# Patient Record
Sex: Male | Born: 1975 | Race: White | Hispanic: No | Marital: Married | Smoking: Former smoker
Health system: Southern US, Community
[De-identification: ages and names within clinical notes are randomized; demographics above are authoritative.]

## PROBLEM LIST (undated history)

## (undated) DIAGNOSIS — C187 Malignant neoplasm of sigmoid colon: Secondary | ICD-10-CM

## (undated) DIAGNOSIS — A389 Scarlet fever, uncomplicated: Secondary | ICD-10-CM

## (undated) DIAGNOSIS — E119 Type 2 diabetes mellitus without complications: Secondary | ICD-10-CM

## (undated) DIAGNOSIS — Z800 Family history of malignant neoplasm of digestive organs: Secondary | ICD-10-CM

## (undated) HISTORY — DX: Malignant neoplasm of sigmoid colon: C18.7

## (undated) HISTORY — DX: Type 2 diabetes mellitus without complications: E11.9

## (undated) HISTORY — DX: Family history of malignant neoplasm of digestive organs: Z80.0

## (undated) HISTORY — PX: VASECTOMY: SHX75

## (undated) HISTORY — DX: Scarlet fever, uncomplicated: A38.9

---

## 2002-07-09 ENCOUNTER — Emergency Department (HOSPITAL_COMMUNITY): Admission: EM | Admit: 2002-07-09 | Discharge: 2002-07-09 | Payer: Self-pay | Admitting: Emergency Medicine

## 2002-07-09 ENCOUNTER — Encounter: Payer: Self-pay | Admitting: Emergency Medicine

## 2004-02-19 ENCOUNTER — Emergency Department (HOSPITAL_COMMUNITY): Admission: EM | Admit: 2004-02-19 | Discharge: 2004-02-19 | Payer: Self-pay | Admitting: Family Medicine

## 2009-06-20 ENCOUNTER — Encounter: Admission: RE | Admit: 2009-06-20 | Discharge: 2009-06-20 | Payer: Self-pay | Admitting: Family Medicine

## 2014-12-16 ENCOUNTER — Emergency Department (HOSPITAL_COMMUNITY): Payer: PRIVATE HEALTH INSURANCE

## 2014-12-16 ENCOUNTER — Emergency Department (HOSPITAL_COMMUNITY)
Admission: EM | Admit: 2014-12-16 | Discharge: 2014-12-16 | Disposition: A | Discharge status: Home / Self Care | Payer: PRIVATE HEALTH INSURANCE | Attending: Emergency Medicine | Admitting: Emergency Medicine

## 2014-12-16 ENCOUNTER — Encounter (HOSPITAL_COMMUNITY): Payer: Self-pay | Admitting: Emergency Medicine

## 2014-12-16 DIAGNOSIS — K5732 Diverticulitis of large intestine without perforation or abscess without bleeding: Secondary | ICD-10-CM | POA: Insufficient documentation

## 2014-12-16 DIAGNOSIS — R1031 Right lower quadrant pain: Secondary | ICD-10-CM

## 2014-12-16 LAB — COMPREHENSIVE METABOLIC PANEL
ALBUMIN: 3.7 g/dL (ref 3.5–5.0)
ALK PHOS: 46 U/L (ref 38–126)
ALT: 17 U/L (ref 17–63)
AST: 25 U/L (ref 15–41)
Anion gap: 9 (ref 5–15)
BUN: 9 mg/dL (ref 6–20)
CALCIUM: 9 mg/dL (ref 8.9–10.3)
CO2: 24 mmol/L (ref 22–32)
CREATININE: 0.93 mg/dL (ref 0.61–1.24)
Chloride: 102 mmol/L (ref 101–111)
GFR calc Af Amer: >60 mL/min (ref >60)
GFR calc non Af Amer: >60 mL/min (ref >60)
GLUCOSE: 136 mg/dL — AB (ref 65–99)
Potassium: 3.8 mmol/L (ref 3.5–5.1)
SODIUM: 135 mmol/L (ref 135–145)
Total Bilirubin: 1.3 mg/dL — ABNORMAL HIGH (ref 0.3–1.2)
Total Protein: 7.1 g/dL (ref 6.5–8.1)

## 2014-12-16 LAB — URINALYSIS, ROUTINE W REFLEX MICROSCOPIC
Bilirubin Urine: NEGATIVE
Glucose, UA: NEGATIVE mg/dL
Hgb urine dipstick: NEGATIVE
Ketones, ur: NEGATIVE mg/dL
Leukocytes, UA: NEGATIVE
Nitrite: NEGATIVE
Protein, ur: NEGATIVE mg/dL
Specific Gravity, Urine: 1.027 (ref 1.005–1.030)
Urobilinogen, UA: 1 mg/dL (ref 0.0–1.0)
pH: 6 (ref 5.0–8.0)

## 2014-12-16 LAB — CBC WITH DIFFERENTIAL/PLATELET
Basophils Absolute: 0 10*3/uL (ref 0.0–0.1)
Basophils Relative: 0 %
Eosinophils Absolute: 0 10*3/uL (ref 0.0–0.7)
Eosinophils Relative: 0 %
HCT: 44.2 % (ref 39.0–52.0)
Hemoglobin: 15.5 g/dL (ref 13.0–17.0)
Lymphocytes Relative: 6 %
Lymphs Abs: 0.7 10*3/uL (ref 0.7–4.0)
MCH: 30.8 pg (ref 26.0–34.0)
MCHC: 35.1 g/dL (ref 30.0–36.0)
MCV: 87.9 fL (ref 78.0–100.0)
Monocytes Absolute: 0.8 10*3/uL (ref 0.1–1.0)
Monocytes Relative: 7 %
Neutro Abs: 9.6 10*3/uL — ABNORMAL HIGH (ref 1.7–7.7)
Neutrophils Relative %: 87 %
Platelets: 215 10*3/uL (ref 150–400)
RBC: 5.03 MIL/uL (ref 4.22–5.81)
RDW: 12.7 % (ref 11.5–15.5)
WBC: 11.1 10*3/uL — ABNORMAL HIGH (ref 4.0–10.5)

## 2014-12-16 LAB — LIPASE, BLOOD: Lipase: 37 U/L (ref 22–51)

## 2014-12-16 MED ORDER — ONDANSETRON HCL 4 MG PO TABS: 4.0000 mg | ORAL_TABLET | Freq: Four times a day (QID) | ORAL | Status: DC

## 2014-12-16 MED ORDER — ONDANSETRON HCL 4 MG/2ML IJ SOLN
4.0000 mg | Freq: Once | INTRAMUSCULAR | Status: AC
  Administered 2014-12-16: 4 mg via INTRAVENOUS
  Filled 2014-12-16: qty 2

## 2014-12-16 MED ORDER — METRONIDAZOLE 500 MG PO TABS
500.0000 mg | ORAL_TABLET | Freq: Once | ORAL | Status: AC
  Administered 2014-12-16: 500 mg via ORAL
  Filled 2014-12-16: qty 1

## 2014-12-16 MED ORDER — IOHEXOL 300 MG/ML  SOLN
100.0000 mL | Freq: Once | INTRAMUSCULAR | Status: AC | PRN
  Administered 2014-12-16: 100 mL via INTRAVENOUS

## 2014-12-16 MED ORDER — CIPROFLOXACIN HCL 500 MG PO TABS: 500.0000 mg | ORAL_TABLET | Freq: Two times a day (BID) | ORAL | Status: DC

## 2014-12-16 MED ORDER — METRONIDAZOLE 500 MG PO TABS: 500.0000 mg | ORAL_TABLET | Freq: Two times a day (BID) | ORAL | Status: DC

## 2014-12-16 MED ORDER — CIPROFLOXACIN HCL 500 MG PO TABS
500.0000 mg | ORAL_TABLET | Freq: Once | ORAL | Status: AC
  Administered 2014-12-16: 500 mg via ORAL
  Filled 2014-12-16: qty 1

## 2014-12-16 NOTE — Discharge Instructions (Signed)
Diverticulitis Diverticulitis is when small pockets that have formed in your colon (large intestine) become infected or swollen. HOME CARE  Follow your doctor's instructions.  Follow a special diet if told by your doctor.  When you feel better, your doctor may tell you to change your diet. You may be told to eat a lot of fiber. Fruits and vegetables are good sources of fiber. Fiber makes it easier to poop (have bowel movements).  Take supplements or probiotics as told by your doctor.  Only take medicines as told by your doctor.  Keep all follow-up visits with your doctor. GET HELP IF:  Your pain does not get better.  You have a hard time eating food.  You are not pooping like normal. GET HELP RIGHT AWAY IF:  Your pain gets worse.  Your problems do not get better.  Your problems suddenly get worse.  You have a fever.  You keep throwing up (vomiting).  You have bloody or black, tarry poop (stool). MAKE SURE YOU:   Understand these instructions.  Will watch your condition.  Will get help right away if you are not doing well or get worse. Document Released: 09/03/2007 Document Revised: 03/22/2013 Document Reviewed: 02/09/2013 [facility] Patient Information 2015 [place], [place]. This information is not intended to replace advice given to you by your health care provider. Make sure you discuss any questions you have with your health care provider.  Please retest information, follow-up immediately if new or worsening signs or symptoms present. Please follow-up with [facility] in 3 days for reevaluation.

## 2014-12-16 NOTE — ED Notes (Signed)
CT notified patient finished with contrast 

## 2014-12-16 NOTE — ED Notes (Signed)
Pt c/o abdominal pain and nausea onset Tuesday.

## 2014-12-16 NOTE — ED Provider Notes (Signed)
CSN: [id number]     Arrival date & time 12/16/14  1035 History   First MD Initiated Contact with Patient 12/16/14 1102     Chief Complaint  Patient presents with  . Abdominal Pain    HPI   39 year old male with no significant past medical history presents today with right lower quadrant abdominal pain. Patient reports symptoms started on Tuesday ( 5 days ago), with right lower quadrant "pressure". He reports the symptoms began approximately 30 minutes after eating food, or severe and lasted several hours. He reports symptoms subside and is asymptomatic with the exception of certain body movements and palpation of the right lower quadrant. Patient reports he also has significant pain with bowel movements, and urination. Patient states that every time he eats symptoms present. He notes that yesterday he spiked a fever in the low 100s, and has been using Motrin since then. Patient denies any headache, chest pain, upper abdominal pain, vomiting, changes in the color clarity or characteristics of his urine. Patient denies diarrhea or abnormal bowel movements. Patient reports he's never had any symptoms similar to this, no history of abdominal surgeries. Patient takes no daily medications.  History reviewed. No pertinent past medical history. History reviewed. No pertinent past surgical history. No family history on file. Social History  Substance Use Topics  . Smoking status: Never Smoker   . Smokeless tobacco: None  . Alcohol Use: No    Review of Systems  All other systems reviewed and are negative.   Allergies  Review of patient's allergies indicates no known allergies.  Home Medications   Prior to Admission medications   Medication Sig Start Date End Date Taking? Authorizing Wiley Flicker  ibuprofen (ADVIL,MOTRIN) 100 MG chewable tablet Chew 200 mg by mouth every 8 (eight) hours as needed for mild pain.   Yes Historical Jacklyn Branan, MD  ciprofloxacin (CIPRO) 500 MG tablet Take 1 tablet (500 mg  total) by mouth every 12 (twelve) hours. 12/16/14   Okey Regal, PA-C  metroNIDAZOLE (FLAGYL) 500 MG tablet Take 1 tablet (500 mg total) by mouth 2 (two) times daily. 12/16/14   Dellis Filbert Hedges, PA-C  ondansetron (ZOFRAN) 4 MG tablet Take 1 tablet (4 mg total) by mouth every 6 (six) hours. 12/16/14   Jeffrey Hedges, PA-C   BP 109/78 mmHg  Pulse 80  Temp(Src) 99.5 F (37.5 C) (Oral)  Resp 18  Ht 5\' 11"  (1.803 m)  Wt 228 lb (103.42 kg)  BMI 31.81 kg/m2  SpO2 100%   Physical Exam  Constitutional: He is oriented to person, place, and time. He appears well-developed and well-nourished.  HENT:  Head: Normocephalic and atraumatic.  Eyes: Conjunctivae are normal. Pupils are equal, round, and reactive to light. Right eye exhibits no discharge. Left eye exhibits no discharge. No scleral icterus.  Neck: Normal range of motion. No JVD present. No tracheal deviation present.  Cardiovascular: Regular rhythm, normal heart sounds and intact distal pulses.  Exam reveals no gallop and no friction rub.   No murmur heard. Pulmonary/Chest: Effort normal and breath sounds normal. No stridor. No respiratory distress. He has no wheezes. He has no rales. He exhibits no tenderness.  Abdominal: Soft. Bowel sounds are normal. He exhibits no distension and no mass. There is no hepatosplenomegaly, splenomegaly or hepatomegaly. There is tenderness in the right lower quadrant. There is tenderness at McBurney's point. There is no rigidity, no rebound, no guarding, no CVA tenderness and negative Murphy's sign. No hernia.  Neurological: He is alert and oriented to  person, place, and time. Coordination normal.  Skin: Skin is warm and dry.  Psychiatric: He has a normal mood and affect. His behavior is normal. Judgment and thought content normal.  Nursing note and vitals reviewed.   ED Course  Procedures (including critical care time) Labs Review Labs Reviewed  COMPREHENSIVE METABOLIC PANEL - Abnormal; Notable for the  following:    Glucose, Bld 136 (*)    Total Bilirubin 1.3 (*)    All other components within normal limits  CBC WITH DIFFERENTIAL/PLATELET - Abnormal; Notable for the following:    WBC 11.1 (*)    Neutro Abs 9.6 (*)    All other components within normal limits  LIPASE, BLOOD  URINALYSIS, ROUTINE W REFLEX MICROSCOPIC (NOT AT [facility])    Imaging Review Ct Abdomen Pelvis W Contrast  12/16/2014   CLINICAL DATA:  Right lower quadrant pain starting Tuesday, fever, nausea  EXAM: CT ABDOMEN AND PELVIS WITH CONTRAST  TECHNIQUE: Multidetector CT imaging of the abdomen and pelvis was performed using the standard protocol following bolus administration of intravenous contrast.  CONTRAST:  171mL OMNIPAQUE IOHEXOL 300 MG/ML  SOLN  COMPARISON:  None.  FINDINGS: Lung bases are unremarkable. Sagittal images of the spine shows degenerative changes thoracolumbar spine.  Liver, spleen, pancreas and adrenal glands are unremarkable. No aortic aneurysm. No calcified gallstones are noted within gallbladder.  Kidneys are symmetrical in size and enhancement. No hydronephrosis or hydroureter. There is a cyst in midpole of the right kidney measures 8 mm. Punctate nonobstructive calculus midpole of the left kidney measures 2.5 mm. Normal appendix noted in axial image 44. No pericecal inflammation. No small bowel obstruction. No ascites or free air. No adenopathy.  In axial image 80 there is focal thickening of colonic wall in mid sigmoid colon just above the urinary bladder. Significant stranding of surrounding fat. Findings highly suspicious for focal colitis or diverticulitis. Neoplastic process cannot be entirely excluded. Follow-up colonoscopy after appropriate treatment is recommended. A lymph node in adjacent mesentery right pelvis measures 1.2 cm. This may be reactive in nature. Second lymph node in right mesentery measures 9 mm.  Delayed renal images shows bilateral renal symmetrical excretion. Bilateral visualized proximal  ureter is unremarkable. The urinary bladder is unremarkable. Prostate gland and seminal vesicles are unremarkable.  IMPRESSION: 1. There is focal inflammatory process in mid sigmoid colon just above the urinary bladder. There is focal thickening of colonic wall and stranding of surrounding fat. Borderline enlarged adjacent mesenteric lymph node. Small amount of adjacent pericolonic fluid. Findings are highly suspicious for focal colitis or diverticulitis. Neoplastic process cannot be entirely excluded. Follow-up colonoscopy after appropriate treatment is recommended. 2. Normal appendix.  No pericecal inflammation. 3. Unremarkable urinary bladder.  Prostate gland is unremarkable. 4. There is left nonobstructive nephrolithiasis. No hydronephrosis or hydroureter.   Electronically Signed   By: Lahoma Crocker M.D.   On: 12/16/2014 14:00   I have personally reviewed and evaluated these images and lab results as part of my medical decision-making.   EKG Interpretation None      MDM   Final diagnoses:  RLQ abdominal pain  Diverticulitis of large intestine without perforation or abscess without bleeding    Labs: CBC, CMP, urinalysis- no significant findings  Imaging: CT abdomen and pelvis for results shown above- most likely represents diverticulitis without perforation or abscess  Consults:  Therapeutics: Zofran, metronidazole, Cipro  Discharge Meds: Zofran, metronidazole, Cipro  Assessment/Plan: Patient's presentation most likely represents diverticulitis without perforation or abscess. Patient will  be discharged home with oral medications, he tolerated by mouth antibiotics here in the ED. He is nontoxic, afebrile at present, vital signs are reassuring. Patient is instructed to take full course of medication, monitor for new worsening signs or symptoms, return immediately if any present. Is encouraged contact [facility] Monday morning to schedule follow-up evaluation and 3-5 days.  Patient and his wife both verbalized their understanding and agreement for today's plan and had no further questions or concerns at the time of discharge.         Okey Regal, PA-C 12/16/14 [place], MD 12/16/14 [phone]

## 2015-07-05 ENCOUNTER — Encounter (HOSPITAL_COMMUNITY): Payer: Self-pay | Admitting: Emergency Medicine

## 2015-07-05 ENCOUNTER — Emergency Department (HOSPITAL_COMMUNITY)
Admission: EM | Admit: 2015-07-05 | Discharge: 2015-07-05 | Disposition: A | Discharge status: Home / Self Care | Payer: PRIVATE HEALTH INSURANCE | Attending: Emergency Medicine | Admitting: Emergency Medicine

## 2015-07-05 ENCOUNTER — Emergency Department (HOSPITAL_COMMUNITY): Payer: PRIVATE HEALTH INSURANCE

## 2015-07-05 DIAGNOSIS — R109 Unspecified abdominal pain: Secondary | ICD-10-CM | POA: Diagnosis present

## 2015-07-05 DIAGNOSIS — M545 Low back pain: Secondary | ICD-10-CM | POA: Diagnosis not present

## 2015-07-05 DIAGNOSIS — K5732 Diverticulitis of large intestine without perforation or abscess without bleeding: Secondary | ICD-10-CM

## 2015-07-05 DIAGNOSIS — Z792 Long term (current) use of antibiotics: Secondary | ICD-10-CM | POA: Insufficient documentation

## 2015-07-05 LAB — COMPREHENSIVE METABOLIC PANEL
ALK PHOS: 42 U/L (ref 38–126)
ALT: 16 U/L — ABNORMAL LOW (ref 17–63)
ANION GAP: 10 (ref 5–15)
AST: 18 U/L (ref 15–41)
Albumin: 3.4 g/dL — ABNORMAL LOW (ref 3.5–5.0)
BUN: 8 mg/dL (ref 6–20)
CALCIUM: 9.3 mg/dL (ref 8.9–10.3)
CHLORIDE: 100 mmol/L — AB (ref 101–111)
CO2: 28 mmol/L (ref 22–32)
Creatinine, Ser: 0.92 mg/dL (ref 0.61–1.24)
GFR calc non Af Amer: >60 mL/min (ref >60)
Glucose, Bld: 172 mg/dL — ABNORMAL HIGH (ref 65–99)
POTASSIUM: 3.9 mmol/L (ref 3.5–5.1)
SODIUM: 138 mmol/L (ref 135–145)
Total Bilirubin: 0.9 mg/dL (ref 0.3–1.2)
Total Protein: 7.2 g/dL (ref 6.5–8.1)

## 2015-07-05 LAB — CBC
HCT: 40.4 % (ref 39.0–52.0)
HEMOGLOBIN: 13.5 g/dL (ref 13.0–17.0)
MCH: 28.3 pg (ref 26.0–34.0)
MCHC: 33.4 g/dL (ref 30.0–36.0)
MCV: 84.7 fL (ref 78.0–100.0)
Platelets: 297 10*3/uL (ref 150–400)
RBC: 4.77 MIL/uL (ref 4.22–5.81)
RDW: 12.6 % (ref 11.5–15.5)
WBC: 11 10*3/uL — ABNORMAL HIGH (ref 4.0–10.5)

## 2015-07-05 LAB — URINALYSIS, ROUTINE W REFLEX MICROSCOPIC
Bilirubin Urine: NEGATIVE
Glucose, UA: NEGATIVE mg/dL
HGB URINE DIPSTICK: NEGATIVE
Ketones, ur: NEGATIVE mg/dL
Leukocytes, UA: NEGATIVE
NITRITE: NEGATIVE
Protein, ur: NEGATIVE mg/dL
SPECIFIC GRAVITY, URINE: 1.005 (ref 1.005–1.030)
pH: 6.5 (ref 5.0–8.0)

## 2015-07-05 LAB — LIPASE, BLOOD: LIPASE: 26 U/L (ref 11–51)

## 2015-07-05 MED ORDER — IOPAMIDOL (ISOVUE-300) INJECTION 61%
100.0000 mL | Freq: Once | INTRAVENOUS | Status: AC | PRN
  Administered 2015-07-05: 100 mL via INTRAVENOUS

## 2015-07-05 MED ORDER — SODIUM CHLORIDE 0.9 % IV BOLUS (SEPSIS)
1000.0000 mL | Freq: Once | INTRAVENOUS | Status: AC
  Administered 2015-07-05: 1000 mL via INTRAVENOUS

## 2015-07-05 MED ORDER — CIPROFLOXACIN HCL 500 MG PO TABS: 500.0000 mg | ORAL_TABLET | Freq: Two times a day (BID) | ORAL | Status: DC

## 2015-07-05 MED ORDER — ACETAMINOPHEN 325 MG PO TABS
650.0000 mg | ORAL_TABLET | Freq: Once | ORAL | Status: AC
  Administered 2015-07-05: 650 mg via ORAL
  Filled 2015-07-05: qty 2

## 2015-07-05 MED ORDER — METRONIDAZOLE 500 MG PO TABS
500.0000 mg | ORAL_TABLET | Freq: Once | ORAL | Status: AC
  Administered 2015-07-05: 500 mg via ORAL
  Filled 2015-07-05: qty 1

## 2015-07-05 MED ORDER — CIPROFLOXACIN HCL 500 MG PO TABS
500.0000 mg | ORAL_TABLET | Freq: Once | ORAL | Status: AC
  Administered 2015-07-05: 500 mg via ORAL
  Filled 2015-07-05: qty 1

## 2015-07-05 MED ORDER — METRONIDAZOLE 500 MG PO TABS: 500.0000 mg | ORAL_TABLET | Freq: Two times a day (BID) | ORAL | Status: DC

## 2015-07-05 NOTE — ED Notes (Signed)
Provider at bedside updating the patient

## 2015-07-05 NOTE — ED Notes (Signed)
Pt reports mid lower back pain x 3 days followed by RLQ abd pain. Pt alert x4. NAD at this time.

## 2015-07-05 NOTE — ED Provider Notes (Signed)
CSN: [id number]     Arrival date & time 07/05/15  1507 History   First MD Initiated Contact with Patient 07/05/15 1954     Chief Complaint  Patient presents with  . Back Pain  . Abdominal Pain     (Consider location/radiation/quality/duration/timing/severity/associated sxs/prior Treatment) HPI  40 year old male presents with abdominal pain. Patient states he started having low back pain across his entire low back 4 days ago. This morning when he woke up he also had concomitant abdominal pain. Today he has been having some nausea but no vomiting. Had a low-grade temperature of 99.7. When he went to urgent care he said the pain was worst in his right lower abdomen. Has had diarrhea on and off for about one week. No urinary symptoms. Currently pain is 5/10. Took a leave earlier and seemed to help.   History reviewed. No pertinent past medical history. History reviewed. No pertinent past surgical history. No family history on file. Social History  Substance Use Topics  . Smoking status: Never Smoker   . Smokeless tobacco: None  . Alcohol Use: No    Review of Systems  Constitutional: Negative for fever.  Gastrointestinal: Positive for nausea, abdominal pain and diarrhea. Negative for vomiting.  Musculoskeletal: Positive for back pain.  All other systems reviewed and are negative.     Allergies  Review of patient's allergies indicates no known allergies.  Home Medications   Prior to Admission medications   Medication Sig Start Date End Date Taking? Authorizing Provider  ciprofloxacin (CIPRO) 500 MG tablet Take 1 tablet (500 mg total) by mouth every 12 (twelve) hours. 12/16/14   Okey Regal, PA-C  ibuprofen (ADVIL,MOTRIN) 100 MG chewable tablet Chew 200 mg by mouth every 8 (eight) hours as needed for mild pain.    Historical Provider, MD  metroNIDAZOLE (FLAGYL) 500 MG tablet Take 1 tablet (500 mg total) by mouth 2 (two) times daily. 12/16/14   Dellis Filbert Hedges, PA-C  ondansetron  (ZOFRAN) 4 MG tablet Take 1 tablet (4 mg total) by mouth every 6 (six) hours. 12/16/14   Jeffrey Hedges, PA-C   BP 124/89 mmHg  Pulse 86  Temp(Src) 98.5 F (36.9 C) (Oral)  Resp 16  Ht 5\' 11"  (1.803 m)  Wt 251 lb (113.853 kg)  BMI 35.02 kg/m2  SpO2 100% Physical Exam  Constitutional: He is oriented to person, place, and time. He appears well-developed and well-nourished.  HENT:  Head: Normocephalic and atraumatic.  Right Ear: External ear normal.  Left Ear: External ear normal.  Nose: Nose normal.  Eyes: Right eye exhibits no discharge. Left eye exhibits no discharge.  Neck: Neck supple.  Cardiovascular: Normal rate, regular rhythm, normal heart sounds and intact distal pulses.   Pulmonary/Chest: Effort normal and breath sounds normal.  Abdominal: Soft. There is tenderness in the right upper quadrant and right lower quadrant.  Musculoskeletal: He exhibits no edema.  No lower back tenderness  Neurological: He is alert and oriented to person, place, and time.  Skin: Skin is warm and dry.  Nursing note and vitals reviewed.   ED Course  Procedures (including critical care time) Labs Review Labs Reviewed  COMPREHENSIVE METABOLIC PANEL - Abnormal; Notable for the following:    Chloride 100 (*)    Glucose, Bld 172 (*)    Albumin 3.4 (*)    ALT 16 (*)    All other components within normal limits  CBC - Abnormal; Notable for the following:    WBC 11.0 (*)  All other components within normal limits  LIPASE, BLOOD  URINALYSIS, ROUTINE W REFLEX MICROSCOPIC (NOT AT [facility])    Imaging Review Ct Abdomen Pelvis W Contrast  07/05/2015  CLINICAL DATA:  Right-sided abdominal pain, onset this morning. EXAM: CT ABDOMEN AND PELVIS WITH CONTRAST TECHNIQUE: Multidetector CT imaging of the abdomen and pelvis was performed using the standard protocol following bolus administration of intravenous contrast. CONTRAST:  164mL ISOVUE-300 IOPAMIDOL (ISOVUE-300) INJECTION 61% COMPARISON:  None.  FINDINGS: Lower chest:  No significant abnormality Hepatobiliary: There are normal appearances of the liver, gallbladder and bile ducts. Pancreas: Normal Spleen: Normal Adrenals/Urinary Tract: The adrenals are normal. There is an unchanged 9 mm cyst at the lateral aspect of the right renal midpole. There are punctate calculi of both renal collecting systems, measuring about 2 mm. Ureters and urinary bladder are unremarkable. Stomach/Bowel: Stomach and small bowel are normal. Appendix is normal. There is marked mural thickening of a 9 cm segment of distal sigmoid colon with adjacent mass-like stranding in the sigmoid mesentery and with numerous prominent local nodes. This is the same location which was abnormal on the 12/16/2014 examination. Today, the mural thickening is much more prominent and irregular, and the adjacent soft tissue abnormality in the mesentery is more mass-like. A few prominent nodes are also present in the para-aortic retroperitoneum and in the porta hepatis. There is no bowel obstruction. There is no free air. There is no ascites. Vascular/Lymphatic: The abdominal aorta is normal in caliber. There is no atherosclerotic calcification. There is no adenopathy in the abdomen or pelvis. Reproductive: Unremarkable Other: Musculoskeletal: No significant skeletal lesions IMPRESSION: 1. Markedly abnormal 9 cm segment of distal sigmoid colon, with adjacent soft tissue mass or cicatrization in the mesentery. Local nodes are enlarged, and there are prominent nodes in the paraaortic and porta hepatis regions. Although this might represent complicated sigmoid diverticulitis, it is quite suspicious for neoplasm. Carcinoid or lymphoma should be considered in addition to adenocarcinoma. 2. No obstruction, free air, or ascites.  No drainable abscess. 3. No hepatic or lung base metastases. 4. These results were called by telephone at the time of interpretation on 07/05/2015 at 9:46 pm to Dr. Sherwood Gambler , who  verbally acknowledged these results. Electronically Signed   By: Andreas Newport M.D.   On: 07/05/2015 21:57   I have personally reviewed and evaluated these images and lab results as part of my medical decision-making.   EKG Interpretation None      MDM   Final diagnoses:  Sigmoid diverticulitis    CT shows recurrent sigmoid diverticulitis. Patient is quite well-appearing with normal vital signs. Moderate pain but he requests nothing stronger than Tylenol at this time. His pain seems to be well controlled. Radiology is concerned about a possible mass causing this. He had similar diverticulitis about one year ago. I discussed with GI on call, Dr. Carlean Purl, who will follow the patient up as an outpatient. Discussed the importance of follow-up and colonoscopy with patient and will give oral antibiotics. He requests no narcotics and wants to be treated with Tylenol and ibuprofen. Discussed strict return precautions. Discussed concern of possible mass with patient.    Sherwood Gambler, MD 07/06/15 [phone]

## 2015-07-05 NOTE — ED Notes (Signed)
Patient able to ambulate independently  

## 2015-07-05 NOTE — Discharge Instructions (Signed)
It is very important to follow up with the GI specialist (Dr. Carlean Purl). Return to the ER if your symptoms worsen or do not improve

## 2015-07-06 ENCOUNTER — Telehealth: Payer: Self-pay | Admitting: Internal Medicine

## 2015-07-06 NOTE — Telephone Encounter (Signed)
Patient called and left a voicemail confirming appt for Monday

## 2015-07-06 NOTE — Telephone Encounter (Signed)
I placed him on the schedule for Monday 07/09/15 9:00 with Dr. Fuller Plan I left a detailed message about the appt.  I asked that he call to confirm or call me to reschedule.

## 2015-07-06 NOTE — Telephone Encounter (Signed)
Mass on CT needs appt w/ [place] - sigmoid mass

## 2015-07-09 ENCOUNTER — Encounter: Payer: Self-pay | Admitting: Gastroenterology

## 2015-07-09 ENCOUNTER — Ambulatory Visit (INDEPENDENT_AMBULATORY_CARE_PROVIDER_SITE_OTHER): Payer: PRIVATE HEALTH INSURANCE | Admitting: Gastroenterology

## 2015-07-09 VITALS — BP 118/80 | HR 76 | Ht 70.5 in | Wt 251.0 lb

## 2015-07-09 DIAGNOSIS — K5732 Diverticulitis of large intestine without perforation or abscess without bleeding: Secondary | ICD-10-CM

## 2015-07-09 DIAGNOSIS — R933 Abnormal findings on diagnostic imaging of other parts of digestive tract: Secondary | ICD-10-CM

## 2015-07-09 MED ORDER — NA SULFATE-K SULFATE-MG SULF 17.5-3.13-1.6 GM/177ML PO SOLN: 1.0000 | Freq: Once | ORAL | Status: DC

## 2015-07-09 MED ORDER — CIPROFLOXACIN HCL 500 MG PO TABS: 500.0000 mg | ORAL_TABLET | Freq: Two times a day (BID) | ORAL | Status: DC

## 2015-07-09 MED ORDER — METRONIDAZOLE 500 MG PO TABS: 500.0000 mg | ORAL_TABLET | Freq: Two times a day (BID) | ORAL | Status: DC

## 2015-07-09 NOTE — Patient Instructions (Signed)
We have sent the following medications to your pharmacy for you to pick up at your convenience:Cipro and Flagyl.  You have been scheduled for a colonoscopy. Please follow written instructions given to you at your visit today.  Please pick up your prep supplies at the pharmacy within the next 1-3 days. If you use inhalers (even only as needed), please bring them with you on the day of your procedure. Your physician has requested that you go to www.startemmi.com and enter the access code given to you at your visit today. This web site gives a general overview about your procedure. However, you should still follow specific instructions given to you by our office regarding your preparation for the procedure.  We have referred you to [facility] to see Dr. Darron Doom on 07/11/15 at 1:15pm. Please bring your insurance cards, photo ID and medication list. If you need to reschedule or cancel please contact there office at [phone].  Diverticulitis Diverticulitis is inflammation or infection of small pouches in your colon that form when you have a condition called diverticulosis. The pouches in your colon are called diverticula. Your colon, or large intestine, is where water is absorbed and stool is formed. Complications of diverticulitis can include:  Bleeding.  Severe infection.  Severe pain.  Perforation of your colon.  Obstruction of your colon. CAUSES  Diverticulitis is caused by bacteria. Diverticulitis happens when stool becomes trapped in diverticula. This allows bacteria to grow in the diverticula, which can lead to inflammation and infection. RISK FACTORS People with diverticulosis are at risk for diverticulitis. Eating a diet that does not include enough fiber from fruits and vegetables may make diverticulitis more likely to develop. SYMPTOMS  Symptoms of diverticulitis may include:  Abdominal pain and tenderness. The pain is normally located on the left side of the  abdomen, but may occur in other areas.  Fever and chills.  Bloating.  Cramping.  Nausea.  Vomiting.  Constipation.  Diarrhea.  Blood in your stool. DIAGNOSIS  Your health care provider will ask you about your medical history and do a physical exam. You may need to have tests done because many medical conditions can cause the same symptoms as diverticulitis. Tests may include:  Blood tests.  Urine tests.  Imaging tests of the abdomen, including X-rays and CT scans. When your condition is under control, your health care provider may recommend that you have a colonoscopy. A colonoscopy can show how severe your diverticula are and whether something else is causing your symptoms. TREATMENT  Most cases of diverticulitis are mild and can be treated at home. Treatment may include:  Taking over-the-counter pain medicines.  Following a clear liquid diet.  Taking antibiotic medicines by mouth for 7-10 days. More severe cases may be treated at a hospital. Treatment may include:  Not eating or drinking.  Taking prescription pain medicine.  Receiving antibiotic medicines through an IV tube.  Receiving fluids and nutrition through an IV tube.  Surgery. HOME CARE INSTRUCTIONS   Follow your health care provider's instructions carefully.  Follow a full liquid diet or other diet as directed by your health care provider. After your symptoms improve, your health care provider may tell you to change your diet. He or she may recommend you eat a high-fiber diet. Fruits and vegetables are good sources of fiber. Fiber makes it easier to pass stool.  Take fiber supplements or probiotics as directed by your health care provider.  Only take medicines as directed by your health  care provider.  Keep all your follow-up appointments. SEEK MEDICAL CARE IF:   Your pain does not improve.  You have a hard time eating food.  Your bowel movements do not return to normal. SEEK IMMEDIATE  MEDICAL CARE IF:   Your pain becomes worse.  Your symptoms do not get better.  Your symptoms suddenly get worse.  You have a fever.  You have repeated vomiting.  You have bloody or black, tarry stools. MAKE SURE YOU:   Understand these instructions.  Will watch your condition.  Will get help right away if you are not doing well or get worse.   This information is not intended to replace advice given to you by your health care provider. Make sure you discuss any questions you have with your health care provider.   Document Released: 12/25/2004 Document Revised: 03/22/2013 Document Reviewed: 02/09/2013 Elsevier Interactive Patient Education 2016 Elsevier Inc.   High-Fiber Diet Fiber, also called dietary fiber, is a type of carbohydrate found in fruits, vegetables, whole grains, and beans. A high-fiber diet can have many health benefits. Your health care provider may recommend a high-fiber diet to help:  Prevent constipation. Fiber can make your bowel movements more regular.  Lower your cholesterol.  Relieve hemorrhoids, uncomplicated diverticulosis, or irritable bowel syndrome.  Prevent overeating as part of a weight-loss plan.  Prevent heart disease, type 2 diabetes, and certain cancers. WHAT IS MY PLAN? The recommended daily intake of fiber includes:  38 grams for men under age 85.  51 grams for men over age 51.  64 grams for women under age 19.  69 grams for women over age 13. You can get the recommended daily intake of dietary fiber by eating a variety of fruits, vegetables, grains, and beans. Your health care provider may also recommend a fiber supplement if it is not possible to get enough fiber through your diet. WHAT DO I NEED TO KNOW ABOUT A HIGH-FIBER DIET?  Fiber supplements have not been widely studied for their effectiveness, so it is better to get fiber through food sources.  Always check the fiber content on thenutrition facts label of any  prepackaged food. Look for foods that contain at least 5 grams of fiber per serving.  Ask your dietitian if you have questions about specific foods that are related to your condition, especially if those foods are not listed in the following section.  Increase your daily fiber consumption gradually. Increasing your intake of dietary fiber too quickly may cause bloating, cramping, or gas.  Drink plenty of water. Water helps you to digest fiber. WHAT FOODS CAN I EAT? Grains Whole-grain breads. Multigrain cereal. Oats and oatmeal. Brown rice. Barley. Bulgur wheat. [place]. Bran muffins. Popcorn. Rye wafer crackers. Vegetables Sweet potatoes. Spinach. Kale. Artichokes. Cabbage. Broccoli. Green peas. Carrots. Squash. Fruits Berries. Pears. Apples. Oranges. Avocados. Prunes and raisins. Dried figs. Meats and Other Protein Sources Navy, kidney, pinto, and soy beans. Split peas. Lentils. Nuts and seeds. Dairy Fiber-fortified yogurt. Beverages Fiber-fortified soy milk. Fiber-fortified orange juice. Other Fiber bars. The items listed above may not be a complete list of recommended foods or beverages. Contact your dietitian for more options. WHAT FOODS ARE NOT RECOMMENDED? Grains White bread. Pasta made with refined flour. White rice. Vegetables Fried potatoes. Canned vegetables. Well-cooked vegetables.  Fruits Fruit juice. Cooked, strained fruit. Meats and Other Protein Sources Fatty cuts of meat. Fried Sales executive or fried fish. Dairy Milk. Yogurt. Cream cheese. Sour cream. Beverages Soft drinks. Other Cakes and pastries.  Butter and oils. The items listed above may not be a complete list of foods and beverages to avoid. Contact your dietitian for more information. WHAT ARE SOME TIPS FOR INCLUDING HIGH-FIBER FOODS IN MY DIET?  Eat a wide variety of high-fiber foods.  Make sure that half of all grains consumed each day are whole grains.  Replace breads and cereals made from refined flour  or white flour with whole-grain breads and cereals.  Replace white rice with brown rice, bulgur wheat, or millet.  Start the day with a breakfast that is high in fiber, such as a cereal that contains at least 5 grams of fiber per serving.  Use beans in place of meat in soups, salads, or pasta.  Eat high-fiber snacks, such as berries, raw vegetables, nuts, or popcorn.   This information is not intended to replace advice given to you by your health care provider. Make sure you discuss any questions you have with your health care provider.   Document Released: 03/17/2005 Document Revised: 04/07/2014 Document Reviewed: 08/30/2013 Elsevier Interactive Patient Education 2016 Elsevier Inc.  Normal BMI (Body Mass Index- based on height and weight) is between 19 and 25. Your BMI today is Body mass index is 35.49 kg/(m^2). Marland Kitchen Please consider follow up  regarding your BMI with your Primary Care Provider.    Thank you for choosing me and [facility].  Pricilla Riffle. Dagoberto Ligas., MD., Marval Regal

## 2015-07-09 NOTE — Progress Notes (Signed)
    History of Present Illness: This is a 40 year old male referred by Sherwood Gambler, MD for the evaluation of an abnormal CT of the colon and RLQ pain. He is accompanied by his wife. He was diagnosed with sigmoid diverticulitis in 11/2014 in the ED and was treated with a course of Cipro and Flagyl and his symptoms resolved. Last week he developed low back pain and right lower quadrant pain and was seen in the [facility] ED for evaluation. CT scan as below. He was started on Cipro and Flagyl and his symptoms have substantially improved. His back pain has resolved. Residual right lower quadrant pain persists. He notes over the past several weeks his stools have been slightly looser has no other GI complaints. Denies weight loss, constipation, change in stool caliber, melena, hematochezia, nausea, vomiting, dysphagia, reflux symptoms, chest pain.   Abd/pelvic CT IMPRESSION 07/05/15: 1. Markedly abnormal 9 cm segment of distal sigmoid colon, with adjacent soft tissue mass or cicatrization in the mesentery. Local nodes are enlarged, and there are prominent nodes in the paraaortic and porta hepatis regions. Although this might represent complicated sigmoid diverticulitis, it is quite suspicious for neoplasm. Carcinoid or lymphoma should be considered in addition to adenocarcinoma. 2. No obstruction, free air, or ascites. No drainable abscess. 3. No hepatic or lung base metastases. 4. These results were called by telephone at the time of interpretation on 07/05/2015 at 9:46 pm to Dr. Sherwood Gambler , who verbally acknowledged these results.  Review of Systems: Pertinent positive and negative review of systems were noted in the above HPI section. All other review of systems were otherwise negative.  Current Medications, Allergies, Past Medical History, Past Surgical History, Family History and Social History were reviewed in [employer] record.  Physical Exam: General: Well developed, well  nourished, no acute distress Head: Normocephalic and atraumatic Eyes:  sclerae anicteric, EOMI Ears: Normal auditory acuity Mouth: No deformity or lesions Neck: Supple, no masses or thyromegaly Lungs: Clear throughout to auscultation Heart: Regular rate and rhythm; no murmurs, rubs or bruits Abdomen: Soft, non tender and non distended. No masses, hepatosplenomegaly or hernias noted. Normal Bowel sounds Rectal: deferred to colonoscopy  Musculoskeletal: Symmetrical with no gross deformities  Skin: No lesions on visible extremities Pulses:  Normal pulses noted Extremities: No clubbing, cyanosis, edema or deformities noted Neurological: Alert oriented x 4, grossly nonfocal Cervical Nodes:  No significant cervical adenopathy Inguinal Nodes: No significant inguinal adenopathy Psychological:  Alert and cooperative. Normal mood and affect  Assessment and Recommendations:  1. Recurrent sigmoid colon diverticulitis with an abnormal CT scan of the colon. CT findings likely represent diverticulitis however a mass lesion cannot be fully excluded. Plan for a 2 week course of Cipro and Flagyl. Long-term high fiber diet with adequate daily water intake. Schedule colonoscopy in about one month for further evaluation. The risks (including bleeding, perforation, infection, missed lesions, medication reactions and possible hospitalization or surgery if complications occur), benefits, and alternatives to colonoscopy with possible biopsy and possible polypectomy were discussed with the patient and they consent to proceed. Possibility of surgery was discussed with the patient if he has persistent problems with relapsing diverticulitis or has complicated diverticulitis.  cc: Sherwood Gambler, MD

## 2015-07-10 ENCOUNTER — Encounter: Payer: PRIVATE HEALTH INSURANCE | Admitting: Gastroenterology

## 2015-08-07 ENCOUNTER — Encounter: Payer: Self-pay | Admitting: Gastroenterology

## 2015-08-07 ENCOUNTER — Telehealth: Payer: Self-pay

## 2015-08-07 ENCOUNTER — Ambulatory Visit (AMBULATORY_SURGERY_CENTER): Payer: PRIVATE HEALTH INSURANCE | Admitting: Gastroenterology

## 2015-08-07 VITALS — BP 130/87 | HR 61 | Temp 97.5°F | Resp 22 | Ht 70.0 in | Wt 251.0 lb

## 2015-08-07 DIAGNOSIS — R933 Abnormal findings on diagnostic imaging of other parts of digestive tract: Secondary | ICD-10-CM | POA: Diagnosis not present

## 2015-08-07 DIAGNOSIS — K635 Polyp of colon: Secondary | ICD-10-CM | POA: Diagnosis not present

## 2015-08-07 DIAGNOSIS — D125 Benign neoplasm of sigmoid colon: Secondary | ICD-10-CM

## 2015-08-07 DIAGNOSIS — C187 Malignant neoplasm of sigmoid colon: Secondary | ICD-10-CM | POA: Insufficient documentation

## 2015-08-07 DIAGNOSIS — D120 Benign neoplasm of cecum: Secondary | ICD-10-CM

## 2015-08-07 HISTORY — DX: Malignant neoplasm of sigmoid colon: C18.7

## 2015-08-07 LAB — GLUCOSE, CAPILLARY: GLUCOSE-CAPILLARY: 123 mg/dL — AB (ref 65–99)

## 2015-08-07 MED ORDER — SODIUM CHLORIDE 0.9 % IV SOLN: 500.0000 mL | INTRAVENOUS | Status: DC

## 2015-08-07 NOTE — Progress Notes (Signed)
Called to room to assist during endoscopic procedure.  Patient ID and intended procedure confirmed with present staff. Received instructions for my participation in the procedure from the performing physician.  

## 2015-08-07 NOTE — Progress Notes (Signed)
To pacu  Pt alert and awake. Report to RN

## 2015-08-07 NOTE — Patient Instructions (Signed)
YOU HAD AN ENDOSCOPIC PROCEDURE TODAY AT [facility]:   Refer to the procedure report that was given to you for any specific questions about what was found during the examination.  If the procedure report does not answer your questions, please call your gastroenterologist to clarify.  If you requested that your care partner not be given the details of your procedure findings, then the procedure report has been included in a sealed envelope for you to review at your convenience later.  YOU SHOULD EXPECT: Some feelings of bloating in the abdomen. Passage of more gas than usual.  Walking can help get rid of the air that was put into your GI tract during the procedure and reduce the bloating. If you had a lower endoscopy (such as a colonoscopy or flexible sigmoidoscopy) you may notice spotting of blood in your stool or on the toilet paper. If you underwent a bowel prep for your procedure, you may not have a normal bowel movement for a few days.  Please Note:  You might notice some irritation and congestion in your nose or some drainage.  This is from the oxygen used during your procedure.  There is no need for concern and it should clear up in a day or so.  SYMPTOMS TO REPORT IMMEDIATELY:   Following lower endoscopy (colonoscopy or flexible sigmoidoscopy):  Excessive amounts of blood in the stool  Significant tenderness or worsening of abdominal pains  Swelling of the abdomen that is new, acute  Fever of 100F or higher   For urgent or emergent issues, a gastroenterologist can be reached at any hour by calling [phone].   DIET: Your first meal following the procedure should be a small meal and then it is ok to progress to your normal diet. Heavy or fried foods are harder to digest and may make you feel nauseous or bloated.  Likewise, meals heavy in dairy and vegetables can increase bloating.  Drink plenty of fluids but you should avoid alcoholic beverages for 24  hours.  ACTIVITY:  You should plan to take it easy for the rest of today and you should NOT DRIVE or use heavy machinery until tomorrow (because of the sedation medicines used during the test).    FOLLOW UP: Our staff will call the number listed on your records the next business day following your procedure to check on you and address any questions or concerns that you may have regarding the information given to you following your procedure. If we do not reach you, we will leave a message.  However, if you are feeling well and you are not experiencing any problems, there is no need to return our call.  We will assume that you have returned to your regular daily activities without incident.  If any biopsies were taken you will be contacted by phone or by letter within the next 1-3 weeks.  Please call us at [phone] if you have not heard about the biopsies in 3 weeks.    SIGNATURES/CONFIDENTIALITY: You and/or your care partner have signed paperwork which will be entered into your electronic medical record.  These signatures attest to the fact that that the information above on your After Visit Summary has been reviewed and is understood.  Full responsibility of the confidentiality of this discharge information lies with you and/or your care-partner.  No NSAIDS for two weeks per Dr. Fuller Plan. They include aspirin, ibuprofen and aleve.    The office will refer you to a  surgeon regarding your findings.  Do not hesitate to call us if you need [place].

## 2015-08-07 NOTE — Op Note (Signed)
[facility] Patient Name: Marc King Procedure Date: 08/07/2015 11:06 AM MRN: [id number] Endoscopist: Ladene Artist , MD Age: 40 Date of Birth: 1975-05-28 Gender: Male Procedure:                Colonoscopy Indications:              Evaluation on imaging study of clinically                            significant abnormality Medicines:                Monitored Anesthesia Care Procedure:                Pre-Anesthesia Assessment:                           - Prior to the procedure, a History and Physical                            was performed, and patient medications and                            allergies were reviewed. The patient's tolerance of                            previous anesthesia was also reviewed. The risks                            and benefits of the procedure and the sedation                            options and risks were discussed with the patient.                            All questions were answered, and informed consent                            was obtained. Prior Anticoagulants: The patient has                            taken no previous anticoagulant or antiplatelet                            agents. ASA Grade Assessment: II - A patient with                            mild systemic disease. After reviewing the risks                            and benefits, the patient was deemed in                            satisfactory condition to undergo the procedure.  After obtaining informed consent, the colonoscope                            was passed under direct vision. Throughout the                            procedure, the patient's blood pressure, pulse, and                            oxygen saturations were monitored continuously. The                            Model PCF-H190L [id number]) scope was introduced                            through the anus and advanced to the the cecum,   identified by appendiceal orifice and ileocecal                            valve. The colonoscopy was performed without                            difficulty. The patient tolerated the procedure                            well. The quality of the bowel preparation was                            good. The ileocecal valve, appendiceal orifice, and                            rectum were photographed. Scope In: 11:11:55 AM Scope Out: 11:38:15 AM Scope Withdrawal Time: 0 hours 21 minutes 56 seconds  Total Procedure Duration: 0 hours 26 minutes 20 seconds  Findings:                 A fungating partially obstructing mass was found in                            the sigmoid colon. The mass was circumferential.                            The mass measured six cm in length from 21-27 cm                            from the anal verge. Oozing was present. Biopsies                            were taken with a cold forceps for histology. Two                            areas were tattooed with an injection of 3 mL of  Spot (carbon black) 1 distal to the mass and 1                            proximal to the mass.                           A 8 mm polyp was found in the cecum. The polyp was                            sessile. The polyp was removed with a hot snare.                            Resection and retrieval were complete.                           The exam was otherwise normal throughout the                            examined colon.                           The retroflexed view of the distal rectum and anal                            verge was normal and showed no anal or rectal                            abnormalities. Complications:            No immediate complications. Estimated Blood Loss:     Estimated blood loss: none. 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 Biopsied. Tattooed.                            - One 8 mm polyp in the cecum, removed with a hot                            snare. Resected and retrieved. Recommendation:           - Patient has a contact number available for                            emergencies. The signs and symptoms of potential                            delayed complications were discussed with the                            patient. Return to normal activities tomorrow.  Written discharge instructions were provided to the                            patient.                           - Resume previous diet.                           - Continue present medications.                           - Avoid ASA/NSAIDs for 2 weeks                           - Await pathology results.                           - Repeat colonoscopy in 1 year for surveillance.                           - Refer to a surgeon at the next available                            appointment. Ladene Artist, MD 08/07/2015 11:55:06 AM This report has been signed electronically.

## 2015-08-07 NOTE — Telephone Encounter (Signed)
Patient notified of surgical referral to [facility] Dr. Jonita Albee on 08/21/15 9:10 arrival.  He verbalized understanding of appt dates, times, and locations.  He will call back for any additional questions or concerns.

## 2015-08-08 ENCOUNTER — Telehealth: Payer: Self-pay | Admitting: Gastroenterology

## 2015-08-08 ENCOUNTER — Telehealth: Payer: Self-pay | Admitting: *Deleted

## 2015-08-08 NOTE — Telephone Encounter (Signed)
Message left

## 2015-08-08 NOTE — Telephone Encounter (Signed)
Patient had a colonoscopy yesterday with polypectomy from cecum and colon mass in the sigmoid colon that was friable.  Patient reports 3 episodes today of blood in the stool.  He is not passing blood independent of stool.  Discussed with Dr. Fuller Plan.  Patient advised to rest, avoid all NSAIDS/ASA.  He is advised that a small amount of blood is not surprising, but if he has blood independent of stool then he needs to call back.

## 2015-08-08 NOTE — Telephone Encounter (Signed)
Pt returning sheri's call. °

## 2015-08-08 NOTE — Telephone Encounter (Signed)
Left message for patient to call back  

## 2015-08-21 ENCOUNTER — Other Ambulatory Visit: Payer: Self-pay | Admitting: General Surgery

## 2015-08-21 DIAGNOSIS — C187 Malignant neoplasm of sigmoid colon: Secondary | ICD-10-CM

## 2015-08-21 NOTE — H&P (Signed)
History of Present Illness Marc Ruff MD; [id number] 10:00 AM) The patient is a 40 year old male who presents with colorectal cancer. 40 year old male who presents to the office for evaluation of a newly diagnosed sigmoid colon cancer. He has been having some right lower quadrant pain over the past few months and a CT scan of the abdomen and pelvis was performed. This showed an inflamed portion of sigmoid colon concerning for diverticulitis versus neoplasm. He was placed on antibiotics and received a colonoscopy approximately 4 weeks later. Colonoscopy revealed a sigmoid colon mass and a cecal polyp. Cecal polyp was noted to be benign. Sigmoid mass was noted to be adenocarcinoma. The patient still has some crampy abdominal pain that is relieved with bowel movements. He has occasional blood in his stools but most of this was after his colonoscopy. He denies any weight loss. He has no past medical history or past surgical history. His only family history of colon cancer is in a great-grandmother.   Other Problems [name], [name]; 08/21/2015 9:27 AM) Back Pain  Past Surgical History [name], [name]; 08/21/2015 9:27 AM) Vasectomy  Diagnostic Studies History [name], [place]; 08/21/2015 9:27 AM) Colonoscopy within last year  Allergies [name], [name]; 08/21/2015 9:28 AM) No Known Drug Allergies 08/21/2015  Medication History [name], [name]; 08/21/2015 9:28 AM) No Current Medications Medications Reconciled  Social History [name], [name]; 08/21/2015 9:27 AM) Alcohol use Remotely quit alcohol use. Caffeine use Carbonated beverages, Coffee. Illicit drug use Remotely quit drug use. Tobacco use Former smoker.  Family History [name], [place]; 08/21/2015 9:27 AM) Cancer Mother. Diabetes Mellitus Mother, Sister. Hypertension Mother, Sister. Melanoma Father.     Review of Systems [name] [name]; 08/21/2015 9:27 AM) General Present- Fatigue. Not Present-  Appetite Loss, Chills, Fever, Night Sweats, Weight Gain and Weight Loss. Skin Not Present- Change in Wart/Mole, Dryness, Hives, Jaundice, New Lesions, Non-Healing Wounds, Rash and Ulcer. HEENT Not Present- Earache, Hearing Loss, Hoarseness, Nose Bleed, Oral Ulcers, Ringing in the Ears, Seasonal Allergies, Sinus Pain, Sore Throat, Visual Disturbances, Wears glasses/contact lenses and Yellow Eyes. Breast Not Present- Breast Mass, Breast Pain, Nipple Discharge and Skin Changes. Cardiovascular Not Present- Chest Pain, Difficulty Breathing Lying Down, Leg Cramps, Palpitations, Rapid Heart Rate, Shortness of Breath and Swelling of Extremities. Gastrointestinal Present- Abdominal Pain, Bloating, Bloody Stool, Change in Bowel Habits, Gets full quickly at meals and Nausea. Not Present- Chronic diarrhea, Constipation, Difficulty Swallowing, Excessive gas, Hemorrhoids, Indigestion, Rectal Pain and Vomiting. Male Genitourinary Not Present- Blood in Urine, Change in Urinary Stream, Frequency, Impotence, Nocturia, Painful Urination, Urgency and Urine Leakage. Musculoskeletal Present- Joint Pain. Not Present- Back Pain, Joint Stiffness, Muscle Pain, Muscle Weakness and Swelling of Extremities. Neurological Not Present- Decreased Memory, Fainting, Headaches, Numbness, Seizures, Tingling, Tremor, Trouble walking and Weakness. Psychiatric Not Present- Anxiety, Bipolar, Change in Sleep Pattern, Depression, Fearful and Frequent crying. Endocrine Present- New Diabetes. Not Present- Cold Intolerance, Excessive Hunger, Hair Changes, Heat Intolerance and Hot flashes. Hematology Not Present- Easy Bruising, Excessive bleeding, Gland problems, HIV and Persistent Infections.  Vitals [name] [name]; 08/21/2015 9:28 AM) 08/21/2015 9:28 AM Weight: 239 lb Height: 71in Body Surface Area: 2.27 m Body Mass Index: 33.33 kg/m  Temp.: 97.23F  Pulse: 80 (Regular)  BP: 130/76 (Sitting, Left Arm,  Standard)      Physical Exam Marc Ruff MD; [id number] 10:00 AM)  General Mental Status-Alert. General Appearance-Not in acute distress. Build & Nutrition-Well nourished. Posture-Normal posture. Gait-Normal.  Head and Neck Head-normocephalic, atraumatic with  no lesions or palpable masses. Trachea-midline.  Chest and Lung Exam Chest and lung exam reveals -on auscultation, normal breath sounds, no adventitious sounds and normal vocal resonance.  Cardiovascular Cardiovascular examination reveals -normal heart sounds, regular rate and rhythm with no murmurs.  Abdomen Inspection Inspection of the abdomen reveals - No Hernias. Palpation/Percussion Palpation and Percussion of the abdomen reveal - Soft, Non Tender, No Rigidity (guarding), No hepatosplenomegaly and No Palpable abdominal masses.  Neurologic Neurologic evaluation reveals -alert and oriented x 3 with no impairment of recent or remote memory, normal attention span and ability to concentrate, normal sensation and normal coordination.  Musculoskeletal Normal Exam - Bilateral-Upper Extremity Strength Normal and Lower Extremity Strength Normal.    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 CT scan concerning for neoplasm versus diverticulitis. Colonoscopy performed showed a sigmoid colon mass and a cecal polyp which was resected. Biopsy shows sigmoid adenocarcinoma. Patient has no signs of metastatic disease on his CT abdomen. We will get a CT scan of his chest. We will also get a CEA level with his preoperative labs to complete his work up. On CT scan the tumor does appear to be close to the right ureter with some inflammation around the area. I think that it will dissect off of this area easily but I have recommended placing a right ureteral stent  preoperatively to avoid any unknown injury. The surgery and anatomy were described to the patient as well as the risks of surgery and the possible complications. These include: Bleeding, deep abdominal infections and possible wound complications such as hernia and infection, damage to adjacent structures, leak of surgical connections, which can lead to other surgeries and possibly an ostomy, possible need for other procedures, such as abscess drains in radiology, possible prolonged hospital stay, possible diarrhea from removal of part of the colon, possible constipation from narcotics, possible bowel, bladder or sexual dysfunction if having rectal surgery, prolonged fatigue/weakness or appetite loss, possible early recurrence of of disease, possible complications of their medical problems such as heart disease or arrhythmias or lung problems, death (less than 1%). I believe the patient understands and wishes to proceed with the surgery.

## 2015-08-28 ENCOUNTER — Telehealth: Payer: Self-pay | Admitting: General Surgery

## 2015-08-28 ENCOUNTER — Ambulatory Visit
Admission: RE | Admit: 2015-08-28 | Discharge: 2015-08-28 | Disposition: A | Discharge status: Home / Self Care | Payer: PRIVATE HEALTH INSURANCE | Source: Ambulatory Visit | Attending: General Surgery | Admitting: General Surgery

## 2015-08-28 DIAGNOSIS — C187 Malignant neoplasm of sigmoid colon: Secondary | ICD-10-CM

## 2015-08-28 MED ORDER — IOPAMIDOL (ISOVUE-300) INJECTION 61%
75.0000 mL | Freq: Once | INTRAVENOUS | Status: AC | PRN
  Administered 2015-08-28: 75 mL via INTRAVENOUS

## 2015-08-28 NOTE — Telephone Encounter (Signed)
CT findings concerning for possible metastatic disease.His was discussed with the patient.  Will discuss further with oncology tomorrow.

## 2015-08-29 ENCOUNTER — Encounter: Payer: Self-pay | Admitting: *Deleted

## 2015-08-29 ENCOUNTER — Telehealth: Payer: Self-pay | Admitting: *Deleted

## 2015-08-29 DIAGNOSIS — C187 Malignant neoplasm of sigmoid colon: Secondary | ICD-10-CM

## 2015-08-29 NOTE — Telephone Encounter (Signed)
Oncology Nurse Navigator Documentation  Oncology Nurse Navigator Flowsheets 08/29/2015  Navigator Location [facility]  Navigator Encounter Type Introductory phone call  Abnormal Finding Date 07/05/2015  Confirmed Diagnosis Date 08/07/2015  Barriers/Navigation Needs Coordination of Care  Interventions Coordination of Care-order placed for PET scan per Dr. Benay Spice after GI Conference discussion today  Coordination of Care Radiology  Time Spent with Patient 15  Spoke with patient and provided new patient appointment for 08/31/15 at 0815/0830 in GI [place] with Dr. Benay Spice. Informed of location of [place], valet service, and registration process. Reminded to bring insurance cards and a current medication list, including supplements. Patient verbalizes understanding.Notified him that we will be ordering a PET scan for him for the near future to complete his staging workup. Notified managed care to start prior authorization on PET and notified HIM to add appointment to [facility]

## 2015-08-31 ENCOUNTER — Encounter: Payer: Self-pay | Admitting: Physical Therapy

## 2015-08-31 ENCOUNTER — Telehealth: Payer: Self-pay | Admitting: Oncology

## 2015-08-31 ENCOUNTER — Ambulatory Visit: Payer: PRIVATE HEALTH INSURANCE | Attending: Oncology | Admitting: Physical Therapy

## 2015-08-31 ENCOUNTER — Encounter: Payer: Self-pay | Admitting: Oncology

## 2015-08-31 ENCOUNTER — Other Ambulatory Visit: Payer: Self-pay | Admitting: General Surgery

## 2015-08-31 ENCOUNTER — Ambulatory Visit: Payer: PRIVATE HEALTH INSURANCE | Admitting: Nutrition

## 2015-08-31 ENCOUNTER — Ambulatory Visit (HOSPITAL_BASED_OUTPATIENT_CLINIC_OR_DEPARTMENT_OTHER): Payer: PRIVATE HEALTH INSURANCE | Admitting: Oncology

## 2015-08-31 ENCOUNTER — Encounter: Payer: Self-pay | Admitting: *Deleted

## 2015-08-31 ENCOUNTER — Ambulatory Visit (HOSPITAL_BASED_OUTPATIENT_CLINIC_OR_DEPARTMENT_OTHER): Payer: PRIVATE HEALTH INSURANCE

## 2015-08-31 VITALS — BP 119/82 | HR 78 | Temp 98.4°F | Resp 18 | Ht 70.0 in | Wt 238.8 lb

## 2015-08-31 DIAGNOSIS — M62838 Other muscle spasm: Secondary | ICD-10-CM | POA: Diagnosis present

## 2015-08-31 DIAGNOSIS — G893 Neoplasm related pain (acute) (chronic): Secondary | ICD-10-CM

## 2015-08-31 DIAGNOSIS — C187 Malignant neoplasm of sigmoid colon: Secondary | ICD-10-CM

## 2015-08-31 NOTE — Patient Instructions (Signed)
  Care Plan Summary- 08/31/2015 Name:  Marc King     DOB: 06/08/1975 Your Medical Team:  Medical Oncologist:  Dr. Ma Rings Radiation Oncologist:   Surgeon:   Dr. Leighton Ruff Type of Cancer: Colon Cancer-adenocarcinoma  Stage/Grade: Undetermined *Exact staging of your cancer is based on size of the tumor, depth of invasion, involvement of lymph nodes or not, and whether or not the cancer has spread beyond the primary site.   Recommendations: Based on information available as of today's consult. Recommendations may change depending on the results of further tests or exams. 1) Surgery to remove primary tumor, then adjuvant chemotherapy based on stage 2) PET scan and labs to complete staging 3) Genetics counselor referral in July or August 4) Sent tumor tissue for molecular testing  ______________________________________________________________________________ Next Steps: 1) Labs today and PET scan on 09/05/15 at 0700 at [facility] 2) Follow up with [place] regarding surgery date 3) See Dr. Benay Spice approximately 10 days after surgery for planning  Questions? Merceda Elks, RN, BSN at [phone]. [name] is your Oncology Nurse Navigator and is available to assist you while you're receiving your medical care at [facility].

## 2015-08-31 NOTE — Therapy (Signed)
[facility] [address] [place], [place], [address] Phone: [phone]   Fax:  [phone]  Physical Therapy Evaluation  Patient Details  Name: Marc King MRN: [id number] Date of Birth: 1975-07-07 Referring Provider: Dr. Benay Spice  Encounter Date: 08/31/2015      PT End of Session - 08/31/15 1015    Visit Number 1   PT Start Time 1000   PT Stop Time 1015   PT Time Calculation (min) 15 min   Activity Tolerance Patient tolerated treatment well   Behavior During Therapy [facility] for tasks assessed/performed      Past Medical History  Diagnosis Date  . Scarlet fever   . Cancer of sigmoid colon ([place]) 08/07/2015    History reviewed. No pertinent past surgical history.  There were no vitals filed for this visit.       Subjective Assessment - 08/31/15 1015    Subjective Patient is attending GI clinic with his wife due to having sigmoid colon cancer.    Patient is accompained by: Family member  wife   Patient Stated Goals education    Currently in Pain? Yes   Pain Score 5    Pain Location Abdomen   Pain Orientation Mid;Lower   Pain Descriptors / Indicators Cramping   Pain Type Acute pain   Pain Onset 1 to 4 weeks ago   Pain Frequency Constant   Aggravating Factors  long drives   Effect of Pain on Daily Activities motrin and tylenol   Multiple Pain Sites No            [facility] PT Assessment - 08/31/15 0001    Assessment   Medical Diagnosis colon cancer   Referring Provider Dr. Benay Spice   Onset Date/Surgical Date 08/02/08   Prior Therapy None   Precautions   Precautions Other (comment)   Precaution Comments cancer precautions   Restrictions   Weight Bearing Restrictions No   Balance Screen   Has the patient fallen in the past 6 months No   Has the patient had a decrease in activity level because of a fear of falling?  No   Is the patient reluctant to leave their home because of a fear of falling?   No   Home Environment   Living Environment Private residence   Available Help at Discharge Family   Prior Function   Level of Independence Independent   Vocation Full time employment   Vocation Requirements sitting, driving, walking   Leisure fishing   Cognition   Overall Cognitive Status Within Functional Limits for tasks assessed   Observation/Other Assessments   Focus on Therapeutic Outcomes (FOTO)  therapist discretion is limited by 15% due to increased pain with sitting for long drives and pain is constant nowinstead of intemittent   ROM / Strength   AROM / PROM / Strength AROM;Strength   AROM   Overall AROM Comments lumbar ROM is full   Strength   Overall Strength Comments LE strength is 5/5   Ambulation/Gait   Ambulation/Gait No   Balance   Balance Assessed --  Single leg stance without difficulty                           PT Education - 08/31/15 1022    Education provided Yes   Education Details toileting technique, tips to conserve energy, scar massage, walking program   Person(s) Educated Patient;Spouse   Methods Explanation;Demonstration;Handout   Comprehension  Returned demonstration;Verbalized understanding             PT Long Term Goals - 08/31/15 1028    PT LONG TERM GOAL #1   Title independent with walking program and how it helps with recovery   Time 1   Period Days   Status Achieved   PT LONG TERM GOAL #2   Title understand tips to conserve energy so he can rest when needed during recovery   Time 1   Period Days   Status Achieved   PT LONG TERM GOAL #3   Title understand scar massage starting 6 weeks after surgery to keep it pliable   Time 1   Period Days   Status Achieved   PT LONG TERM GOAL #4   Title understand toileting techniques to make bowel movement easier and not have to strain   Time 1   Period Days   Status Achieved               Plan - 08/31/15 1022    Clinical Impression Statement Patient is a 40  year old male with diagnosis of colon cancer 08/07/2015.  Patient is attending GI clinc.  Patient will be having surgery in 1 week followed by chemotherapy.  Patient  reports abdominal pain at level 5/10 when he drives for long periods but a constant pain at level 2/10.  Patient is an active individual  who fishes. Patient has full limbar ROM and bilateral lower extremity strength is 5/5.  Patient was educated on walking program to help with endurance and recovery, toileting technique, tips to conserve energy and scar massage. Patient benefited from physical therapy to  be educated on care during treatment.    Rehab Potential Excellent   Clinical Impairments Affecting Rehab Potential None   PT Frequency 1x / week   PT Duration --  1 session in GI clinic   PT Treatment/Interventions Patient/family education   PT Next Visit Plan Discharge to HEP   PT Home Exercise Plan current HEP   Recommended Other Services None   Consulted and Agree with Plan of Care Patient;Family member/caregiver   Family Member Consulted wife      Patient will benefit from skilled therapeutic intervention in order to improve the following deficits and impairments:  Other (comment) (Decreased knowledge about recovery from sigmoid cancer treatment)  Visit Diagnosis: Other muscle spasm - Plan: PT plan of care cert/re-cert     Problem List Patient Active Problem List   Diagnosis Date Noted  . Cancer of sigmoid colon [facility]) 08/07/2015    Earlie Counts, PT 08/31/2015 10:35 AM   [facility] [address] [place], [place], [address] Phone: [phone]   Fax:  [phone]  Name: Marc King MRN: [id number] Date of Birth: 1975/07/20

## 2015-08-31 NOTE — Telephone Encounter (Signed)
per po fto sch pt appt-gave pt copy of avs-sent back to lab °

## 2015-08-31 NOTE — Telephone Encounter (Signed)
per pof to sch pt appt-gave pt copy of avs °

## 2015-08-31 NOTE — Progress Notes (Signed)
Marc King   Referring MD: Leighton Ruff, Md [id number] [place], [place] [address]   Marc King 40 y.o.  20-Jan-1976    Reason for Referral: Colon cancer   HPI: Marc King developed right lower quadrant pain in September 2016. He was seen in the emergency room. There was an associated fever. A CT revealed a normal appendix. No adenopathy. Focal thickening was noted in the wall of the sigmoid colon above the bladder. The findings were suspicious for focal colitis or diverticulitis. Lymph nodes were noted in the adjacent mesentery. He was diagnosed with diverticulitis and completed a course of antibiotics. He reports the pain resolved.  He developed recurrent pain last month and was again seen in the emergency room. A CT of the abdomen and pelvis 07/05/2015 revealed thickening of the distal sigmoid colon with masslike stranding in the sigmoid mesentery with prominent local lymph nodes. A few prominent nodes were also seen at the periaortic and porta hepatis areas. The liver appeared normal.  He was referred to Dr. Lorenza Chick comes taken to a colonoscopy on 08/07/2015. A fungating mass was found in the sigmoid colon from 20 04/01/2025 centimeters from the anal verge. Biopsies were taken. Area was tattooed. An 8 mm polyp was found in the cecum. The pathology revealed a hyperplastic polyp at the cecum. The sigmoid colon biopsy was positive for adenocarcinoma.  Marc King was referred to Dr. Marcello Moores. A CT of the chest 08/28/2015 revealed small, not enlarged, lymph nodes in the mediastinum and hilar regions. Multiple bilateral pulmonary nodules including an 8 mm nodule in the left infrahilar region, a 6 mm left lower lobe nodule, and an 11 mm irregular nodule in the right lung. Borderline lymphadenopathy at the hepatoduodenal ligament and portacaval areas.  He reports having a low-grade fever this week. He continues to have right abdominal pain.  Past  Medical History  Diagnosis Date  . Scarlet fever   . Cancer of sigmoid colon ([place]) 08/07/2015    .  Diabetes-currently not taking medication  Past surgical history: Marland Kitchen Vasectomy  Medications: Reviewed  Allergies: No Known Allergies  Family history: His father had melanoma. His mother has "leukemia" and has not required treatment. He has 2 sisters. His maternal great-grandmother had colon cancer.  Social History:   He lives with his wife in [place]. He works in Press [employer]. He quit smoking cigarettes and drinking alcohol 10 years ago. No transfusion history. No risk factor for HIV or hepatitis.     ROS:   Positives include: 16 pound weight loss, rectal bleeding prior to the colonoscopy, low-grade fever  A complete ROS was otherwise negative.  Physical Exam:  Blood pressure 119/82, pulse 78, temperature 98.4 F (36.9 C), temperature source Oral, resp. rate 18, height 5\' 10"  (1.778 m), weight 238 lb 12.8 oz (108.319 kg), SpO2 100 %.  HEENT: Oropharynx without distal mass, neck without mass Lungs: Clear bilaterally Cardiac: Regular rate and rhythm Abdomen: No hepatosplenomegaly, no mass, nontender, no apparent ascites GU: Testes without mass, uncircumcised male  Vascular: No leg edema Lymph nodes: No cervical, supra-clavicular, axillary, or inguinal nodes Neurologic: Alert and oriented, the motor exam appears intact in the upper and lower extremities Skin: No rash Musculoskeletal: No spine tenderness   LAB:  CBC  Lab Results  Component Value Date   WBC 11.0* 07/05/2015   HGB 13.5 07/05/2015   HCT 40.4 07/05/2015   MCV 84.7 07/05/2015   PLT 297 07/05/2015  NEUTROABS 9.6* 12/16/2014     CMP      Component Value Date/Time   NA 138 07/05/2015 1559   K 3.9 07/05/2015 1559   CL 100* 07/05/2015 1559   CO2 28 07/05/2015 1559   GLUCOSE 172* 07/05/2015 1559   BUN 8 07/05/2015 1559   CREATININE 0.92 07/05/2015 1559   CALCIUM 9.3 07/05/2015 1559   PROT 7.2  07/05/2015 1559   ALBUMIN 3.4* 07/05/2015 1559   AST 18 07/05/2015 1559   ALT 16* 07/05/2015 1559   ALKPHOS 42 07/05/2015 1559   BILITOT 0.9 07/05/2015 1559   GFRNONAA >60 07/05/2015 1559   GFRAA >60 07/05/2015 1559     Imaging:  CT chest 08/28/2015 and CT abdomen/pelvis 07/05/2015-images reviewed with Marc King and his wife   Assessment/Plan:   1. Adenocarcinoma the sigmoid colon, status post a colonoscopic biopsy 08/07/2015  CT abdomen/pelvis 07/05/2015 with masslike thickening of the sigmoid colon, adjacent lymphadenopathy, and prominent para-aortic and porta hepatis nodes  CT chest 08/28/2015 with bilateral pulmonary nodules suspicious for metastases  2.    Right lower abdominal pain secondary to #1   Disposition:   Marc King has been diagnosed with colon cancer. His case was presented at the GI tumor conference on 08/29/2015. Dr. Marcello Moores is concerned of the proximity of the tumor to the ureter. She plans to proceed with primary resection.  The lung nodules and abdominal/retroperitoneal lymph nodes may represent metastatic disease. Marc King understands surgery will not be curative if he is confirmed to have metastatic disease. He is scheduled for a staging PET scan on 09/04/2015.  We checked a CEA today. We will follow-up on the PET scan next week. I will see him after surgery to recommend systemic treatment options.  He understands his family members are at increased risk of developing colon cancer and should receive appropriate screening. The primary tumor will be screened for hereditary non-polyposis colon cancer syndrome.  Approximately 50 minutes were spent with the patient today. The majority of the time was used for counseling and coordination of care.  Betsy Coder, MD  08/31/2015, 5:22 PM

## 2015-08-31 NOTE — Patient Instructions (Signed)
Tips for Energy Conservation for Activities of Daily Living . Plan ahead to avoid rushing. . Sit down to bathe and dry off. Wear a terry robe instead of drying off. . Use a shower/bath organizer to decrease leaning and reaching. . Use extension handles on sponges and brushes. Susa Simmonds grab rails in the bathroom or use an elevated toilet seat. Hoyle Barr out clothes and toiletries before dressing. . Minimize leaning over to put on clothes and shoes. Bring your foot to your knee to apply socks and shoes. . Wear comfortable shoes and low-heeled, slip on shoes. Wear button front shirts rather than pullovers. Housekeeping . Schedule household tasks throughout the week. . Do housework sitting down when possible. . Delegate heavy housework, shopping, laundry and child care when possible. . Drag or slide objects rather than lifting. . Sit when ironing and take rest periods. . Stop working before becoming overly tired. Shopping . Organize list by aisle. . Use a grocery cart for support. Marland Kitchen Shop at less busy times. . Ask for help with getting to the car. Meal Preparation . Use convenience and easy-to-prepare foods. . Use small appliances that take less effort to use. Marland Kitchen Prepare meals sitting down. . Soak dishes instead of scrubbing and let dishes air dry. . Prepare double portions and freeze half. Child Care . Plan activities that can be done sitting down, such as drawing pictures, playing games, reading, and computer games. . Encourage children to climb up onto your lap or into the highchair instead of being lifted. . Make a game of the household chores so that children will want to help. . Delegate child care when possible.  Earlie Counts, PT, [facility]; [place], [place], [place] [address] Toileting Techniques for Bowel Movements (Defecation) Using your belly (abdomen) and pelvic floor muscles to have a bowel movement is usually instinctive.   Sometimes people can have problems with these muscles and have to relearn proper defecation (emptying) techniques.  If you have weakness in your muscles, organs that are falling out, decreased sensation in your pelvis, or ignore your urge to go, you may find yourself straining to have a bowel movement.  You are straining if you are: . holding your breath or taking in a huge gulp of air and holding it  . keeping your lips and jaw tensed and closed tightly . turning red in the face because of excessive pushing or forcing . developing or worsening your  hemorrhoids . getting faint while pushing . not emptying completely and have to defecate many times a day  If you are straining, you are actually making it harder for yourself to have a bowel movement.  Many people find they are pulling up with the pelvic floor muscles and closing off instead of opening the anus. Due to lack pelvic floor relaxation and coordination the abdominal muscles, one has to work harder to push the feces out.  Many people have never been taught how to defecate efficiently and effectively.  Notice what happens to your body when you are having a bowel movement.  While you are sitting on the toilet pay attention to the following areas: . Jaw and mouth position . Angle of your hips   . Whether your feet touch the ground or not . Arm placement  . Spine position . Waist . Belly tension . Anus (opening of the anal canal)  An Evacuation/Defecation Plan   Here are the 4 basic points:  1. Lean forward enough for your elbows to rest on your knees 2. Support your feet on the floor or use a low stool if your feet don't touch the floor  3. Push out your belly as if you have swallowed a beach ball-you should feel a widening of your waist 4. Open and relax your pelvic floor muscles, rather than tightening around the anus      The following conditions my require modifications to your toileting posture:  . If you have had surgery  in the past that limits your back, hip, pelvic, knee or ankle flexibility . Constipation   Your healthcare practitioner may make the following additional suggestions and adjustments:  1) Sit on the toilet  a) Make sure your feet are supported. b) Notice your hip angle and spine position-most people find it effective to lean forward or raise their knees, which can help the muscles around the anus to relax  c) When you lean forward, place your forearms on your thighs for support  2) Relax suggestions a) Breath deeply in through your nose and out slowly through your mouth as if you are smelling the flowers and blowing out the candles. b) To become aware of how to relax your muscles, contracting and releasing muscles can be helpful.  Pull your pelvic floor muscles in tightly by using the image of holding back gas, or closing around the anus (visualize making a circle smaller) and lifting the anus up and in.  Then release the muscles and your anus should drop down and feel open. Repeat 5 times ending with the feeling of relaxation. c) Keep your pelvic floor muscles relaxed; let your belly bulge out. d) The digestive tract starts at the mouth and ends at the anal opening, so be sure to relax both ends of the tube.  Place your tongue on the roof of your mouth with your teeth separated.  This helps relax your mouth and will help to relax the anus at the same time.  3) Empty (defecation) a) Keep your pelvic floor and sphincter relaxed, then bulge your anal muscles.  Make the anal opening wide.  b) Stick your belly out as if you have swallowed a beach ball. c) Make your belly wall hard using your belly muscles while continuing to breathe. Doing this makes it easier to open your anus. d) Breath out and give a grunt (or try using other sounds such as ahhhh, shhhhh, ohhhh or grrrrrrr).  4) Finish a) As you finish your bowel movement, pull the pelvic floor muscles up and in.  This will leave your anus in  the proper place rather than remaining pushed out and down. If you leave your anus pushed out and down, it will start to feel as though that is normal and give you incorrect signals about needing to have a bowel movement.    Earlie Counts, PT [facility] [address] [place], [place] [address]  Ways to get started on an exercise program 1.  Start for 10 minutes per day with a walking program. 2. Work towards 30 minutes of exercise per day 3. When you do an aerobic exercise program start on a low level 4. Water aerobics is a good place due to decreased strain on your joints 5. Begin your exercise program gradually and progress slowly over time 6. When exercising use correct form. a. Keep neutral spine b. Engage abdominals c. Keep chest up  d. Chin down e. Do not lock  your knees  Earlie Counts, PT [facility], [place], [place] [address] [phone]  Scar Massage  Scar massage is done to improve the mobility of scar, decrease scar tissue from building up, reduce adhesions, and prevent Keloids from forming. Start scar massage after scabs have fallen off by themselves and no open areas. The first few weeks after surgery, it is normal for a scar to appear pink or red and slightly raised. Scars can itch or have areas of numbness. Some scars may be sensitive.   Direct Scar massage: after scar is healed, no opening, no scab 1.  Place pads of two fingers together directly on the scar starting at one end of the scar. Move the fingers up and down across the scar holding 5 seconds one direction.  Then go opposite direction hold 5 seconds.  2. Move over to the next section of the scar and repeat.  Work your way along the entire length of the scar.   3. Next make diagonal movements along the scar holding 5 seconds at one direction. 4. Next movement is side to side. 5. Do not rub fingers over the scar.  Instead keep  firm pressure and move scar over the tissue it is on top   Scar Lift and Roll 12 weeks after surgery. 1. Pinch a small amount of the scar between your first two fingers and thumb.  2. Roll the scar between your fingers for 5 to 15 seconds. 3. Move along the scar and repeat until you have massaged the entire length of scar.   Stop the massage and call your doctor if you notice: 1. Increased redness 2. Bleeding from scar 3. Seepage coming from the scar 4. Scar is warmer and has increased pain

## 2015-08-31 NOTE — Progress Notes (Signed)
Patient was seen in [facility].  40 year old male diagnosed with colon cancer with plans for surgery and possibly chemotherapy.  Past medical history includes scarlet fever.  Medications were reviewed.  Labs include glucose 123, and albumin 3.4.  Height: 5 feet 10 inches. Weight: 238.8 pounds. Usual body weight 250 pounds. BMI: 34.26.  Patient reports he has eliminated some high fiber foods because he thought he had diverticulitis. Patient is interested in increasing healthy foods in current diet. Patient does not currently have nutrition impact symptoms.  Nutrition diagnosis: Food and nutrition related knowledge deficit related to new diagnosis of colon cancer as evidenced by no prior need for nutrition related information.  Intervention:  Patient was educated on healthy plant-based diet.   Educated patient on eliminating processed meats and reducing red meats. Encouraged increased vegetables, whole fruits and whole gr. Educated patient on the importance of high protein foods before and after surgery. Provided a fact sheet. Questions were answered.  Teach back method used.  Contact information was given.  Monitoring, evaluation, goals: Patient will tolerate adequate calories and protein to promote healing after surgery.  Patient will contact me if he develops questions or concerns.  No follow-up has been scheduled.  **Disclaimer: This note was dictated with voice recognition software. Similar sounding words can inadvertently be transcribed and this note may contain transcription errors which may not have been corrected upon publication of note.**

## 2015-08-31 NOTE — Progress Notes (Signed)
Oncology Nurse Navigator Documentation  Oncology Nurse Navigator Flowsheets 08/31/2015  Navigator Location [facility]-Med Onc  Navigator Encounter Type Initial MedOnc  Abnormal Finding Date -  Confirmed Diagnosis Date -  Patient Visit Type MedOnc;Initial---GI MDC  Treatment Phase Pre-Tx/Tx Discussion  Barriers/Navigation Needs Education  Education Actor Options;Newly Diagnosed Cancer Education;Preparing for Upcoming Surgery/ Treatment  Interventions Education Method  Coordination of Care -  Education Method Verbal;Written;Teach-back  Support Groups/Services GI Support Group;American [employer] for [place]  Acuity Level 2  Time Spent with Patient 10  Met with patient and wife, April during new patient visit. Explained the role of the GI Nurse Navigator and provided New Patient Packet with information on: 1. Colon cancer-info and verbal review of CEA test, PET scan and PAC 2. Support groups 3. Advanced Directives 4. Fall Safety Plan Answered questions, reviewed current treatment plan using TEACH back and provided emotional support. Provided copy of current treatment plan. Seen in clinic today by CSW, dietician and physical therapy. Patient has good understanding of his situation and plan of care. He plans to call [facility] today to inquire about surgery date and inform of PET scan on 6/7. Dr. Benay Spice spoke with Dr. Marcello Moores about placement of [facility] at time of surgery as indicated per PET result. Escorted patient to scheduler and to lab after appointment. He will be called on 6/8 with his PET and CEA results. Understands need for genetics counselor visit and this will be scheduled after surgery for July or August. He is aware that his children are at higher risk for colon cancer and need 1st colonoscopy at age 79.  Merceda Elks, RN, BSN GI Oncology [place]

## 2015-08-31 NOTE — Progress Notes (Signed)
[facility] Psychosocial Distress Screening Clinical Social Work  Clinical Social Work met with pt and his wife at [facility] to introduce self, review Support Programs, role of CSW and review the distress screening protocol.  The patient scored a 1 on the Psychosocial Distress Thermometer which indicates no distress. Pt expressed some anxiety surrounding his diagnosis and waiting for a plan. He reports his pain was addressed by the medical team today. Pt reports he has great support from family, his church and friends. He reports to cope well through being outside, fishing and working in his yard. He expressed some concern about finances related to his treatment and CSW reviewed resources to assist here at [facility]. Pt provided with hand outs and Support Program/Team information. Pt and wife encouraged to reach out as needs arise.   ONCBCN DISTRESS SCREENING 08/31/2015  Screening Type Initial Screening  Distress experienced in past week (1-10) 1  Information Concerns Type Lack of info about treatment  Physical Problem type Pain  Physician notified of physical symptoms Yes  Referral to clinical social work Yes  Referral to support programs Yes    Clinical Social Worker follow up needed: No.  If yes, follow up plan:  Loren Racer, [place]  [facility] Phone: [phone] Fax: [phone]

## 2015-09-01 LAB — CEA: CEA1: 6.6 ng/mL — AB (ref 0.0–4.7)

## 2015-09-03 ENCOUNTER — Telehealth: Payer: Self-pay

## 2015-09-03 NOTE — Telephone Encounter (Signed)
Pt and wife are asking for some direction about his surgery. He has not heard from the surgeon and it has been 2 weeks tomorrow. She is requesting to talk with Merceda Elks. S/w [name] and she will call pt back.

## 2015-09-03 NOTE — Telephone Encounter (Signed)
Called [facility] and left VM for surgery scheduler that patient requesting status report on his surgery scheduling. Also sent message to Dr. Manon Hilding nurse to make her aware of patient's concern. Spoke with his wife and made her aware of my two communication requests to [facility]. Requested they give them a day to get back to me or to them.

## 2015-09-04 ENCOUNTER — Other Ambulatory Visit: Payer: Self-pay | Admitting: Urology

## 2015-09-04 NOTE — Patient Instructions (Signed)
KHINGSTON OETKEN  09/04/2015   Your procedure is scheduled on: 09/13/2015    Report to [facility] Main  Entrance take [place]  elevators to 3rd floor to  [place] at    0730 AM.  Call this number if you have problems the morning of surgery [phone]   Remember: ONLY 1 PERSON MAY GO WITH YOU TO SHORT STAY TO GET  READY MORNING OF [place].  Do not eat food or drink liquids :After Midnight.  Follow bowel Prep Instructions per MD    Take these medicines the morning of surgery with A SIP OF WATER: none                                 You may not have any metal on your body including hair pins and              piercings  Do not wear jewelry, , lotions, powders or perfumes, deodorant                          Men may shave face and neck.   Do not bring valuables to the hospital. [place].  Contacts, dentures or bridgework may not be worn into surgery.  Leave suitcase in the car. After surgery it may be brought to your room.         Special Instructions: coughing and deep breathing exercises, leg exercises               Please read over the following fact sheets you were given: _____________________________________________________________________             [facility] - Preparing for Surgery Before surgery, you can play an important role.  Because skin is not sterile, your skin needs to be as free of germs as possible.  You can reduce the number of germs on your skin by washing with CHG (chlorahexidine gluconate) soap before surgery.  CHG is an antiseptic cleaner which kills germs and bonds with the skin to continue killing germs even after washing. Please DO NOT use if you have an allergy to CHG or antibacterial soaps.  If your skin becomes reddened/irritated stop using the CHG and inform your nurse when you arrive at Short Stay. Do not shave (including legs and underarms) for at least 48 hours prior  to the first CHG shower.  You may shave your face/neck. Please follow these instructions carefully:  1.  Shower with CHG Soap the night before surgery and the  morning of Surgery.  2.  If you choose to wash your hair, wash your hair first as usual with your  normal  shampoo.  3.  After you shampoo, rinse your hair and body thoroughly to remove the  shampoo.                           4.  Use CHG as you would any other liquid soap.  You can apply chg directly  to the skin and wash                       Gently with a  scrungie or clean washcloth.  5.  Apply the CHG Soap to your body ONLY FROM THE NECK DOWN.   Do not use on face/ open                           Wound or open sores. Avoid contact with eyes, ears mouth and genitals (private parts).                       Wash face,  Genitals (private parts) with your normal soap.             6.  Wash thoroughly, paying special attention to the area where your surgery  will be performed.  7.  Thoroughly rinse your body with warm water from the neck down.  8.  DO NOT shower/wash with your normal soap after using and rinsing off  the CHG Soap.                9.  Pat yourself dry with a clean towel.            10.  Wear clean pajamas.            11.  Place clean sheets on your bed the night of your first shower and do not  sleep with pets. Day of Surgery : Do not apply any lotions/deodorants the morning of surgery.  Please wear clean clothes to the hospital/surgery center.  FAILURE TO FOLLOW THESE INSTRUCTIONS MAY RESULT IN THE CANCELLATION OF YOUR SURGERY PATIENT SIGNATURE_________________________________  NURSE SIGNATURE__________________________________  ________________________________________________________________________    CLEAR LIQUID DIET   Foods Allowed                                                                     Foods Excluded  Coffee and tea, regular and decaf                             liquids that you cannot  Plain Jell-O  in any flavor                                             see through such as: Fruit ices (not with fruit pulp)                                     milk, soups, orange juice  Iced Popsicles                                    All solid food Carbonated beverages, regular and diet                                    Cranberry, grape and apple juices Sports drinks like Gatorade Lightly seasoned clear broth or consume(fat free) Sugar, honey syrup  Sample Menu Breakfast  Lunch                                     Supper Cranberry juice                    Beef broth                            Chicken broth Jell-O                                     Grape juice                           Apple juice Coffee or tea                        Jell-O                                      Popsicle                                                Coffee or tea                        Coffee or tea  _____________________________________________________________________   WHAT IS A BLOOD TRANSFUSION? Blood Transfusion Information  A transfusion is the replacement of blood or some of its parts. Blood is made up of multiple cells which provide different functions.  Red blood cells carry oxygen and are used for blood loss replacement.  White blood cells fight against infection.  Platelets control bleeding.  Plasma helps clot blood.  Other blood products are available for specialized needs, such as hemophilia or other clotting disorders. BEFORE THE TRANSFUSION  Who gives blood for transfusions?   Healthy volunteers who are fully evaluated to make sure their blood is safe. This is blood bank blood. Transfusion therapy is the safest it has ever been in the practice of medicine. Before blood is taken from a donor, a complete history is taken to make sure that person has no history of diseases nor engages in risky social behavior (examples are intravenous drug use or sexual activity  with multiple partners). The donor's travel history is screened to minimize risk of transmitting infections, such as malaria. The donated blood is tested for signs of infectious diseases, such as HIV and hepatitis. The blood is then tested to be sure it is compatible with you in order to minimize the chance of a transfusion reaction. If you or a relative donates blood, this is often done in anticipation of surgery and is not appropriate for emergency situations. It takes many days to process the donated blood. RISKS AND COMPLICATIONS Although transfusion therapy is very safe and saves many lives, the main dangers of transfusion include:   Getting an infectious disease.  Developing a transfusion reaction. This is an allergic reaction to something in the blood you were given. Every precaution is taken to prevent this. The decision to have  a blood transfusion has been considered carefully by your caregiver before blood is given. Blood is not given unless the benefits outweigh the risks. AFTER THE TRANSFUSION  Right after receiving a blood transfusion, you will usually feel much better and more energetic. This is especially true if your red blood cells have gotten low (anemic). The transfusion raises the level of the red blood cells which carry oxygen, and this usually causes an energy increase.  The nurse administering the transfusion will monitor you carefully for complications. HOME CARE INSTRUCTIONS  No special instructions are needed after a transfusion. You may find your energy is better. Speak with your caregiver about any limitations on activity for underlying diseases you may have. SEEK MEDICAL CARE IF:   Your condition is not improving after your transfusion.  You develop redness or irritation at the intravenous (IV) site. SEEK IMMEDIATE MEDICAL CARE IF:  Any of the following symptoms occur over the next 12 hours:  Shaking chills.  You have a temperature by mouth above 102 F (38.9  C), not controlled by medicine.  Chest, back, or muscle pain.  People around you feel you are not acting correctly or are confused.  Shortness of breath or difficulty breathing.  Dizziness and fainting.  You get a rash or develop hives.  You have a decrease in urine output.  Your urine turns a dark color or changes to pink, red, or brown. Any of the following symptoms occur over the next 10 days:  You have a temperature by mouth above 102 F (38.9 C), not controlled by medicine.  Shortness of breath.  Weakness after normal activity.  The white part of the eye turns yellow (jaundice).  You have a decrease in the amount of urine or are urinating less often.  Your urine turns a dark color or changes to pink, red, or brown. Document Released: 03/14/2000 Document Revised: 06/09/2011 Document Reviewed: 11/01/2007 [facility] Patient Information 2014 [place], [place].  _______________________________________________________________________

## 2015-09-05 ENCOUNTER — Ambulatory Visit (HOSPITAL_COMMUNITY)
Admission: RE | Admit: 2015-09-05 | Discharge: 2015-09-05 | Disposition: A | Discharge status: Home / Self Care | Payer: PRIVATE HEALTH INSURANCE | Source: Ambulatory Visit | Attending: Oncology | Admitting: Oncology

## 2015-09-05 ENCOUNTER — Telehealth: Payer: Self-pay | Admitting: *Deleted

## 2015-09-05 DIAGNOSIS — R590 Localized enlarged lymph nodes: Secondary | ICD-10-CM | POA: Insufficient documentation

## 2015-09-05 DIAGNOSIS — C187 Malignant neoplasm of sigmoid colon: Secondary | ICD-10-CM | POA: Insufficient documentation

## 2015-09-05 DIAGNOSIS — R918 Other nonspecific abnormal finding of lung field: Secondary | ICD-10-CM | POA: Insufficient documentation

## 2015-09-05 DIAGNOSIS — N200 Calculus of kidney: Secondary | ICD-10-CM | POA: Diagnosis not present

## 2015-09-05 LAB — GLUCOSE, CAPILLARY: Glucose-Capillary: 130 mg/dL — ABNORMAL HIGH (ref 65–99)

## 2015-09-05 MED ORDER — FLUDEOXYGLUCOSE F - 18 (FDG) INJECTION
11.6700 | Freq: Once | INTRAVENOUS | Status: AC | PRN
  Administered 2015-09-05: 11.67 via INTRAVENOUS

## 2015-09-05 NOTE — Telephone Encounter (Signed)
Oncology Nurse Navigator Documentation  Oncology Nurse Navigator Flowsheets 09/05/2015  Navigator Location [facility]-Med Onc  Navigator Encounter Type Telephone  Telephone Diagnostic Results  Abnormal Finding Date -  Confirmed Diagnosis Date -  Patient Visit Type -  Treatment Phase -  Barriers/Navigation Needs -  Education -Notified of CEA result and PET result--cancer in colon; small nonspecific nodules in lungs were not hypermetabolic (could be scarring or from past smoking) and some enlarged lymph nodes possibly in back of abdominal cavity.  Interventions -  Coordination of Care -  Education Method -  Support Groups/Services -  Acuity -  Time Spent with Patient 15

## 2015-09-06 ENCOUNTER — Encounter (HOSPITAL_COMMUNITY)
Admission: RE | Admit: 2015-09-06 | Discharge: 2015-09-06 | Disposition: A | Discharge status: Home / Self Care | Payer: PRIVATE HEALTH INSURANCE | Source: Ambulatory Visit | Attending: General Surgery | Admitting: General Surgery

## 2015-09-06 ENCOUNTER — Encounter (HOSPITAL_COMMUNITY): Payer: Self-pay

## 2015-09-06 DIAGNOSIS — C187 Malignant neoplasm of sigmoid colon: Secondary | ICD-10-CM | POA: Diagnosis not present

## 2015-09-06 LAB — BASIC METABOLIC PANEL
Anion gap: 7 (ref 5–15)
BUN: 10 mg/dL (ref 6–20)
CALCIUM: 8.9 mg/dL (ref 8.9–10.3)
CO2: 23 mmol/L (ref 22–32)
CREATININE: 0.73 mg/dL (ref 0.61–1.24)
Chloride: 106 mmol/L (ref 101–111)
GFR calc Af Amer: >60 mL/min (ref >60)
GFR calc non Af Amer: >60 mL/min (ref >60)
GLUCOSE: 148 mg/dL — AB (ref 65–99)
Potassium: 4.1 mmol/L (ref 3.5–5.1)
Sodium: 136 mmol/L (ref 135–145)

## 2015-09-06 LAB — CBC
HCT: 38 % — ABNORMAL LOW (ref 39.0–52.0)
Hemoglobin: 12.9 g/dL — ABNORMAL LOW (ref 13.0–17.0)
MCH: 27.7 pg (ref 26.0–34.0)
MCHC: 33.9 g/dL (ref 30.0–36.0)
MCV: 81.5 fL (ref 78.0–100.0)
PLATELETS: 365 10*3/uL (ref 150–400)
RBC: 4.66 MIL/uL (ref 4.22–5.81)
RDW: 12.7 % (ref 11.5–15.5)
WBC: 7.2 10*3/uL (ref 4.0–10.5)

## 2015-09-06 LAB — ABO/RH: ABO/RH(D): A POS

## 2015-09-06 NOTE — Progress Notes (Signed)
08/28/15- CT chest- EPIC

## 2015-09-06 NOTE — Progress Notes (Signed)
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  Father has been on Coumadin in past.  Patient states he is unsure if Dr Leighton Ruff is aware of this.,  I informed patient I would notify Dr Leighton Ruff prior to surgery.

## 2015-09-07 LAB — HEMOGLOBIN A1C
HEMOGLOBIN A1C: 6.6 % — AB (ref 4.8–5.6)
Mean Plasma Glucose: 143 mg/dL

## 2015-09-13 ENCOUNTER — Encounter (HOSPITAL_COMMUNITY)
Admission: RE | Disposition: A | Discharge status: Home / Self Care | Payer: Self-pay | Source: Ambulatory Visit | Attending: General Surgery

## 2015-09-13 ENCOUNTER — Inpatient Hospital Stay (HOSPITAL_COMMUNITY): Payer: PRIVATE HEALTH INSURANCE | Admitting: Anesthesiology

## 2015-09-13 ENCOUNTER — Inpatient Hospital Stay (HOSPITAL_COMMUNITY)
Admission: RE | Admit: 2015-09-13 | Discharge: 2015-09-17 | DRG: 331 | Disposition: A | Discharge status: Home / Self Care | Payer: PRIVATE HEALTH INSURANCE | Source: Ambulatory Visit | Attending: General Surgery | Admitting: General Surgery

## 2015-09-13 ENCOUNTER — Inpatient Hospital Stay (HOSPITAL_COMMUNITY): Payer: PRIVATE HEALTH INSURANCE

## 2015-09-13 ENCOUNTER — Encounter (HOSPITAL_COMMUNITY): Payer: Self-pay | Admitting: *Deleted

## 2015-09-13 DIAGNOSIS — Z800 Family history of malignant neoplasm of digestive organs: Secondary | ICD-10-CM

## 2015-09-13 DIAGNOSIS — C189 Malignant neoplasm of colon, unspecified: Secondary | ICD-10-CM | POA: Diagnosis present

## 2015-09-13 DIAGNOSIS — C187 Malignant neoplasm of sigmoid colon: Secondary | ICD-10-CM | POA: Diagnosis present

## 2015-09-13 DIAGNOSIS — Z87891 Personal history of nicotine dependence: Secondary | ICD-10-CM | POA: Diagnosis not present

## 2015-09-13 DIAGNOSIS — R1031 Right lower quadrant pain: Secondary | ICD-10-CM | POA: Diagnosis present

## 2015-09-13 HISTORY — PX: LAPAROSCOPIC PARTIAL COLECTOMY: SHX5907

## 2015-09-13 HISTORY — PX: CYSTOSCOPY WITH STENT PLACEMENT: SHX5790

## 2015-09-13 HISTORY — PX: PROCTOSCOPY: SHX2266

## 2015-09-13 LAB — TYPE AND SCREEN
ABO/RH(D): A POS
Antibody Screen: NEGATIVE

## 2015-09-13 SURGERY — LAPAROSCOPIC PARTIAL COLECTOMY
Anesthesia: General | Site: Abdomen

## 2015-09-13 MED ORDER — LACTATED RINGERS IV SOLN
INTRAVENOUS | Status: DC
  Administered 2015-09-13: 1000 mL via INTRAVENOUS

## 2015-09-13 MED ORDER — ONDANSETRON HCL 4 MG/2ML IJ SOLN
INTRAMUSCULAR | Status: DC | PRN
  Administered 2015-09-13: 4 mg via INTRAVENOUS

## 2015-09-13 MED ORDER — ONDANSETRON HCL 4 MG/2ML IJ SOLN: 4.0000 mg | Freq: Four times a day (QID) | INTRAMUSCULAR | Status: DC | PRN

## 2015-09-13 MED ORDER — HEPARIN SODIUM (PORCINE) 5000 UNIT/ML IJ SOLN
5000.0000 [IU] | Freq: Three times a day (TID) | INTRAMUSCULAR | Status: DC
  Administered 2015-09-13: 5000 [IU] via SUBCUTANEOUS
  Filled 2015-09-13 (×6): qty 1

## 2015-09-13 MED ORDER — ACETAMINOPHEN 500 MG PO TABS
1000.0000 mg | ORAL_TABLET | Freq: Four times a day (QID) | ORAL | Status: AC
  Administered 2015-09-13 – 2015-09-14 (×4): 1000 mg via ORAL
  Filled 2015-09-13 (×2): qty 2

## 2015-09-13 MED ORDER — BUPIVACAINE-EPINEPHRINE 0.25% -1:200000 IJ SOLN
INTRAMUSCULAR | Status: AC
  Filled 2015-09-13: qty 1

## 2015-09-13 MED ORDER — SUGAMMADEX SODIUM 200 MG/2ML IV SOLN
INTRAVENOUS | Status: DC | PRN
  Administered 2015-09-13: 200 mg via INTRAVENOUS

## 2015-09-13 MED ORDER — ROCURONIUM BROMIDE 100 MG/10ML IV SOLN
INTRAVENOUS | Status: AC
  Filled 2015-09-13: qty 1

## 2015-09-13 MED ORDER — CEFOTETAN DISODIUM-DEXTROSE 2-2.08 GM-% IV SOLR
INTRAVENOUS | Status: AC
  Filled 2015-09-13: qty 50

## 2015-09-13 MED ORDER — ROCURONIUM BROMIDE 100 MG/10ML IV SOLN
INTRAVENOUS | Status: DC | PRN
  Administered 2015-09-13: 50 mg via INTRAVENOUS
  Administered 2015-09-13 (×4): 10 mg via INTRAVENOUS

## 2015-09-13 MED ORDER — DEXAMETHASONE SODIUM PHOSPHATE 10 MG/ML IJ SOLN
INTRAMUSCULAR | Status: AC
  Filled 2015-09-13: qty 1

## 2015-09-13 MED ORDER — METHYLENE BLUE 0.5 % INJ SOLN
INTRAVENOUS | Status: AC
  Filled 2015-09-13: qty 10

## 2015-09-13 MED ORDER — HYDROMORPHONE HCL 2 MG/ML IJ SOLN
INTRAMUSCULAR | Status: AC
  Filled 2015-09-13: qty 1

## 2015-09-13 MED ORDER — LABETALOL HCL 5 MG/ML IV SOLN
INTRAVENOUS | Status: AC
  Filled 2015-09-13: qty 4

## 2015-09-13 MED ORDER — BUPIVACAINE LIPOSOME 1.3 % IJ SUSP
20.0000 mL | Freq: Once | INTRAMUSCULAR | Status: AC
  Administered 2015-09-13: 20 mL
  Filled 2015-09-13: qty 20

## 2015-09-13 MED ORDER — BUPIVACAINE-EPINEPHRINE 0.25% -1:200000 IJ SOLN
INTRAMUSCULAR | Status: DC | PRN
  Administered 2015-09-13: 50 mL

## 2015-09-13 MED ORDER — HYDROMORPHONE HCL 1 MG/ML IJ SOLN
INTRAMUSCULAR | Status: DC | PRN
  Administered 2015-09-13: 1 mg via INTRAVENOUS

## 2015-09-13 MED ORDER — HYDRALAZINE HCL 20 MG/ML IJ SOLN
20.0000 mg | INTRAMUSCULAR | Status: DC | PRN
  Administered 2015-09-13: 20 mg via INTRAVENOUS

## 2015-09-13 MED ORDER — STERILE WATER FOR IRRIGATION IR SOLN
Status: DC | PRN
  Administered 2015-09-13: 1500 mL

## 2015-09-13 MED ORDER — METOPROLOL TARTRATE 5 MG/5ML IV SOLN: 5.0000 mg | Freq: Four times a day (QID) | INTRAVENOUS | Status: DC | PRN

## 2015-09-13 MED ORDER — LACTATED RINGERS IR SOLN
Status: DC | PRN
  Administered 2015-09-13: 1000 mL

## 2015-09-13 MED ORDER — ALVIMOPAN 12 MG PO CAPS
12.0000 mg | ORAL_CAPSULE | Freq: Once | ORAL | Status: AC
  Administered 2015-09-13: 12 mg via ORAL
  Filled 2015-09-13: qty 1

## 2015-09-13 MED ORDER — DIPHENHYDRAMINE HCL 25 MG PO CAPS
25.0000 mg | ORAL_CAPSULE | Freq: Four times a day (QID) | ORAL | Status: DC | PRN
Start: 2015-09-13 — End: 2015-09-17

## 2015-09-13 MED ORDER — MORPHINE SULFATE (PF) 2 MG/ML IV SOLN
2.0000 mg | INTRAVENOUS | Status: DC | PRN
  Administered 2015-09-13 – 2015-09-14 (×8): 4 mg via INTRAVENOUS
  Administered 2015-09-14: 2 mg via INTRAVENOUS
  Administered 2015-09-15: 4 mg via INTRAVENOUS
  Administered 2015-09-15 – 2015-09-17 (×10): 2 mg via INTRAVENOUS
  Filled 2015-09-13: qty 1
  Filled 2015-09-13 (×2): qty 2
  Filled 2015-09-13 (×2): qty 1
  Filled 2015-09-13: qty 2
  Filled 2015-09-13: qty 1
  Filled 2015-09-13: qty 2
  Filled 2015-09-13 (×4): qty 1
  Filled 2015-09-13 (×5): qty 2
  Filled 2015-09-13: qty 1
  Filled 2015-09-13: qty 2
  Filled 2015-09-13: qty 1

## 2015-09-13 MED ORDER — ALVIMOPAN 12 MG PO CAPS
12.0000 mg | ORAL_CAPSULE | Freq: Two times a day (BID) | ORAL | Status: DC
  Administered 2015-09-14 – 2015-09-15 (×4): 12 mg via ORAL
  Filled 2015-09-13 (×7): qty 1

## 2015-09-13 MED ORDER — MIDAZOLAM HCL 5 MG/5ML IJ SOLN
INTRAMUSCULAR | Status: DC | PRN
  Administered 2015-09-13: 2 mg via INTRAVENOUS

## 2015-09-13 MED ORDER — ENOXAPARIN SODIUM 40 MG/0.4ML ~~LOC~~ SOLN
40.0000 mg | SUBCUTANEOUS | Status: DC
  Administered 2015-09-14 – 2015-09-17 (×4): 40 mg via SUBCUTANEOUS
  Filled 2015-09-13 (×5): qty 0.4

## 2015-09-13 MED ORDER — ONDANSETRON HCL 4 MG PO TABS: 4.0000 mg | ORAL_TABLET | Freq: Four times a day (QID) | ORAL | Status: DC | PRN

## 2015-09-13 MED ORDER — HEPARIN SODIUM (PORCINE) 5000 UNIT/ML IJ SOLN
INTRAMUSCULAR | Status: AC
Start: 2015-09-13 — End: 2015-09-13
  Filled 2015-09-13: qty 1

## 2015-09-13 MED ORDER — STERILE WATER FOR IRRIGATION IR SOLN
Status: DC | PRN
  Administered 2015-09-13: 13:00:00 via INTRAVESICAL

## 2015-09-13 MED ORDER — FENTANYL CITRATE (PF) 250 MCG/5ML IJ SOLN
INTRAMUSCULAR | Status: AC
  Filled 2015-09-13: qty 5

## 2015-09-13 MED ORDER — PROPOFOL 10 MG/ML IV BOLUS
INTRAVENOUS | Status: AC
  Filled 2015-09-13: qty 20

## 2015-09-13 MED ORDER — SODIUM CHLORIDE 0.9 % IR SOLN
Status: DC | PRN
  Administered 2015-09-13: 3000 mL

## 2015-09-13 MED ORDER — FENTANYL CITRATE (PF) 100 MCG/2ML IJ SOLN
INTRAMUSCULAR | Status: DC | PRN
  Administered 2015-09-13 (×3): 50 ug via INTRAVENOUS
  Administered 2015-09-13: 100 ug via INTRAVENOUS
  Administered 2015-09-13: 50 ug via INTRAVENOUS
  Administered 2015-09-13: 100 ug via INTRAVENOUS
  Administered 2015-09-13 (×2): 50 ug via INTRAVENOUS

## 2015-09-13 MED ORDER — KCL IN DEXTROSE-NACL 20-5-0.45 MEQ/L-%-% IV SOLN
INTRAVENOUS | Status: DC
  Administered 2015-09-13 – 2015-09-17 (×5): via INTRAVENOUS
  Filled 2015-09-13 (×5): qty 1000

## 2015-09-13 MED ORDER — DEXAMETHASONE SODIUM PHOSPHATE 10 MG/ML IJ SOLN
INTRAMUSCULAR | Status: DC | PRN
  Administered 2015-09-13: 5 mg via INTRAVENOUS

## 2015-09-13 MED ORDER — LIDOCAINE HCL (CARDIAC) 20 MG/ML IV SOLN
INTRAVENOUS | Status: DC | PRN
  Administered 2015-09-13: 60 mg via INTRAVENOUS

## 2015-09-13 MED ORDER — SUCCINYLCHOLINE CHLORIDE 20 MG/ML IJ SOLN
INTRAMUSCULAR | Status: DC | PRN
  Administered 2015-09-13: 160 mg via INTRAVENOUS

## 2015-09-13 MED ORDER — MIDAZOLAM HCL 2 MG/2ML IJ SOLN
INTRAMUSCULAR | Status: AC
  Filled 2015-09-13: qty 2

## 2015-09-13 MED ORDER — LACTATED RINGERS IV SOLN: INTRAVENOUS | Status: DC

## 2015-09-13 MED ORDER — HYDROMORPHONE HCL 1 MG/ML IJ SOLN
INTRAMUSCULAR | Status: AC
  Filled 2015-09-13: qty 1

## 2015-09-13 MED ORDER — CETYLPYRIDINIUM CHLORIDE 0.05 % MT LIQD
7.0000 mL | Freq: Two times a day (BID) | OROMUCOSAL | Status: DC
  Administered 2015-09-13 – 2015-09-15 (×4): 7 mL via OROMUCOSAL

## 2015-09-13 MED ORDER — LACTATED RINGERS IV SOLN
INTRAVENOUS | Status: DC | PRN
  Administered 2015-09-13 (×2): via INTRAVENOUS

## 2015-09-13 MED ORDER — HYDROMORPHONE HCL 1 MG/ML IJ SOLN
0.2500 mg | INTRAMUSCULAR | Status: DC | PRN
  Administered 2015-09-13 (×2): 0.5 mg via INTRAVENOUS

## 2015-09-13 MED ORDER — DEXTROSE 5 % IV SOLN
2.0000 g | INTRAVENOUS | Status: AC
  Administered 2015-09-13: 2 g via INTRAVENOUS

## 2015-09-13 MED ORDER — DEXTROSE 5 % IV SOLN
2.0000 g | Freq: Two times a day (BID) | INTRAVENOUS | Status: AC
  Administered 2015-09-13: 2 g via INTRAVENOUS
  Filled 2015-09-13: qty 2

## 2015-09-13 MED ORDER — PROPOFOL 10 MG/ML IV BOLUS
INTRAVENOUS | Status: DC | PRN
  Administered 2015-09-13: 200 mg via INTRAVENOUS

## 2015-09-13 MED ORDER — DIPHENHYDRAMINE HCL 50 MG/ML IJ SOLN: 25.0000 mg | Freq: Four times a day (QID) | INTRAMUSCULAR | Status: DC | PRN

## 2015-09-13 MED ORDER — LABETALOL HCL 5 MG/ML IV SOLN
INTRAVENOUS | Status: DC | PRN
  Administered 2015-09-13 (×3): 5 mg via INTRAVENOUS

## 2015-09-13 MED ORDER — ONDANSETRON HCL 4 MG/2ML IJ SOLN
INTRAMUSCULAR | Status: AC
  Filled 2015-09-13: qty 2

## 2015-09-13 MED ORDER — SUGAMMADEX SODIUM 200 MG/2ML IV SOLN
INTRAVENOUS | Status: AC
  Filled 2015-09-13: qty 2

## 2015-09-13 MED ORDER — LIDOCAINE HCL (CARDIAC) 20 MG/ML IV SOLN
INTRAVENOUS | Status: AC
  Filled 2015-09-13: qty 5

## 2015-09-13 MED ORDER — HYDRALAZINE HCL 20 MG/ML IJ SOLN
INTRAMUSCULAR | Status: AC
  Filled 2015-09-13: qty 1

## 2015-09-13 MED ORDER — 0.9 % SODIUM CHLORIDE (POUR BTL) OPTIME
TOPICAL | Status: DC | PRN
  Administered 2015-09-13: 2000 mL

## 2015-09-13 SURGICAL SUPPLY — 96 items
ADAPTER GOLDBERG URETERAL (ADAPTER) ×5 IMPLANT
APPLIER CLIP 5 13 M/L LIGAMAX5 (MISCELLANEOUS)
BAG URO CATCHER STRL LF (MISCELLANEOUS) ×5 IMPLANT
BASKET ZERO TIP NITINOL 2.4FR (BASKET) IMPLANT
BLADE EXTENDED COATED 6.5IN (ELECTRODE) IMPLANT
CABLE HIGH FREQUENCY MONO STRZ (ELECTRODE) ×5 IMPLANT
CATH INTERMIT  6FR 70CM (CATHETERS) ×10 IMPLANT
CELLS DAT CNTRL 66122 CELL SVR (MISCELLANEOUS) IMPLANT
CHLORAPREP W/TINT 26ML (MISCELLANEOUS) ×5 IMPLANT
CLIP APPLIE 5 13 M/L LIGAMAX5 (MISCELLANEOUS) IMPLANT
CLIP TI LARGE 6 (CLIP) ×5 IMPLANT
CLOTH BEACON ORANGE TIMEOUT ST (SAFETY) IMPLANT
COUNTER NEEDLE 20 DBL MAG RED (NEEDLE) ×5 IMPLANT
COVER MAYO STAND STRL (DRAPES) ×15 IMPLANT
COVER SURGICAL LIGHT HANDLE (MISCELLANEOUS) IMPLANT
DECANTER SPIKE VIAL GLASS SM (MISCELLANEOUS) ×5 IMPLANT
DEVICE TROCAR PUNCTURE CLOSURE (ENDOMECHANICALS) ×5 IMPLANT
DRAIN CHANNEL 19F RND (DRAIN) IMPLANT
DRAPE LAPAROSCOPIC ABDOMINAL (DRAPES) ×5 IMPLANT
DRAPE SURG IRRIG POUCH 19X23 (DRAPES) ×5 IMPLANT
DRSG OPSITE POSTOP 4X10 (GAUZE/BANDAGES/DRESSINGS) IMPLANT
DRSG OPSITE POSTOP 4X6 (GAUZE/BANDAGES/DRESSINGS) IMPLANT
DRSG OPSITE POSTOP 4X8 (GAUZE/BANDAGES/DRESSINGS) ×5 IMPLANT
ELECT PENCIL ROCKER SW 15FT (MISCELLANEOUS) ×10 IMPLANT
ELECT REM PT RETURN 15FT ADLT (MISCELLANEOUS) ×5 IMPLANT
ENDOLOOP SUT PDS II  0 18 (SUTURE)
ENDOLOOP SUT PDS II 0 18 (SUTURE) IMPLANT
EVACUATOR SILICONE 100CC (DRAIN) IMPLANT
GAUZE SPONGE 4X4 12PLY STRL (GAUZE/BANDAGES/DRESSINGS) IMPLANT
GLOVE BIO SURGEON STRL SZ 6.5 (GLOVE) ×8 IMPLANT
GLOVE BIO SURGEONS STRL SZ 6.5 (GLOVE) ×2
GLOVE BIOGEL M STRL SZ7.5 (GLOVE) ×5 IMPLANT
GLOVE BIOGEL PI IND STRL 7.0 (GLOVE) ×6 IMPLANT
GLOVE BIOGEL PI INDICATOR 7.0 (GLOVE) ×4
GOWN STRL REUS W/TWL 2XL LVL3 (GOWN DISPOSABLE) ×10 IMPLANT
GOWN STRL REUS W/TWL LRG LVL3 (GOWN DISPOSABLE) ×5 IMPLANT
GOWN STRL REUS W/TWL XL LVL3 (GOWN DISPOSABLE) ×45 IMPLANT
GUIDEWIRE ANG ZIPWIRE 038X150 (WIRE) ×5 IMPLANT
GUIDEWIRE STR DUAL SENSOR (WIRE) ×5 IMPLANT
HANDLE SUCTION POOLE (INSTRUMENTS) IMPLANT
L-HOOK LAP DISP 36CM (ELECTROSURGICAL) ×5
LEGGING LITHOTOMY PAIR STRL (DRAPES) ×5 IMPLANT
LHOOK LAP DISP 36CM (ELECTROSURGICAL) ×3 IMPLANT
LIGASURE IMPACT 36 18CM CVD LR (INSTRUMENTS) IMPLANT
LIQUID BAND (GAUZE/BANDAGES/DRESSINGS) ×5 IMPLANT
LUBRICANT JELLY K Y 4OZ (MISCELLANEOUS) ×5 IMPLANT
MANIFOLD NEPTUNE II (INSTRUMENTS) ×10 IMPLANT
PACK COLON (CUSTOM PROCEDURE TRAY) ×5 IMPLANT
PACK CYSTO (CUSTOM PROCEDURE TRAY) ×5 IMPLANT
PAD POSITIONING PINK XL (MISCELLANEOUS) ×5 IMPLANT
PORT LAP GEL ALEXIS MED 5-9CM (MISCELLANEOUS) ×5 IMPLANT
POSITIONER SURGICAL ARM (MISCELLANEOUS) ×10 IMPLANT
RTRCTR WOUND ALEXIS 18CM MED (MISCELLANEOUS)
SCISSORS LAP 5X35 DISP (ENDOMECHANICALS) ×5 IMPLANT
SEALER TISSUE G2 STRG ARTC 35C (ENDOMECHANICALS) ×5 IMPLANT
SET IRRIG TUBING LAPAROSCOPIC (IRRIGATION / IRRIGATOR) ×5 IMPLANT
SET IRRIG Y TYPE TUR BLADDER L (SET/KITS/TRAYS/PACK) ×5 IMPLANT
SLEEVE XCEL OPT CAN 5 100 (ENDOMECHANICALS) ×10 IMPLANT
SPONGE LAP 18X18 X RAY DECT (DISPOSABLE) ×5 IMPLANT
STAPLER CUT CVD 40MM BLUE (STAPLE) ×5 IMPLANT
STAPLER CUT RELOAD GREEN (STAPLE) ×5 IMPLANT
STAPLER VISISTAT 35W (STAPLE) IMPLANT
SUCTION POOLE HANDLE (INSTRUMENTS)
SUT ETHILON 2 0 PS N (SUTURE) IMPLANT
SUT NOVA 1 T20/GS 25DT (SUTURE) ×10 IMPLANT
SUT NOVA NAB DX-16 0-1 5-0 T12 (SUTURE) IMPLANT
SUT PDS AB 1 CTX 36 (SUTURE) IMPLANT
SUT PDS AB 1 TP1 96 (SUTURE) IMPLANT
SUT PROLENE 2 0 KS (SUTURE) ×5 IMPLANT
SUT SILK 2 0 (SUTURE) ×2
SUT SILK 2 0 SH CR/8 (SUTURE) ×5 IMPLANT
SUT SILK 2-0 18XBRD TIE 12 (SUTURE) ×3 IMPLANT
SUT SILK 3 0 (SUTURE) ×2
SUT SILK 3 0 SH CR/8 (SUTURE) ×5 IMPLANT
SUT SILK 3-0 18XBRD TIE 12 (SUTURE) ×3 IMPLANT
SUT VIC AB 2-0 SH 18 (SUTURE) ×10 IMPLANT
SUT VIC AB 4-0 PS2 27 (SUTURE) ×10 IMPLANT
SUT VICRYL 0 UR6 27IN ABS (SUTURE) ×10 IMPLANT
SUT VICRYL 2 0 18  UND BR (SUTURE)
SUT VICRYL 2 0 18 UND BR (SUTURE) IMPLANT
SYR BULB IRRIGATION 50ML (SYRINGE) ×5 IMPLANT
SYR CONTROL 10ML LL (SYRINGE) ×5 IMPLANT
SYS LAPSCP GELPORT 120MM (MISCELLANEOUS)
SYSTEM LAPSCP GELPORT 120MM (MISCELLANEOUS) IMPLANT
TAPE CLOTH 4X10 WHT NS (GAUZE/BANDAGES/DRESSINGS) ×5 IMPLANT
TOWEL OR 17X26 10 PK STRL BLUE (TOWEL DISPOSABLE) IMPLANT
TOWEL OR NON WOVEN STRL DISP B (DISPOSABLE) ×5 IMPLANT
TRAY FOLEY W/METER SILVER 14FR (SET/KITS/TRAYS/PACK) IMPLANT
TRAY FOLEY W/METER SILVER 16FR (SET/KITS/TRAYS/PACK) ×5 IMPLANT
TROCAR BLADELESS OPT 5 100 (ENDOMECHANICALS) ×5 IMPLANT
TROCAR XCEL 12X100 BLDLESS (ENDOMECHANICALS) ×5 IMPLANT
TROCAR XCEL BLUNT TIP 100MML (ENDOMECHANICALS) IMPLANT
TROCAR XCEL NON-BLD 11X100MML (ENDOMECHANICALS) IMPLANT
TUBING CONNECTING 10 (TUBING) ×4 IMPLANT
TUBING CONNECTING 10' (TUBING) ×1
TUBING INSUF HEATED (TUBING) ×5 IMPLANT

## 2015-09-13 NOTE — Anesthesia Procedure Notes (Signed)
Procedure Name: Intubation Date/Time: 09/13/2015 9:35 AM Performed by: Gaston Islam EVETTE Pre-anesthesia Checklist: Patient identified, Emergency Drugs available, Suction available and Patient being monitored Patient Re-evaluated:Patient Re-evaluated prior to inductionOxygen Delivery Method: Circle system utilized Preoxygenation: Pre-oxygenation with 100% oxygen Intubation Type: IV induction Ventilation: Mask ventilation without difficulty Laryngoscope Size: Miller and 3 Grade View: Grade I Tube type: Oral Tube size: 7.5 mm Number of attempts: 1 Airway Equipment and Method: Patient positioned with wedge pillow Placement Confirmation: ETT inserted through vocal cords under direct vision,  breath sounds checked- equal and bilateral,  positive ETCO2 and CO2 detector Secured at: 23 cm Tube secured with: Tape Dental Injury: Teeth and Oropharynx as per pre-operative assessment

## 2015-09-13 NOTE — Interval H&P Note (Signed)
History and Physical Interval Note:  09/13/2015 8:15 AM  Marc King  has presented today for surgery, with the diagnosis of colon cancer  The various methods of treatment have been discussed with the patient and family. After consideration of risks, benefits and other options for treatment, the patient has consented to  Procedure(s): LAPAROSCOPIC PARTIAL COLECTOMY (N/A) CYSTOSCOPY WITH RIGHT STENT PLACEMENT (Right) as a surgical intervention .  The patient's history has been reviewed, patient examined, no change in status, stable for surgery.  I have reviewed the patient's chart and labs.  Questions were answered to the patient's satisfaction.     Rosario Adie, MD  [facility]

## 2015-09-13 NOTE — Consult Note (Signed)
Consult: Colon cancer-specifically for ureteral stenting  Requested by: Dr. Marcello Moores  History of Present Illness: patient is a 40 year old white male with a large sigmoid mass with surrounding mesenteric stranding right greater than left. Ureteral stenting was requested. Patient denies prior cystoscopy and ureteral stenting. Prior UA was clear. Normal kidney function. It was no hydronephrosis on the CT scan and/or PET scans. I reviewed the images.   Past Medical History  Diagnosis Date  . Scarlet fever   . 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 Vasectomy      Home Medications:  Prescriptions prior to admission  Medication Sig Dispense Refill Last Dose  . acetaminophen (TYLENOL) 500 MG tablet Take 1,000 mg by mouth every 6 (six) hours as needed (For pain.).    Past Week at Unknown time  . ibuprofen (ADVIL,MOTRIN) 200 MG tablet Take 200-400 mg by mouth every 6 (six) hours as needed (For pain.).   Past Week at Unknown time   Allergies: No Known Allergies  Family History  Problem Relation Age of Onset  . Melanoma Father   . Leukemia Mother   . Diabetes Mother   . Colitis Sister   . Irritable bowel syndrome Sister   . Celiac disease Sister   . Clotting disorder Father   . Heart failure Maternal Grandmother     during surgery  . Anuerysm Paternal Grandmother     brain  . Anuerysm Maternal Grandfather     stomach   Social History:  reports that he quit smoking about 11 years ago. His smoking use included Cigarettes. He has never used smokeless tobacco. He reports that he does not drink alcohol or use illicit drugs.  ROS: A complete review of systems was performed.  All systems are negative except for pertinent findings as noted. ROS   Physical Exam:  Vital signs in last 24 hours: Temp:  [97.9 F (36.6 C)] 97.9 F (36.6 C) (06/15 0730) Pulse Rate:  [71] 71 (06/15 0730) Resp:  [18] 18 (06/15 0730) BP: (133)/(96) 133/96  mmHg (06/15 0730) SpO2:  [100 %] 100 % (06/15 0730) Weight:  [107.502 kg (237 lb)] 107.502 kg (237 lb) (06/15 0741) General:  Alert and oriented, No acute distress HEENT: Normocephalic, atraumatic Cardiovascular: Regular rate and rhythm Lungs: Regular rate and effort Neurologic: Grossly intact  Laboratory Data:  No results found for this or any previous visit (from the past 24 hour(s)). No results found for this or any previous visit (from the past 240 hour(s)). Creatinine:  Recent Labs  09/06/15 0940  CREATININE 0.73   I reviewed the chart, labs, CT imaging. I discussed the patient with Dr. Marcello Moores.  Impression/Assessment/plan: I discussed with the patient the nature, potential benefits, risks and alternatives to cystoscopy with retrograde pyelograms and bilateral ureteral stent placement as well as foley catheter placement, including side effects of the proposed treatment, the likelihood of the patient achieving the goals of the procedure, and any potential problems that might occur during the procedure or recuperation. All questions answered. Patient elects to proceed.     Nekisha Mcdiarmid 09/13/2015, 9:33 AM

## 2015-09-13 NOTE — Transfer of Care (Signed)
Immediate Anesthesia Transfer of Care Note  Patient: Marc King  Procedure(s) Performed: Procedure(s): LAPAROSCOPIC SIGMOIDECTOMY (N/A) CYSTOSCOPY WITH BILATERAL STENT PLACEMENT (Bilateral) PROCTOSCOPY  Patient Location: PACU  Anesthesia Type:General  Level of Consciousness: responds to stimulation  Airway & Oxygen Therapy: Patient Spontanous Breathing  Post-op Assessment: Report given to RN  Post vital signs: stable  Last Vitals:  Filed Vitals:   09/13/15 0730  BP: 133/96  Pulse: 71  Temp: 36.6 C  Resp: 18    Last Pain: There were no vitals filed for this visit.    Patients Stated Pain Goal: 5 ([id number])  Complications: No apparent anesthesia complications

## 2015-09-13 NOTE — Op Note (Signed)
Preoperative diagnosis: Colon cancer Postoperative diagnosis: Colon cancer  Procedure: Cystoscopy with bilateral retrograde pyelogram and bilateral ureteral stent placement  Surgeon: Junious Silk  Anesthesia: Gen.  Indication for procedure: 40 year old with large sigmoid mass and straining of the retroperitoneum and mesocolon Right greater than left. Discussed with patient bilateral ureteral stent placement and he elected to proceed. Discussed with Dr. Marcello Moores.   Findings: On exam under anesthesia the penis and foreskin were normal without mass or lesion. The testicles were descended bilaterally and palpably normal. On digital rectal exam the prostate was smooth and benign without hard area or nodule.  Cystoscopy revealed a normal urethra, prostate urethra and an unremarkable bladder. The trigone and ureteral orifices were in the normal orthotopic position with clear efflux.  Right retrograde pyelogram-this outlined a single ureter single collecting system unit without filling defect, stricture or dilation.  Left retrograde pyelogram-this outlined a single ureter single collecting system unit without filling defect, stricture or dilation.  Description of procedure: After consent was obtained patient brought to the operating room. After adequate anesthesia he is placed in lithotomy position. An exam under anesthesia was performed. He was prepped and draped in the usual sterile fashion. The cystoscope was passed per urethra and the bladder inspected. The 6 [place] open-ended catheter required guidance with the wire to access the distal ureter. The wire was removed and retrograde injection of contrast was performed. The wire was then advanced to the renal pelvis and the 6 [place] open-ended catheter advanced over the wire and positioned in the proximal ureter. The wire was removed. The scope was broken and removed maintaining the 6 [place] open-ended catheter in place.  The cystoscope was repassed adjacent  to the catheter and the left ureter was accessed and retrograde injection of contrast was performed in a similar fashion. The wire was advanced and the 6 [place] open-ended catheter positioned in the left proximal ureter. The scope was broken and removed leaving the stents in place.   A 16 French Foley catheter was then advanced the balloon inflated and left gravity drainage. A Goldberg adapter was used to drain the stents and the Foley and the one bag. The right stent was labeled with one piece of tape and the left stent labeled with 2 pieces of tape. Final imaging was obtained to ensure both stents were in the proximal ureter. A fourth piece of tape was used to secure both stents to the Foley catheter.   Complications: None Blood loss: 0 Specimens: None  Drains: Bilateral externalized 6 [place] open-ended catheter/stent; 16 French Foley catheter  Disposition: Patient turned over to the care of Dr. Marcello Moores for her procedure.

## 2015-09-13 NOTE — Anesthesia Preprocedure Evaluation (Signed)
Anesthesia Evaluation  Patient identified by MRN, date of birth, ID band Patient awake    Reviewed: Allergy & Precautions, H&P , NPO status , Patient's Chart, lab work & pertinent test results  Airway Mallampati: II  TM Distance: >3 FB Neck ROM: full    Dental no notable dental hx. (+) Dental Advisory Given, Teeth Intact   Pulmonary neg pulmonary ROS, former smoker,    Pulmonary exam normal breath sounds clear to auscultation       Cardiovascular Exercise Tolerance: Good negative cardio ROS Normal cardiovascular exam Rhythm:regular Rate:Normal     Neuro/Psych negative neurological ROS  negative psych ROS   GI/Hepatic negative GI ROS, Neg liver ROS,   Endo/Other  negative endocrine ROS  Renal/GU negative Renal ROS  negative genitourinary   Musculoskeletal   Abdominal   Peds  Hematology negative hematology ROS (+)   Anesthesia Other Findings   Reproductive/Obstetrics negative OB ROS                             Anesthesia Physical Anesthesia Plan  ASA: II  Anesthesia Plan: General   Post-op Pain Management:    Induction: Intravenous  Airway Management Planned: Oral ETT  Additional Equipment:   Intra-op Plan:   Post-operative Plan: Extubation in OR  Informed Consent: I have reviewed the patients History and Physical, chart, labs and discussed the procedure including the risks, benefits and alternatives for the proposed anesthesia with the patient or authorized representative who has indicated his/her understanding and acceptance.   Dental Advisory Given  Plan Discussed with: CRNA  Anesthesia Plan Comments:         Anesthesia Quick Evaluation

## 2015-09-13 NOTE — Anesthesia Postprocedure Evaluation (Signed)
Anesthesia Post Note  Patient: COLEBY BORGHESE  Procedure(s) Performed: Procedure(s) (LRB): LAPAROSCOPIC SIGMOIDECTOMY (N/A) CYSTOSCOPY WITH BILATERAL STENT PLACEMENT (Bilateral) PROCTOSCOPY  Patient location during evaluation: PACU Anesthesia Type: General Level of consciousness: awake and alert Pain management: pain level controlled Vital Signs Assessment: post-procedure vital signs reviewed and stable Respiratory status: spontaneous breathing, nonlabored ventilation, respiratory function stable and patient connected to nasal cannula oxygen Cardiovascular status: blood pressure returned to baseline and stable Postop Assessment: no signs of nausea or vomiting Anesthetic complications: no    Last Vitals:  Filed Vitals:   09/13/15 1415 09/13/15 1441  BP: 157/95 152/84  Pulse: 89 90  Temp: 36.9 C 36.9 C  Resp: 24 20    Last Pain:  Filed Vitals:   09/13/15 1441  PainSc: Asleep                 Eunice Winecoff L

## 2015-09-13 NOTE — H&P (View-Only) (Signed)
History of Present Illness Marc King; [id number] 10:00 AM) The patient is a 40 year old male who presents with colorectal cancer. 40 year old male who presents to the office for evaluation of a newly diagnosed sigmoid colon cancer. He has been having some right lower quadrant pain over the past few months and a CT scan of the abdomen and pelvis was performed. This showed an inflamed portion of sigmoid colon concerning for diverticulitis versus neoplasm. He was placed on antibiotics and received a colonoscopy approximately 4 weeks later. Colonoscopy revealed a sigmoid colon mass and a cecal polyp. Cecal polyp was noted to be benign. Sigmoid mass was noted to be adenocarcinoma. The patient still has some crampy abdominal pain that is relieved with bowel movements. He has occasional blood in his stools but most of this was after his colonoscopy. He denies any weight loss. He has no past medical history or past surgical history. His only family history of colon cancer is in a great-grandmother.   Other Problems [name], [name]; 08/21/2015 9:27 AM) Back Pain  Past Surgical History [name], [name]; 08/21/2015 9:27 AM) Vasectomy  Diagnostic Studies History [name], [place]; 08/21/2015 9:27 AM) Colonoscopy within last year  Allergies [name], [name]; 08/21/2015 9:28 AM) No Known Drug Allergies 08/21/2015  Medication History [name], [name]; 08/21/2015 9:28 AM) No Current Medications Medications Reconciled  Social History [name], [name]; 08/21/2015 9:27 AM) Alcohol use Remotely quit alcohol use. Caffeine use Carbonated beverages, Coffee. Illicit drug use Remotely quit drug use. Tobacco use Former smoker.  Family History [name], [place]; 08/21/2015 9:27 AM) Cancer Mother. Diabetes Mellitus Mother, Sister. Hypertension Mother, Sister. Melanoma Father.     Review of Systems [name] [name]; 08/21/2015 9:27 AM) General Present- Fatigue. Not Present-  Appetite Loss, Chills, Fever, Night Sweats, Weight Gain and Weight Loss. Skin Not Present- Change in Wart/Mole, Dryness, Hives, Jaundice, New Lesions, Non-Healing Wounds, Rash and Ulcer. HEENT Not Present- Earache, Hearing Loss, Hoarseness, Nose Bleed, Oral Ulcers, Ringing in the Ears, Seasonal Allergies, Sinus Pain, Sore Throat, Visual Disturbances, Wears glasses/contact lenses and Yellow Eyes. Breast Not Present- Breast Mass, Breast Pain, Nipple Discharge and Skin Changes. Cardiovascular Not Present- Chest Pain, Difficulty Breathing Lying Down, Leg Cramps, Palpitations, Rapid Heart Rate, Shortness of Breath and Swelling of Extremities. Gastrointestinal Present- Abdominal Pain, Bloating, Bloody Stool, Change in Bowel Habits, Gets full quickly at meals and Nausea. Not Present- Chronic diarrhea, Constipation, Difficulty Swallowing, Excessive gas, Hemorrhoids, Indigestion, Rectal Pain and Vomiting. Male Genitourinary Not Present- Blood in Urine, Change in Urinary Stream, Frequency, Impotence, Nocturia, Painful Urination, Urgency and Urine Leakage. Musculoskeletal Present- Joint Pain. Not Present- Back Pain, Joint Stiffness, Muscle Pain, Muscle Weakness and Swelling of Extremities. Neurological Not Present- Decreased Memory, Fainting, Headaches, Numbness, Seizures, Tingling, Tremor, Trouble walking and Weakness. Psychiatric Not Present- Anxiety, Bipolar, Change in Sleep Pattern, Depression, Fearful and Frequent crying. Endocrine Present- New Diabetes. Not Present- Cold Intolerance, Excessive Hunger, Hair Changes, Heat Intolerance and Hot flashes. Hematology Not Present- Easy Bruising, Excessive bleeding, Gland problems, HIV and Persistent Infections.  Vitals [name] [name]; 08/21/2015 9:28 AM) 08/21/2015 9:28 AM Weight: 239 lb Height: 71in Body Surface Area: 2.27 m Body Mass Index: 33.33 kg/m  Temp.: 97.11F  Pulse: 80 (Regular)  BP: 130/76 (Sitting, Left Arm,  Standard)      Physical Exam Marc King; [id number] 10:00 AM)  General Mental Status-Alert. General Appearance-Not in acute distress. Build & Nutrition-Well nourished. Posture-Normal posture. Gait-Normal.  Head and Neck Head-normocephalic, atraumatic with  no lesions or palpable masses. Trachea-midline.  Chest and Lung Exam Chest and lung exam reveals -on auscultation, normal breath sounds, no adventitious sounds and normal vocal resonance.  Cardiovascular Cardiovascular examination reveals -normal heart sounds, regular rate and rhythm with no murmurs.  Abdomen Inspection Inspection of the abdomen reveals - No Hernias. Palpation/Percussion Palpation and Percussion of the abdomen reveal - Soft, Non Tender, No Rigidity (guarding), No hepatosplenomegaly and No Palpable abdominal masses.  Neurologic Neurologic evaluation reveals -alert and oriented x 3 with no impairment of recent or remote memory, normal attention span and ability to concentrate, normal sensation and normal coordination.  Musculoskeletal Normal Exam - Bilateral-Upper Extremity Strength Normal and Lower Extremity Strength Normal.    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 CT scan concerning for neoplasm versus diverticulitis. Colonoscopy performed showed a sigmoid colon mass and a cecal polyp which was resected. Biopsy shows sigmoid adenocarcinoma. Patient has no signs of metastatic disease on his CT abdomen. We will get a CT scan of his chest. We will also get a CEA level with his preoperative labs to complete his work up. On CT scan the tumor does appear to be close to the right ureter with some inflammation around the area. I think that it will dissect off of this area easily but I have recommended placing a right ureteral stent  preoperatively to avoid any unknown injury. The surgery and anatomy were described to the patient as well as the risks of surgery and the possible complications. These include: Bleeding, deep abdominal infections and possible wound complications such as hernia and infection, damage to adjacent structures, leak of surgical connections, which can lead to other surgeries and possibly an ostomy, possible need for other procedures, such as abscess drains in radiology, possible prolonged hospital stay, possible diarrhea from removal of part of the colon, possible constipation from narcotics, possible bowel, bladder or sexual dysfunction if having rectal surgery, prolonged fatigue/weakness or appetite loss, possible early recurrence of of disease, possible complications of their medical problems such as heart disease or arrhythmias or lung problems, death (less than 1%). I believe the patient understands and wishes to proceed with the surgery.

## 2015-09-13 NOTE — Op Note (Signed)
09/13/2015  1:10 PM  PATIENT:  Marc King  40 y.o. male  Patient Care Team: Hayden Rasmussen, MD as PCP - General (Family Medicine) Tania Ade, RN as Registered Nurse  PRE-OPERATIVE DIAGNOSIS:  colon cancer  POST-OPERATIVE DIAGNOSIS:  colon cancer  PROCEDURE:  LAPAROSCOPIC SIGMOIDECTOMY CYSTOSCOPY WITH BILATERAL STENT PLACEMENT PROCTOSCOPY  Surgeon(s): Leighton Ruff, MD Festus Aloe, MD Greer Pickerel, MD  ASSISTANT: Dr Redmond Pulling   ANESTHESIA:   local and general  EBL:  Total I/O In: 2000 [I.V.:2000] Out: 450 [Urine:250; Blood:200]  Delay start of Pharmacological VTE agent (>24hrs) due to surgical blood loss or risk of bleeding:  no  DRAINS: none   SPECIMEN:  Source of Specimen:  Sigmoid colon  DISPOSITION OF SPECIMEN:  PATHOLOGY  COUNTS:  YES  PLAN OF CARE: Admit to inpatient   PATIENT DISPOSITION:  PACU - hemodynamically stable.  INDICATION:    40 year old male who presents to the office with a sigmoid colon mass. Biopsy showed adenocarcinoma.  I recommended segmental resection:  The anatomy & physiology of the digestive tract was discussed.  The pathophysiology was discussed.  Natural history risks without surgery was discussed.   I worked to give an overview of the disease and the frequent need to have multispecialty involvement.  I feel the risks of no intervention will lead to serious problems that outweigh the operative risks; therefore, I recommended a partial colectomy to remove the pathology.  Laparoscopic & open techniques were discussed.   Risks such as bleeding, infection, abscess, leak, reoperation, possible ostomy, hernia, heart attack, death, and other risks were discussed.  I noted a good likelihood this will help address the problem.   Goals of post-operative recovery were discussed as well.    The patient expressed understanding & wished to proceed with surgery.  OR FINDINGS:   Patient had large sigmoid colon tumor adherent to the pelvic  sidewalls and anterior peritoneum.  No obvious metastatic disease on visceral parietal peritoneum or liver.  The anastomosis rests ~15 cm from the anal verge by rigid proctoscopy.  DESCRIPTION:   Informed consent was confirmed.  The patient underwent general anaesthesia without difficulty.  The patient was positioned appropriately.  VTE prevention in place.  The patient's abdomen was clipped, prepped, & draped in a sterile fashion.  Surgical timeout confirmed our plan.  The patient was positioned in reverse Trendelenburg.  Abdominal entry was gained using the varies needle in the left upper quadrant.  I induced carbon dioxide insufflation.  A 5 mm was placed in the right upper quadrant. Camera inspection revealed no injury.  Extra ports were carefully placed under direct laparoscopic visualization, including a 12 mm port in the right lower quadrant.  Upon entry into the abdomen I evaluated the patient's pelvis. There was a large tumor adherent to the anterior and right lateral pelvic walls.  I reflected the greater omentum and the upper abdomen the small bowel in the upper abdomen. I scored the base of peritoneum of the right side of the mesentery of the left colon from the ligament of Treitz to the peritoneal reflection of the mid rectum.   I elevated the sigmoid mesentery and enetered into the retro-mesenteric plane. I was not able to elevate this much due to the size of the tumor. I decided to mobilize the left lateral plane. This was done using careful dissection staying on top of the retroperitoneal border. I dissected up to the splenic flexure using the laparoscopic Enseal device.  We were  able to identify the left ureter and gonadal vessels. We kept those posterior within the retroperitoneum and elevated the left colon mesentery off that.  I then entered into the pelvis on the left side carefully dissecting the peritoneum at the border of the colon mass. I was able to free this portion of the  colon without much difficulty. I then terminated attention to the anterior border. The tumor was densely adherent to the anterior peritoneum. I dissected the peritoneum off of the bladder and took the peritoneum and colon tumor en bloc. After the anterior border was secured I freed the right lateral sidewall as much as I could using dissection and electrocautery. Was unable to get a clear view of the ureter behind this even using the stents. I decided to make a lower midline incision and palpate the stents directly. After this was done an IT sales professional was placed. The right and left ureters were palpated and secured away from the dissection margins. I bluntly mobilized the remaining portion off of the right lateral pelvic inlet. I sharply took a portion of the lateral border next to the right ureter and sent this for frozen specimen. There were some atypical cells noted, but no signs of malignant cells. I placed 3 metal clips in the area of the closest dissection plane. I then took the proximal colon using a blue load contour stapler. I used the Enseal device to dissect down to the level of the inferior mesenteric artery.   I skeletonized the inferior mesenteric artery pedicle.  I went down to its takeoff from the aorta.  After confirming the left ureter was out of the way, I went ahead and ligated the inferior mesenteric artery pedicle with a Kelly clamp and then bipolar EnSeal ~2cm above its takeoff from the aorta. I suture ligated the stump. Remaining lymph node material was resected away from the stump and sent to pathology with the specimen. We ensured hemostasis. I skeletonized the mesorectum at the junction at the proximal rectum using blunt dissection & electrocautery.  I then used the contour device with a green load stapler to dissect the rectosigmoid junction. The pelvis was packed for hemostasis. The specimen was marked with the distal suture and sent to pathology for further examination. We  filled the bladder with methylene blue to evaluate for any leaks as the tumor was closely adherent to the bladder. There was no leak but there was an area that was thinned. I used 2-0 Vicryl sutures to secure this area and placed peritoneum over it. We then removed the packing from the pelvis. The pelvis was hemostatic at this point. The abdomen was irrigated with warm sterile water. The proximal colon was brought out through the abdominal wound and a pursestring device was placed on the age. A 2-0 Prolene pursestring was created. This was secured with 3-0 silk sutures. A 29 mm EEA anvil was placed into the proximal colon and the pursestring was tied tightly around this. An anastomosis was created without difficulty using the EEA stapler. There was no leak when tested with insufflation under water. There was no tension on the anastomosis. The ureteral stents were then removed. We then switched to clean gowns, gloves, drapes and instruments. Xparel was placed in the preperitoneal layer of the extraction site for added pain control.  The 12 mm port site was closed using a 0 Vicryl suture. The posterior layer of the extraction site and the peritoneum was closed using a running 2-0 Vicryl suture. The  anterior layer was closed using interrupted #1 Novafil sutures. The subcutaneous tissue was reapproximated using 2-0 Vicryl sutures. The skin was closed using a 4-0 Vicryl running subcuticular suture. Port sites were also closed using 4-0 Vicryl sutures. Dermabond was placed over the port sites and a dressing was placed over the extraction site. Patient tolerated this well and was awakened from anesthesia and sent to the post anesthesia care unit stable condition. All counts were correct per operating room staff.

## 2015-09-14 LAB — BASIC METABOLIC PANEL
ANION GAP: 4 — AB (ref 5–15)
BUN: 8 mg/dL (ref 6–20)
CO2: 27 mmol/L (ref 22–32)
Calcium: 8.5 mg/dL — ABNORMAL LOW (ref 8.9–10.3)
Chloride: 106 mmol/L (ref 101–111)
Creatinine, Ser: 0.87 mg/dL (ref 0.61–1.24)
Glucose, Bld: 194 mg/dL — ABNORMAL HIGH (ref 65–99)
POTASSIUM: 3.9 mmol/L (ref 3.5–5.1)
SODIUM: 137 mmol/L (ref 135–145)

## 2015-09-14 LAB — CBC
HEMATOCRIT: 37.5 % — AB (ref 39.0–52.0)
HEMOGLOBIN: 12.6 g/dL — AB (ref 13.0–17.0)
MCH: 27.5 pg (ref 26.0–34.0)
MCHC: 33.6 g/dL (ref 30.0–36.0)
MCV: 81.7 fL (ref 78.0–100.0)
Platelets: 359 10*3/uL (ref 150–400)
RBC: 4.59 MIL/uL (ref 4.22–5.81)
RDW: 13.3 % (ref 11.5–15.5)
WBC: 13.1 10*3/uL — AB (ref 4.0–10.5)

## 2015-09-14 LAB — GLUCOSE, CAPILLARY: Glucose-Capillary: 148 mg/dL — ABNORMAL HIGH (ref 65–99)

## 2015-09-14 NOTE — Progress Notes (Signed)
1 Day Post-Op Lap Sigmoidectomy Subjective: No acute events overnight, pain ok.  No nausea.    Objective: Vital signs in last 24 hours: Temp:  [98 F (36.7 C)-98.6 F (37 C)] 98.6 F (37 C) (06/16 0516) Pulse Rate:  [77-100] 81 (06/16 0516) Resp:  [15-24] 18 (06/16 0516) BP: (133-164)/(82-106) 134/87 mmHg (06/16 0516) SpO2:  [98 %-100 %] 98 % (06/16 0516) Weight:  [107.502 kg (237 lb)] 107.502 kg (237 lb) (06/15 0741)   Intake/Output from previous day: 06/15 0701 - 06/16 0700 In: 3664 [P.O.:9; I.V.:3655] Out: 2800 [Urine:2600; Blood:200] Intake/Output this shift:     General appearance: alert and cooperative GI: soft, non-distended  Incision: no significant drainage  Lab Results:   Recent Labs  09/14/15 0412  WBC 13.1*  HGB 12.6*  HCT 37.5*  PLT 359   BMET  Recent Labs  09/14/15 0412  NA 137  K 3.9  CL 106  CO2 27  GLUCOSE 194*  BUN 8  CREATININE 0.87  CALCIUM 8.5*   PT/INR No results for input(s): LABPROT, INR in the last 72 hours. ABG No results for input(s): PHART, HCO3 in the last 72 hours.  Invalid input(s): PCO2, PO2  MEDS, Scheduled . acetaminophen  1,000 mg Oral Q6H  . alvimopan  12 mg Oral BID  . antiseptic oral rinse  7 mL Mouth Rinse BID  . enoxaparin (LOVENOX) injection  40 mg Subcutaneous Q24H    Studies/Results: Dg C-arm 1-60 Min-no Report  09/13/2015  CLINICAL DATA: surger C-ARM 1-60 MINUTES Fluoroscopy was utilized by the requesting physician.  No radiographic interpretation.    Assessment: s/p Procedure(s): LAPAROSCOPIC SIGMOIDECTOMY CYSTOSCOPY WITH BILATERAL STENT PLACEMENT PROCTOSCOPY Patient Active Problem List   Diagnosis Date Noted  . Colon cancer ([place]) 09/13/2015  . Cancer of sigmoid colon ([place]) 08/07/2015    Expected post op course  Plan: Advance diet to liquids as tolerated Decrease MIV Ambulate Foley out tom    LOS: 1 day     .Rosario Adie, [facility],  [place]   09/14/2015 7:32 AM

## 2015-09-14 NOTE — Discharge Instructions (Addendum)
ABDOMINAL SURGERY: POST OP INSTRUCTIONS ° °1. DIET: Follow a light bland diet the first 24 hours after arrival home, such as soup, liquids, crackers, etc.  Be sure to include lots of fluids daily.  Avoid fast food or heavy meals as your are more likely to get nauseated.  Do not eat any uncooked fruits or vegetables for the next 2 weeks as your colon heals. °2. Take your usually prescribed home medications unless otherwise directed. °3. PAIN CONTROL: °a. Pain is best controlled by a usual combination of three different methods TOGETHER: °i. Ice/Heat °ii. Over the counter pain medication °iii. Prescription pain medication °b. Most patients will experience some swelling and bruising around the incisions.  Ice packs or heating pads (30-60 minutes up to 6 times a day) will help. Use ice for the first few days to help decrease swelling and bruising, then switch to heat to help relax tight/sore spots and speed recovery.  Some people prefer to use ice alone, heat alone, alternating between ice & heat.  Experiment to what works for you.  Swelling and bruising can take several weeks to resolve.   °c. It is helpful to take an over-the-counter pain medication regularly for the first few weeks.  Choose one of the following that works best for you: °i. Naproxen (Aleve, etc)  Two 220mg tabs twice a day °ii. Ibuprofen (Advil, etc) Three 200mg tabs four times a day (every meal & bedtime) °iii. Acetaminophen (Tylenol, etc) 500-650mg four times a day (every meal & bedtime) °d. A  prescription for pain medication (such as oxycodone, hydrocodone, etc) should be given to you upon discharge.  Take your pain medication as prescribed.  °i. If you are having problems/concerns with the prescription medicine (does not control pain, nausea, vomiting, rash, itching, etc), please call us [phone] to see if we need to switch you to a different pain medicine that will work better for you and/or control your side effect better. °ii. If you  need a refill on your pain medication, please contact your pharmacy.  They will contact our office to request authorization. Prescriptions will not be filled after 5 pm or on week-ends. °4. Avoid getting constipated.  Between the surgery and the pain medications, it is common to experience some constipation.  Increasing fluid intake and taking a fiber supplement (such as Metamucil, Citrucel, FiberCon, MiraLax, etc) 1-2 times a day regularly will usually help prevent this problem from occurring.  A mild laxative (prune juice, Milk of Magnesia, MiraLax, etc) should be taken according to package directions if there are no bowel movements after 48 hours.   °5. Watch out for diarrhea.  If you have many loose bowel movements, simplify your diet to bland foods & liquids for a few days.  Stop any stool softeners and decrease your fiber supplement.  Switching to mild anti-diarrheal medications (Kayopectate, Pepto Bismol) can help.  If this worsens or does not improve, please call us. °6. Wash / shower every day.  You may shower over the incision / wound.  Avoid baths until the skin is fully healed.  Continue to shower over incision(s) after the dressing is off. °7. Remove your waterproof bandages 5 days after surgery.  You may leave the incision open to air.  You may replace a dressing/Band-Aid to cover the incision for comfort if you wish. °8. ACTIVITIES as tolerated:   °a. You may resume regular (light) daily activities beginning the next day--such as daily self-care, walking, climbing stairs--gradually increasing activities as   tolerated.  If you can walk 30 minutes without difficulty, it is safe to try more intense activity such as jogging, treadmill, bicycling, low-impact aerobics, swimming, etc. b. Save the most intensive and strenuous activity for last such as sit-ups, heavy lifting, contact sports, etc  Refrain from any heavy lifting or straining until you are off narcotics for pain control.   c. DO NOT PUSH THROUGH  PAIN.  Let pain be your guide: If it hurts to do something, don't do it.  Pain is your body warning you to avoid that activity for another week until the pain goes down. d. You may drive when you are no longer taking prescription pain medication, you can comfortably wear a seatbelt, and you can safely maneuver your car and apply brakes. e. Dennis Bast may have sexual intercourse when it is comfortable.  9. FOLLOW UP in our office a. Please call [facility] at [phone] to set up an appointment to see your surgeon in the office for a follow-up appointment approximately 1-2 weeks after your surgery. b. Make sure that you call for this appointment the day you arrive home to insure a convenient appointment time. 10. IF YOU HAVE DISABILITY OR FAMILY LEAVE FORMS, BRING THEM TO THE OFFICE FOR PROCESSING.  DO NOT GIVE THEM TO YOUR DOCTOR.   WHEN TO CALL us [phone]: 1. Poor pain control 2. Reactions / problems with new medications (rash/itching, nausea, etc)  3. Fever over 101.5 F (38.5 C) 4. Inability to urinate 5. Nausea and/or vomiting 6. Worsening swelling or bruising 7. Continued bleeding from incision. 8. Increased pain, redness, or drainage from the incision  The clinic staff is available to answer your questions during regular business hours (8:30am-5pm).  Please dont hesitate to call and ask to speak to one of our nurses for clinical concerns.   A surgeon from [facility] is always on call at the hospitals   If you have a medical emergency, go to the nearest emergency room or call 911.    [facility], [place], [place], [place], [place]  [address] ? MAIN: [phone] ? TOLL FREE: [phone] ? FAX [phone] [email or web address]  Foley Catheter Care, Adult A Foley catheter is a soft, flexible tube that is placed into the bladder to drain urine. A Foley catheter may be inserted if:  You leak urine or are not able to control  when you urinate (urinary incontinence).  You are not able to urinate when you need to (urinary retention).  You had prostate surgery or surgery on the genitals.  You have certain medical conditions, such as multiple sclerosis, dementia, or a spinal cord injury. If you are going home with a Foley catheter in place, follow the instructions below. TAKING CARE OF THE CATHETER 1. Wash your hands with soap and water. 2. Using mild soap and warm water on a clean washcloth:  Clean the area on your body closest to the catheter insertion site using a circular motion, moving away from the catheter. Never wipe toward the catheter because this could sweep bacteria up into the urethra and cause infection.  Remove all traces of soap. Pat the area dry with a clean towel. For males, reposition the foreskin. 3. Attach the catheter to your leg so there is no tension on the catheter. Use adhesive tape or a leg strap. If you are using adhesive tape, remove any sticky residue left behind by the previous tape you used. 4. Keep the drainage  bag below the level of the bladder, but keep it off the floor. 5. Check throughout the day to be sure the catheter is working and urine is draining freely. Make sure the tubing does not become kinked. 6. Do not pull on the catheter or try to remove it. Pulling could damage internal tissues. TAKING CARE OF THE DRAINAGE BAGS You will be given two drainage bags to take home. One is a large overnight drainage bag, and the other is a smaller leg bag that fits underneath clothing. You may wear the overnight bag at any time, but you should never wear the smaller leg bag at night. Follow the instructions below for how to empty, change, and clean your drainage bags. Emptying the Drainage Bag You must empty your drainage bag when it is  - full or at least 2-3 times a day. 1. Wash your hands with soap and water. 2. Keep the drainage bag below your hips, below the level of your bladder.  This stops urine from going back into the tubing and into your bladder. 3. Hold the dirty bag over the toilet or a clean container. 4. Open the pour spout at the bottom of the bag and empty the urine into the toilet or container. Do not let the pour spout touch the toilet, container, or any other surface. Doing so can place bacteria on the bag, which can cause an infection. 5. Clean the pour spout with a gauze pad or cotton ball that has rubbing alcohol on it. 6. Close the pour spout. 7. Attach the bag to your leg with adhesive tape or a leg strap. 8. Wash your hands well. Changing the Drainage Bag Change your drainage bag once a month or sooner if it starts to smell bad or look dirty. Below are steps to follow when changing the drainage bag. 1. Wash your hands with soap and water. 2. Pinch off the rubber catheter so that urine does not spill out. 3. Disconnect the catheter tube from the drainage tube at the connection valve. Do not let the tubes touch any surface. 4. Clean the end of the catheter tube with an alcohol wipe. Use a different alcohol wipe to clean the end of the drainage tube. 5. Connect the catheter tube to the drainage tube of the clean drainage bag. 6. Attach the new bag to the leg with adhesive tape or a leg strap. Avoid attaching the new bag too tightly. 7. Wash your hands well. Cleaning the Drainage Bag 1. Wash your hands with soap and water. 2. Wash the bag in warm, soapy water. 3. Rinse the bag thoroughly with warm water. 4. Fill the bag with a solution of white vinegar and water (1 cup vinegar to 1 qt warm water [.2 L vinegar to 1 L warm water]). Close the bag and soak it for 30 minutes in the solution. 5. Rinse the bag with warm water. 6. Hang the bag to dry with the pour spout open and hanging downward. 7. Store the clean bag (once it is dry) in a clean plastic bag. 8. Wash your hands well. PREVENTING INFECTION  Wash your hands before and after handling your  catheter.  Take showers daily and wash the area where the catheter enters your body. Do not take baths. Replace wet leg straps with dry ones, if this applies.  Do not use powders, sprays, or lotions on the genital area. Only use creams, lotions, or ointments as directed by your caregiver.  For females, wipe  from front to back after each bowel movement.  Drink enough fluids to keep your urine clear or pale yellow unless you have a fluid restriction.  Do not let the drainage bag or tubing touch or lie on the floor.  Wear cotton underwear to absorb moisture and to keep your skin drier. SEEK MEDICAL CARE IF:   Your urine is cloudy or smells unusually bad.  Your catheter becomes clogged.  You are not draining urine into the bag or your bladder feels full.  Your catheter starts to leak. SEEK IMMEDIATE MEDICAL CARE IF:   You have pain, swelling, redness, or pus where the catheter enters the body.  You have pain in the abdomen, legs, lower back, or bladder.  You have a fever.  You see blood fill the catheter, or your urine is pink or red.  You have nausea, vomiting, or chills.  Your catheter gets pulled out. MAKE SURE YOU:   Understand these instructions.  Will watch your condition.  Will get help right away if you are not doing well or get worse.   This information is not intended to replace advice given to you by your health care provider. Make sure you discuss any questions you have with your health care provider.   Document Released: 03/17/2005 Document Revised: 08/01/2013 Document Reviewed: 03/08/2012 Elsevier Interactive Patient Education [employer].

## 2015-09-15 LAB — BASIC METABOLIC PANEL
Anion gap: 5 (ref 5–15)
BUN: 6 mg/dL (ref 6–20)
CHLORIDE: 105 mmol/L (ref 101–111)
CO2: 28 mmol/L (ref 22–32)
CREATININE: 0.8 mg/dL (ref 0.61–1.24)
Calcium: 8.5 mg/dL — ABNORMAL LOW (ref 8.9–10.3)
GFR calc Af Amer: >60 mL/min (ref >60)
GFR calc non Af Amer: >60 mL/min (ref >60)
GLUCOSE: 138 mg/dL — AB (ref 65–99)
POTASSIUM: 3.9 mmol/L (ref 3.5–5.1)
SODIUM: 138 mmol/L (ref 135–145)

## 2015-09-15 LAB — CBC
HEMATOCRIT: 37.2 % — AB (ref 39.0–52.0)
Hemoglobin: 11.9 g/dL — ABNORMAL LOW (ref 13.0–17.0)
MCH: 27.4 pg (ref 26.0–34.0)
MCHC: 32 g/dL (ref 30.0–36.0)
MCV: 85.5 fL (ref 78.0–100.0)
PLATELETS: 339 10*3/uL (ref 150–400)
RBC: 4.35 MIL/uL (ref 4.22–5.81)
RDW: 13.6 % (ref 11.5–15.5)
WBC: 11.8 10*3/uL — ABNORMAL HIGH (ref 4.0–10.5)

## 2015-09-15 NOTE — Progress Notes (Signed)
Patient ID: Marc King, male   DOB: [birth year], 40 y.o.   MRN: [id number] [facility] Surgery Progress Note:   2 Days Post-Op  Subjective: Mental status is clear.   Objective: Vital signs in last 24 hours: Temp:  [97.9 F (36.6 C)-98.8 F (37.1 C)] 97.9 F (36.6 C) (06/17 0557) Pulse Rate:  [71-76] 75 (06/17 0557) Resp:  [18] 18 (06/17 0557) BP: (140-148)/(81-87) 148/87 mmHg (06/17 0557) SpO2:  [99 %-100 %] 100 % (06/17 0557)  Intake/Output from previous day: 06/16 0701 - 06/17 0700 In: 1350 [P.O.:960; I.V.:390] Out: 1950 [Urine:1950] Intake/Output this shift:    Physical Exam: Work of breathing is normal.  Incisions ok.  Foley in place and to remain post fistula takedown  Lab Results:  Results for orders placed or performed during the hospital encounter of 09/13/15 (from the past 48 hour(s))  Basic metabolic panel     Status: Abnormal   Collection Time: 09/14/15  4:12 AM  Result Value Ref Range   Sodium 137 135 - 145 mmol/L   Potassium 3.9 3.5 - 5.1 mmol/L   Chloride 106 101 - 111 mmol/L   CO2 27 22 - 32 mmol/L   Glucose, Bld 194 (H) 65 - 99 mg/dL   BUN 8 6 - 20 mg/dL   Creatinine, Ser 0.87 0.61 - 1.24 mg/dL   Calcium 8.5 (L) 8.9 - 10.3 mg/dL   GFR calc non Af Amer >60 >60 mL/min   GFR calc Af Amer >60 >60 mL/min    Comment: (NOTE) The eGFR has been calculated using the CKD EPI equation. This calculation has not been validated in all clinical situations. eGFR's persistently <60 mL/min signify possible Chronic Kidney Disease.    Anion gap 4 (L) 5 - 15  CBC     Status: Abnormal   Collection Time: 09/14/15  4:12 AM  Result Value Ref Range   WBC 13.1 (H) 4.0 - 10.5 K/uL   RBC 4.59 4.22 - 5.81 MIL/uL   Hemoglobin 12.6 (L) 13.0 - 17.0 g/dL   HCT 37.5 (L) 39.0 - 52.0 %   MCV 81.7 78.0 - 100.0 fL   MCH 27.5 26.0 - 34.0 pg   MCHC 33.6 30.0 - 36.0 g/dL   RDW 13.3 11.5 - 15.5 %   Platelets 359 150 - 400 K/uL  Glucose, capillary     Status: Abnormal   Collection  Time: 09/14/15  4:25 PM  Result Value Ref Range   Glucose-Capillary 148 (H) 65 - 99 mg/dL  Basic metabolic panel     Status: Abnormal   Collection Time: 09/15/15  4:24 AM  Result Value Ref Range   Sodium 138 135 - 145 mmol/L   Potassium 3.9 3.5 - 5.1 mmol/L   Chloride 105 101 - 111 mmol/L   CO2 28 22 - 32 mmol/L   Glucose, Bld 138 (H) 65 - 99 mg/dL   BUN 6 6 - 20 mg/dL   Creatinine, Ser 0.80 0.61 - 1.24 mg/dL   Calcium 8.5 (L) 8.9 - 10.3 mg/dL   GFR calc non Af Amer >60 >60 mL/min   GFR calc Af Amer >60 >60 mL/min    Comment: (NOTE) The eGFR has been calculated using the CKD EPI equation. This calculation has not been validated in all clinical situations. eGFR's persistently <60 mL/min signify possible Chronic Kidney Disease.    Anion gap 5 5 - 15  CBC     Status: Abnormal   Collection Time: 09/15/15  4:24 AM  Result Value Ref Range   WBC 11.8 (H) 4.0 - 10.5 K/uL   RBC 4.35 4.22 - 5.81 MIL/uL   Hemoglobin 11.9 (L) 13.0 - 17.0 g/dL   HCT 37.2 (L) 39.0 - 52.0 %   MCV 85.5 78.0 - 100.0 fL   MCH 27.4 26.0 - 34.0 pg   MCHC 32.0 30.0 - 36.0 g/dL   RDW 13.6 11.5 - 15.5 %   Platelets 339 150 - 400 K/uL    Radiology/Results: Dg C-arm 1-60 Min-no Report  09/13/2015  CLINICAL DATA: surger C-ARM 1-60 MINUTES Fluoroscopy was utilized by the requesting physician.  No radiographic interpretation.    Anti-infectives: Anti-infectives    Start     Dose/Rate Route Frequency Ordered Stop   09/13/15 2100  cefoTEtan (CEFOTAN) 2 g in dextrose 5 % 50 mL IVPB     2 g 100 mL/hr over 30 Minutes Intravenous Every 12 hours 09/13/15 1526 09/13/15 2106   09/13/15 0741  cefoTEtan (CEFOTAN) 2 g in dextrose 5 % 50 mL IVPB     2 g 100 mL/hr over 30 Minutes Intravenous On call to O.R. 09/13/15 0741 09/13/15 0924      Assessment/Plan: Problem List: Patient Active Problem List   Diagnosis Date Noted  . Colon cancer ([place]) 09/13/2015  . Cancer of sigmoid colon ([place]) 08/07/2015    Doing well  thus far.  There appears to be some confusion with leaving the catheter in place post op.  Will leave in for now.   2 Days Post-Op    LOS: 2 days   Matt B. Hassell Done, MD, [facility] [phone] beeper [phone]  09/15/2015 8:22 AM

## 2015-09-16 LAB — BASIC METABOLIC PANEL
ANION GAP: 5 (ref 5–15)
BUN: 8 mg/dL (ref 6–20)
CALCIUM: 8.7 mg/dL — AB (ref 8.9–10.3)
CO2: 29 mmol/L (ref 22–32)
Chloride: 103 mmol/L (ref 101–111)
Creatinine, Ser: 0.79 mg/dL (ref 0.61–1.24)
GFR calc Af Amer: >60 mL/min (ref >60)
GFR calc non Af Amer: >60 mL/min (ref >60)
GLUCOSE: 148 mg/dL — AB (ref 65–99)
POTASSIUM: 4.1 mmol/L (ref 3.5–5.1)
Sodium: 137 mmol/L (ref 135–145)

## 2015-09-16 LAB — CBC
HEMATOCRIT: 36.5 % — AB (ref 39.0–52.0)
HEMOGLOBIN: 11.9 g/dL — AB (ref 13.0–17.0)
MCH: 27.6 pg (ref 26.0–34.0)
MCHC: 32.6 g/dL (ref 30.0–36.0)
MCV: 84.7 fL (ref 78.0–100.0)
Platelets: 327 10*3/uL (ref 150–400)
RBC: 4.31 MIL/uL (ref 4.22–5.81)
RDW: 13.5 % (ref 11.5–15.5)
WBC: 9.4 10*3/uL (ref 4.0–10.5)

## 2015-09-16 NOTE — Progress Notes (Signed)
Pharmacy Brief Note - Alvimopan (Entereg)  The standing order set for alvimopan (Entereg) includes an automatic order to discontinue the drug after the patient has had a bowel movement. The change was approved by the [place] and the Medical Executive Committee.   This patient has had bowel movements documented by nursing. Therefore, alvimopan has been discontinued. If there are questions, please contact the pharmacy at [phone].   Thank you-  Doreene Eland, PharmD, BCPS.   Pager: [phone] 09/16/2015 2:15 PM

## 2015-09-16 NOTE — Progress Notes (Signed)
Patient ID: Marc King, male   DOB: Feb 03, 1976, 40 y.o.   MRN: [id number] [facility] Surgery Progress Note:   3 Days Post-Op  Subjective: Mental status is clear.   Objective: Vital signs in last 24 hours: Temp:  [98.2 F (36.8 C)-99 F (37.2 C)] 98.2 F (36.8 C) (06/18 0459) Pulse Rate:  [62-81] 62 (06/18 0459) Resp:  [14-18] 14 (06/18 0459) BP: (122-146)/(80-86) 133/81 mmHg (06/18 0459) SpO2:  [98 %-100 %] 100 % (06/18 0459)  Intake/Output from previous day: 06/17 0701 - 06/18 0700 In: 2040 [P.O.:1200; I.V.:840] Out: 3675 [Urine:3675] Intake/Output this shift:    Physical Exam: Work of breathing is normal.  Foley in place and will stay until seen back in office.  Not quite ready to go home today.  Will advance diet.    Lab Results:  Results for orders placed or performed during the hospital encounter of 09/13/15 (from the past 48 hour(s))  Glucose, capillary     Status: Abnormal   Collection Time: 09/14/15  4:25 PM  Result Value Ref Range   Glucose-Capillary 148 (H) 65 - 99 mg/dL  Basic metabolic panel     Status: Abnormal   Collection Time: 09/15/15  4:24 AM  Result Value Ref Range   Sodium 138 135 - 145 mmol/L   Potassium 3.9 3.5 - 5.1 mmol/L   Chloride 105 101 - 111 mmol/L   CO2 28 22 - 32 mmol/L   Glucose, Bld 138 (H) 65 - 99 mg/dL   BUN 6 6 - 20 mg/dL   Creatinine, Ser 0.80 0.61 - 1.24 mg/dL   Calcium 8.5 (L) 8.9 - 10.3 mg/dL   GFR calc non Af Amer >60 >60 mL/min   GFR calc Af Amer >60 >60 mL/min    Comment: (NOTE) The eGFR has been calculated using the CKD EPI equation. This calculation has not been validated in all clinical situations. eGFR's persistently <60 mL/min signify possible Chronic Kidney Disease.    Anion gap 5 5 - 15  CBC     Status: Abnormal   Collection Time: 09/15/15  4:24 AM  Result Value Ref Range   WBC 11.8 (H) 4.0 - 10.5 K/uL   RBC 4.35 4.22 - 5.81 MIL/uL   Hemoglobin 11.9 (L) 13.0 - 17.0 g/dL   HCT 37.2 (L) 39.0 - 52.0 %   MCV 85.5 78.0 - 100.0 fL   MCH 27.4 26.0 - 34.0 pg   MCHC 32.0 30.0 - 36.0 g/dL   RDW 13.6 11.5 - 15.5 %   Platelets 339 150 - 400 K/uL  Basic metabolic panel     Status: Abnormal   Collection Time: 09/16/15  3:58 AM  Result Value Ref Range   Sodium 137 135 - 145 mmol/L   Potassium 4.1 3.5 - 5.1 mmol/L   Chloride 103 101 - 111 mmol/L   CO2 29 22 - 32 mmol/L   Glucose, Bld 148 (H) 65 - 99 mg/dL   BUN 8 6 - 20 mg/dL   Creatinine, Ser 0.79 0.61 - 1.24 mg/dL   Calcium 8.7 (L) 8.9 - 10.3 mg/dL   GFR calc non Af Amer >60 >60 mL/min   GFR calc Af Amer >60 >60 mL/min    Comment: (NOTE) The eGFR has been calculated using the CKD EPI equation. This calculation has not been validated in all clinical situations. eGFR's persistently <60 mL/min signify possible Chronic Kidney Disease.    Anion gap 5 5 - 15  CBC  Status: Abnormal   Collection Time: 09/16/15  3:58 AM  Result Value Ref Range   WBC 9.4 4.0 - 10.5 K/uL   RBC 4.31 4.22 - 5.81 MIL/uL   Hemoglobin 11.9 (L) 13.0 - 17.0 g/dL   HCT 36.5 (L) 39.0 - 52.0 %   MCV 84.7 78.0 - 100.0 fL   MCH 27.6 26.0 - 34.0 pg   MCHC 32.6 30.0 - 36.0 g/dL   RDW 13.5 11.5 - 15.5 %   Platelets 327 150 - 400 K/uL    Radiology/Results: No results found.  Anti-infectives: Anti-infectives    Start     Dose/Rate Route Frequency Ordered Stop   09/13/15 2100  cefoTEtan (CEFOTAN) 2 g in dextrose 5 % 50 mL IVPB     2 g 100 mL/hr over 30 Minutes Intravenous Every 12 hours 09/13/15 1526 09/13/15 2106   09/13/15 0741  cefoTEtan (CEFOTAN) 2 g in dextrose 5 % 50 mL IVPB     2 g 100 mL/hr over 30 Minutes Intravenous On call to O.R. 09/13/15 0741 09/13/15 0924      Assessment/Plan: Problem List: Patient Active Problem List   Diagnosis Date Noted  . Colon cancer ([place]) 09/13/2015  . Cancer of sigmoid colon ([place]) 08/07/2015    Advance diet.   3 Days Post-Op    LOS: 3 days   Matt B. Hassell Done, MD, [facility] [phone]  beeper [phone]  09/16/2015 8:57 AM

## 2015-09-17 MED ORDER — OXYCODONE HCL 5 MG PO TABS
5.0000 mg | ORAL_TABLET | ORAL | Status: DC | PRN
  Administered 2015-09-17: 5 mg via ORAL
  Filled 2015-09-17: qty 1

## 2015-09-17 MED ORDER — OXYCODONE HCL 5 MG PO TABS: 5.0000 mg | ORAL_TABLET | ORAL | Status: DC | PRN

## 2015-09-17 NOTE — Discharge Summary (Signed)
Physician Discharge Summary  Marc King [id number] DOB: Jul 22, 1975 DOA: 09/13/2015  PCP: Hayden Rasmussen., MD  Admit date: 09/13/2015 Discharge date: 09/17/2015  Recommendations for Outpatient Follow-up:   Follow-up Information    Follow up with Rosario Adie., MD. Schedule an appointment as soon as possible for a visit in 2 weeks.   Specialty:  General Surgery   Contact information:   [place] [address] [phone]      Discharge Diagnoses:  1. Sigmoid colon cancer s/p resection  Surgical Procedure: LAPAROSCOPIC SIGMOIDECTOMY CYSTOSCOPY WITH BILATERAL STENT PLACEMENT PROCTOSCOPY  Discharge Condition: good Disposition: home with foley leg bag  Diet recommendation: regular  Filed Weights   09/13/15 0741  Weight: 107.502 kg (237 lb)    Hospital Course:  Pt came in for planned sigmoid colectomy due to large tumor. Urology placed stents at time of surgery. Tumor was near bladder and ureter. Intraoperatively, the tumor was densely adherent to the bladder and part of the muscle layer was resected en bloc with the colon. There was no leak from the bladder and the wall was reinforced with suture. Urology recommended leaving the foley in for 10 days. He was maintained on perioperative chemical vte prophylaxis and entereg. He regained bowel function on POD 3. On POD 4 he was tolerating a diet, had had several BMs, ambulating well, stable vitals, been instructed on foley care and was deemed safe for dc. We had discussed dc instructions.   BP 127/85 mmHg  Pulse 65  Temp(Src) 97.7 F (36.5 C) (Oral)  Resp 16  Ht 5\' 11"  (1.803 m)  Wt 107.502 kg (237 lb)  BMI 33.07 kg/m2  SpO2 100%  Gen: alert, NAD, non-toxic appearing Pupils: equal, no scleral icterus Pulm: symmetric chest rise CV: regular rate and rhythm Abd: soft, fairly nontender, nondistended.  No cellulitis. No incisional hernia Ext: no edema, no calf tenderness Skin: no rash, no  jaundice    Discharge Instructions     Medication List    ASK your doctor about these medications        acetaminophen 500 MG tablet  Commonly known as:  TYLENOL  Take 1,000 mg by mouth every 6 (six) hours as needed (For pain.).     ibuprofen 200 MG tablet  Commonly known as:  ADVIL,MOTRIN  Take 200-400 mg by mouth every 6 (six) hours as needed (For pain.).           Follow-up Information    Follow up with Rosario Adie., MD. Schedule an appointment as soon as possible for a visit in 2 weeks.   Specialty:  General Surgery   Contact information:   [place] [address] [phone]        The results of significant diagnostics from this hospitalization (including imaging, microbiology, ancillary and laboratory) are listed below for reference.    Significant Diagnostic Studies: Ct Chest W Contrast  08/28/2015  CLINICAL DATA:  Colon cancer. EXAM: CT CHEST WITH CONTRAST TECHNIQUE: Multidetector CT imaging of the chest was performed during intravenous contrast administration. CONTRAST:  48mL ISOVUE-300 IOPAMIDOL (ISOVUE-300) INJECTION 61% COMPARISON:  Abdomen CT 07/05/2015. FINDINGS: Mediastinum / Lymph Nodes: There is no axillary lymphadenopathy. Small lymph nodes are seen scattered in the mediastinum, but are not enlarged ranging in size from 5-7 mm short axis diameter. Small lymph nodes are also seen in both hilar regions with 1 of the dominant hilar lymph nodes seen on the right (image  81 series 2) measuring 12 mm short axis. The heart size is normal. No pericardial effusion. The esophagus has normal imaging features. Lungs / Pleura: Multiple bilateral pulmonary nodules are identified. Many of these measure in the 2-5 mm size range. 8 mm nodule is identified in the left infrahilar region (see image 70 series 5). 6 mm posterior left lower lobe nodule is seen on image 100 of series 5. 11 mm irregular nodule is identified at the confluence of the major  and minor fissures of the right lung (image 79 series 5). Upper Abdomen: As described on the recent abdomen and pelvis CT , borderline lymphadenopathy is seen in the hepatoduodenal ligament. 13 mm short axis portacaval lymph node on image 155 of series 2 today was 12 mm previously. MSK / Soft Tissues: Bone windows reveal no worrisome lytic or sclerotic osseous lesions. IMPRESSION: 1. Numerous scattered tiny bilateral pulmonary nodules. Metastatic disease is a concern. 2. Borderline enlarged right hilar lymph node. 3. Mild hepatoduodenal ligament lymphadenopathy in the upper abdomen. Electronically Signed   By: Misty Stanley M.D.   On: 08/28/2015 09:31   Nm Pet Image Initial (pi) Skull Base To Thigh  09/05/2015  CLINICAL DATA:  Initial treatment strategy for colon cancer. EXAM: NUCLEAR MEDICINE PET SKULL BASE TO THIGH TECHNIQUE: 11.7 mCi F-18 FDG was injected intravenously. Full-ring PET imaging was performed from the skull base to thigh after the radiotracer. CT data was obtained and used for attenuation correction and anatomic localization. FASTING BLOOD GLUCOSE:  Value: 130 mg/dl COMPARISON:  CT chest 08/28/2015 and CT abdomen pelvis 07/05/2015. FINDINGS: NECK No hypermetabolic lymph nodes the neck. CT images show no acute findings. CHEST No hypermetabolic mediastinal, hilar or axillary lymph nodes. There are a few scattered pulmonary nodules which do not show abnormal hypermetabolism. These are too small for PET resolution, however. No pericardial or pleural effusion. ABDOMEN/PELVIS Sigmoid colon mass measures approximately 4.3 x 8.6 cm and has an SUV max of 18.4. There are numerous lymph nodes in the sigmoid mesocolon, most of which are too small for PET resolution. An 8 mm right external iliac chain lymph node (CT image 180) has an SUV max of 5.2. Hypermetabolic lymph nodes track superiorly along the retroperitoneum, extending to the aortocaval station at the level of the renal arteries (CT image 133). Index  lymph node at the level of the aortic bifurcation measures 12 mm (CT image 162) with an SUV max of 4.5. Hepatoduodenal lymph node measures 10 mm (CT image 114) with an SUV max of 3.6. No abnormal hypermetabolism in the liver, adrenal glands, spleen or pancreas. CT images show the liver, gallbladder, adrenal glands to be grossly unremarkable. Sub cm low-attenuation lesion off the right kidney is too small to characterize. Punctate renal stones bilaterally. Spleen, pancreas, stomach and bowel are otherwise unremarkable. SKELETON No abnormal osseous hypermetabolism. IMPRESSION: 1. Sigmoid colon carcinoma with local/regional adenopathy as well as hypermetabolic adenopathy in the abdominal retroperitoneum and possibly hepatoduodenal ligament. 2. Pulmonary nodules are too small for PET resolution. While metastatic disease cannot be excluded, it is considered less likely in the absence of hepatic metastatic disease. 3. Punctate renal stones. Electronically Signed   By: Lorin Picket M.D.   On: 09/05/2015 09:14   Dg C-arm 1-60 Min-no Report  09/13/2015  CLINICAL DATA: surger C-ARM 1-60 MINUTES Fluoroscopy was utilized by the requesting physician.  No radiographic interpretation.    Microbiology: No results found for this or any previous visit (from the past 240 hour(s)).  Labs: Basic Metabolic Panel:  Recent Labs Lab 09/14/15 0412 09/15/15 0424 09/16/15 0358  NA 137 138 137  K 3.9 3.9 4.1  CL 106 105 103  CO2 27 28 29   GLUCOSE 194* 138* 148*  BUN 8 6 8   CREATININE 0.87 0.80 0.79  CALCIUM 8.5* 8.5* 8.7*   Liver Function Tests: No results for input(s): AST, ALT, ALKPHOS, BILITOT, PROT, ALBUMIN in the last 168 hours. No results for input(s): LIPASE, AMYLASE in the last 168 hours. No results for input(s): AMMONIA in the last 168 hours. CBC:  Recent Labs Lab 09/14/15 0412 09/15/15 0424 09/16/15 0358  WBC 13.1* 11.8* 9.4  HGB 12.6* 11.9* 11.9*  HCT 37.5* 37.2* 36.5*  MCV 81.7 85.5 84.7   PLT 359 339 327   Cardiac Enzymes: No results for input(s): CKTOTAL, CKMB, CKMBINDEX, TROPONINI in the last 168 hours. BNP: BNP (last 3 results) No results for input(s): BNP in the last 8760 hours.  ProBNP (last 3 results) No results for input(s): PROBNP in the last 8760 hours.  CBG:  Recent Labs Lab 09/14/15 1625  GLUCAP 148*    Active Problems:   Colon cancer [facility])   Time coordinating discharge: 20 minutes  Signed:  Gayland Curry, MD [facility], [place] [phone] 09/17/2015, 10:01 AM

## 2015-09-17 NOTE — Progress Notes (Signed)
RN reviewed discharge instructions with paitent and family. All questions answered.   Paperwork and prescriptions given.   NT rolled patient down with all belongings to family car.

## 2015-09-24 ENCOUNTER — Encounter (HOSPITAL_COMMUNITY): Payer: Self-pay

## 2015-09-24 ENCOUNTER — Telehealth: Payer: Self-pay | Admitting: Oncology

## 2015-09-24 ENCOUNTER — Ambulatory Visit (HOSPITAL_BASED_OUTPATIENT_CLINIC_OR_DEPARTMENT_OTHER): Payer: PRIVATE HEALTH INSURANCE | Admitting: Nurse Practitioner

## 2015-09-24 VITALS — BP 140/82 | HR 68 | Temp 98.6°F | Resp 20 | Ht 71.0 in | Wt 230.9 lb

## 2015-09-24 DIAGNOSIS — C187 Malignant neoplasm of sigmoid colon: Secondary | ICD-10-CM

## 2015-09-24 DIAGNOSIS — G893 Neoplasm related pain (acute) (chronic): Secondary | ICD-10-CM | POA: Diagnosis not present

## 2015-09-24 DIAGNOSIS — C786 Secondary malignant neoplasm of retroperitoneum and peritoneum: Secondary | ICD-10-CM | POA: Diagnosis not present

## 2015-09-24 NOTE — Progress Notes (Addendum)
[place] OFFICE PROGRESS NOTE   Diagnosis:  Colon cancer  INTERVAL HISTORY:   Marc King returns as scheduled. He feels he is recovering well from the recent surgery. He does not have significant pain. He has some "soreness". He occasionally takes a pain pill. He has no pain at present. He took a dulcolax yesterday and had a bowel movement. No nausea or vomiting. He has a good appetite. He has an indwelling Foley catheter. He thinks this will likely be removed today.  Objective:  Vital signs in last 24 hours:  Blood pressure 140/82, pulse 68, temperature 98.6 F (37 C), temperature source Oral, resp. rate 20, height '5\' 11"'$  (1.803 m), weight 230 lb 14.4 oz (104.736 kg), SpO2 100 %.    HEENT: No thrush or ulcers. Resp: Lungs clear bilaterally. Cardio: Regular rate and rhythm. GI: Abdomen is soft and nontender. Healing surgical incisions. No hepatomegaly. Vascular: No leg edema.   Lab Results:  Lab Results  Component Value Date   WBC 9.4 09/16/2015   HGB 11.9* 09/16/2015   HCT 36.5* 09/16/2015   MCV 84.7 09/16/2015   PLT 327 09/16/2015   NEUTROABS 9.6* 12/16/2014    Imaging:  No results found.  Medications: I have reviewed the patient's current medications.  Assessment/Plan: 1. Adenocarcinoma the sigmoid colon, status post a colonoscopic biopsy 08/07/2015  CT abdomen/pelvis 07/05/2015 with masslike thickening of the sigmoid colon, adjacent lymphadenopathy, and prominent para-aortic and porta hepatis nodes  CT chest 08/28/2015 with bilateral pulmonary nodules suspicious for metastases  PET scan 09/05/2015 with sigmoid colon mass SUV max 18.4; numerous lymph nodes in the sigmoid mesocolon most of which are too small for PET resolution; 8 mm right external iliac chain lymph node SUV max 5.2; hypermetabolic lymph nodes tracking superiorly along the retroperitoneum extending to the aortocaval station at the level of the renal arteries; index lymph node at  the level of the aortic bifurcation measures 12 mm with an SUV max of 4.5; hepato duodenal lymph node measures 10 mm with SUV max 3.6; no abnormal hypermetabolism in the liver, adrenal glands, spleen or pancreas. A few scattered pulmonary nodules which do not show abnormal hypermetabolism, too small for PET resolution.  09/13/2015 status post laparoscopic sigmoidectomy and cystoscopy with bilateral stent placement  Preserved expression of the major and minor MMR proteins.  7 cm adenocarcinoma of the sigmoid colon, grade 2; tumor invades through the colonic wall and adherent to the pelvic sidewall and anterior peritoneum; margin of resection positive at the tumor soft tissue resection margin; 28 benign lymph nodes  2. Right lower abdominal pain secondary to #1   Disposition: Marc King appears stable. He is recovering from the recent surgery. Dr. Benay Spice reviewed the PET scan and pathology results with Marc King and his wife. His case will be presented at the GI conference this week. He will return for a follow-up visit 09/28/2015 to discuss specific treatment recommendations. He understands this will likely include chemotherapy and radiation. We began a preliminary discussion regarding FOLFOX chemotherapy.  Patient seen with Dr. Benay Spice. 25 minutes were spent face-to-face at today's visit with the majority of that time involved in counseling/coordination of care.    Ned Card ANP/GNP-BC   09/24/2015  11:05 AM  This was a shared visit with Ned Card. We reviewed the surgical pathology report and PET results with Marc King and his wife. 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  Surgical margins were positive from the sigmoid colon resection.  His case will be presented at the GI tumor conference on 09/26/2015. He will return for an office visit and further discussion on 09/28/2015. We will decide  on a course of adjuvant therapy based on further discussion with Dr. Marcello Moores and radiation oncology. My initial impression is to recommend a course of adjuvant FOLFOX and concurrent 5-FU/radiation.  Julieanne Manson, M.D.

## 2015-09-24 NOTE — Telephone Encounter (Signed)
Gave and printed appt shced and avs fo rpt for jUne and July....emailed Rashida to add pt on 6.30 @ 11 per pof due to MD sched blocked..the patient aware of all appts

## 2015-09-26 ENCOUNTER — Other Ambulatory Visit: Payer: Self-pay | Admitting: *Deleted

## 2015-09-26 ENCOUNTER — Other Ambulatory Visit: Payer: Self-pay | Admitting: Nurse Practitioner

## 2015-09-26 ENCOUNTER — Telehealth: Payer: Self-pay | Admitting: Nurse Practitioner

## 2015-09-26 DIAGNOSIS — C187 Malignant neoplasm of sigmoid colon: Secondary | ICD-10-CM

## 2015-09-26 NOTE — Telephone Encounter (Signed)
I notified Marc King that Dr. Benay Spice has recommended a referral to interventional radiology for biopsy of a retroperitoneal lymph node. He is in agreement. Orders have been entered and POF submitted.

## 2015-09-27 ENCOUNTER — Telehealth: Payer: Self-pay | Admitting: Oncology

## 2015-09-27 ENCOUNTER — Other Ambulatory Visit: Payer: Self-pay | Admitting: *Deleted

## 2015-09-27 NOTE — Telephone Encounter (Signed)
Per pof cx 6.30 appt....done...the patient aware

## 2015-09-28 ENCOUNTER — Telehealth: Payer: Self-pay | Admitting: Oncology

## 2015-09-28 ENCOUNTER — Ambulatory Visit: Payer: PRIVATE HEALTH INSURANCE | Admitting: Oncology

## 2015-09-28 NOTE — Telephone Encounter (Signed)
lvm for pt regarding to 7.7 appt °

## 2015-09-28 NOTE — Telephone Encounter (Signed)
Faxed pt office notes to [facility] [phone]

## 2015-10-01 ENCOUNTER — Other Ambulatory Visit: Payer: Self-pay | Admitting: General Surgery

## 2015-10-01 ENCOUNTER — Other Ambulatory Visit: Payer: Self-pay | Admitting: Radiology

## 2015-10-01 DIAGNOSIS — C187 Malignant neoplasm of sigmoid colon: Secondary | ICD-10-CM

## 2015-10-01 MED ORDER — DEXTROSE 5 % IV SOLN: 2.0000 g | INTRAVENOUS | Status: AC

## 2015-10-03 ENCOUNTER — Ambulatory Visit (HOSPITAL_COMMUNITY)
Admission: RE | Admit: 2015-10-03 | Discharge: 2015-10-03 | Disposition: A | Discharge status: Home / Self Care | Payer: PRIVATE HEALTH INSURANCE | Source: Ambulatory Visit | Attending: General Surgery | Admitting: General Surgery

## 2015-10-03 ENCOUNTER — Other Ambulatory Visit: Payer: Self-pay | Admitting: General Surgery

## 2015-10-03 ENCOUNTER — Encounter (HOSPITAL_COMMUNITY): Payer: Self-pay

## 2015-10-03 ENCOUNTER — Ambulatory Visit (HOSPITAL_COMMUNITY)
Admission: RE | Admit: 2015-10-03 | Discharge: 2015-10-03 | Disposition: A | Discharge status: Home / Self Care | Payer: PRIVATE HEALTH INSURANCE | Source: Ambulatory Visit | Attending: Nurse Practitioner | Admitting: Nurse Practitioner

## 2015-10-03 ENCOUNTER — Ambulatory Visit (HOSPITAL_COMMUNITY): Payer: PRIVATE HEALTH INSURANCE

## 2015-10-03 DIAGNOSIS — R599 Enlarged lymph nodes, unspecified: Secondary | ICD-10-CM | POA: Insufficient documentation

## 2015-10-03 DIAGNOSIS — Z95828 Presence of other vascular implants and grafts: Secondary | ICD-10-CM | POA: Insufficient documentation

## 2015-10-03 DIAGNOSIS — C187 Malignant neoplasm of sigmoid colon: Secondary | ICD-10-CM

## 2015-10-03 DIAGNOSIS — N200 Calculus of kidney: Secondary | ICD-10-CM | POA: Insufficient documentation

## 2015-10-03 DIAGNOSIS — R918 Other nonspecific abnormal finding of lung field: Secondary | ICD-10-CM | POA: Insufficient documentation

## 2015-10-03 DIAGNOSIS — R590 Localized enlarged lymph nodes: Secondary | ICD-10-CM | POA: Diagnosis present

## 2015-10-03 DIAGNOSIS — Z87891 Personal history of nicotine dependence: Secondary | ICD-10-CM | POA: Diagnosis not present

## 2015-10-03 LAB — BASIC METABOLIC PANEL
ANION GAP: 7 (ref 5–15)
BUN: 9 mg/dL (ref 6–20)
CALCIUM: 9.3 mg/dL (ref 8.9–10.3)
CO2: 25 mmol/L (ref 22–32)
CREATININE: 0.82 mg/dL (ref 0.61–1.24)
Chloride: 105 mmol/L (ref 101–111)
GFR calc Af Amer: >60 mL/min (ref >60)
GFR calc non Af Amer: >60 mL/min (ref >60)
GLUCOSE: 154 mg/dL — AB (ref 65–99)
Potassium: 4 mmol/L (ref 3.5–5.1)
Sodium: 137 mmol/L (ref 135–145)

## 2015-10-03 LAB — PROTIME-INR
INR: 1.16 (ref 0.00–1.49)
PROTHROMBIN TIME: 15 s (ref 11.6–15.2)

## 2015-10-03 LAB — CBC
HCT: 37.8 % — ABNORMAL LOW (ref 39.0–52.0)
Hemoglobin: 12.3 g/dL — ABNORMAL LOW (ref 13.0–17.0)
MCH: 27.1 pg (ref 26.0–34.0)
MCHC: 32.5 g/dL (ref 30.0–36.0)
MCV: 83.3 fL (ref 78.0–100.0)
PLATELETS: 344 10*3/uL (ref 150–400)
RBC: 4.54 MIL/uL (ref 4.22–5.81)
RDW: 13.1 % (ref 11.5–15.5)
WBC: 6.1 10*3/uL (ref 4.0–10.5)

## 2015-10-03 LAB — APTT: APTT: 29 s (ref 24–37)

## 2015-10-03 MED ORDER — MIDAZOLAM HCL 2 MG/2ML IJ SOLN
INTRAMUSCULAR | Status: AC | PRN
  Administered 2015-10-03: 1 mg via INTRAVENOUS

## 2015-10-03 MED ORDER — SODIUM CHLORIDE 0.9 % IV SOLN
INTRAVENOUS | Status: DC
Start: 2015-10-03 — End: 2015-10-04
  Administered 2015-10-03: 10:00:00 via INTRAVENOUS

## 2015-10-03 MED ORDER — FENTANYL CITRATE (PF) 100 MCG/2ML IJ SOLN
INTRAMUSCULAR | Status: AC
  Filled 2015-10-03: qty 4

## 2015-10-03 MED ORDER — LIDOCAINE HCL 1 % IJ SOLN
INTRAMUSCULAR | Status: AC
  Filled 2015-10-03: qty 20

## 2015-10-03 MED ORDER — FENTANYL CITRATE (PF) 100 MCG/2ML IJ SOLN
INTRAMUSCULAR | Status: AC | PRN
  Administered 2015-10-03 (×2): 50 ug via INTRAVENOUS

## 2015-10-03 MED ORDER — HEPARIN SOD (PORK) LOCK FLUSH 100 UNIT/ML IV SOLN
INTRAVENOUS | Status: AC
  Administered 2015-10-03: 500 [IU]
  Filled 2015-10-03: qty 5

## 2015-10-03 MED ORDER — CEFAZOLIN SODIUM-DEXTROSE 2-4 GM/100ML-% IV SOLN
INTRAVENOUS | Status: DC
Start: 2015-10-03 — End: 2015-10-04
  Filled 2015-10-03: qty 100

## 2015-10-03 MED ORDER — FENTANYL CITRATE (PF) 100 MCG/2ML IJ SOLN
INTRAMUSCULAR | Status: AC | PRN
  Administered 2015-10-03: 25 ug via INTRAVENOUS

## 2015-10-03 MED ORDER — MIDAZOLAM HCL 2 MG/2ML IJ SOLN
INTRAMUSCULAR | Status: AC | PRN
  Administered 2015-10-03 (×2): 1 mg via INTRAVENOUS

## 2015-10-03 MED ORDER — LIDOCAINE HCL 1 % IJ SOLN
INTRAMUSCULAR | Status: AC
  Administered 2015-10-03: 20 mL
  Filled 2015-10-03: qty 20

## 2015-10-03 MED ORDER — CEFAZOLIN SODIUM-DEXTROSE 2-4 GM/100ML-% IV SOLN
2.0000 g | INTRAVENOUS | Status: AC
  Administered 2015-10-03: 2 g via INTRAVENOUS
  Filled 2015-10-03: qty 100

## 2015-10-03 MED ORDER — MIDAZOLAM HCL 2 MG/2ML IJ SOLN
INTRAMUSCULAR | Status: AC
Start: 2015-10-03 — End: 2015-10-03
  Filled 2015-10-03: qty 4

## 2015-10-03 NOTE — Sedation Documentation (Signed)
Pt tolerated procedure well, will continue to monitor pt during transport to IR for port a cath placement. Vitals stable, pt easily arousable

## 2015-10-03 NOTE — Sedation Documentation (Signed)
Patient is resting comfortably. 

## 2015-10-03 NOTE — Procedures (Signed)
Interventional Radiology Procedure Note  Procedure: Placement of a right IJ approach single lumen PowerPort.  Tip is positioned at the superior cavoatrial junction and catheter is ready for immediate use.  Complications: No immediate Recommendations:  - Ok to shower tomorrow - Do not submerge for 7 days - Routine line care   Signed,  April Colter S. Jerran Tappan, DO    

## 2015-10-03 NOTE — Discharge Instructions (Signed)
Needle Biopsy, Care After These instructions give you information about caring for yourself after your procedure. Your doctor may also give you more specific instructions. Call your doctor if you have any problems or questions after your procedure. HOME CARE  Rest as told by your doctor.  Take medicines only as told by your doctor.  There are many different ways to close and cover the biopsy site, including stitches (sutures), skin glue, and adhesive strips. Follow instructions from your doctor about:  How to take care of your biopsy site.  When and how you should change your bandage (dressing).  When you should remove your dressing.  Removing whatever was used to close your biopsy site.  Check your biopsy site every day for signs of infection. Watch for:  Redness, swelling, or pain.  Fluid, blood, or pus. GET HELP IF:  You have a fever.  You have redness, swelling, or pain at the biopsy site, and it lasts longer than a few days.  You have fluid, blood, or pus coming from the biopsy site.  You feel sick to your stomach (nauseous).  You throw up (vomit). GET HELP RIGHT AWAY IF:  You are short of breath.  You have trouble breathing.  Your chest hurts.  You feel dizzy or you pass out (faint).  You have bleeding that does not stop with pressure or a bandage.  You cough up blood.  Your belly (abdomen) hurts.   This information is not intended to replace advice given to you by your health care provider. Make sure you discuss any questions you have with your health care provider.   Document Released: 02/28/2008 Document Revised: 08/01/2014 Document Reviewed: 03/13/2014 Elsevier Interactive Patient Education 2016 [place] instructions given to pt and wife verbally and in writing.  Booklet given to pt

## 2015-10-03 NOTE — Sedation Documentation (Signed)
Vital signs stable. 

## 2015-10-03 NOTE — Procedures (Signed)
Interventional Radiology Procedure Note  Procedure: CT guided biopsy of left retroperitoneal lymph node, just adjacent to the left renal vein, which was FDG avid on prior PET-CT.  Multiple 18g core biopsies.    Complications: None Recommendations:  - Ok to shower tomorrow - Do not submerge for 7 days - Routine care - Proceed with Port placement today.    Signed,  Dulcy Fanny. Earleen Newport, DO

## 2015-10-03 NOTE — H&P (Signed)
Chief Complaint: Patient was seen in consultation today for left retroperitoneal lymph node biopsy and Port a Cath placement at the request of Dr Julieanne Manson  Referring Physician(s): Dr Julieanne Manson  Supervising Physician: Corrie Mckusick  Patient Status: Outpatient  History of Present Illness: Marc King is a 40 y.o. male   Hx Colon Ca Colon surgery 09/13/2015 PET 6/7: IMPRESSION: 1. Sigmoid colon carcinoma with local/regional adenopathy as well as hypermetabolic adenopathy in the abdominal retroperitoneum and possibly hepatoduodenal ligament. 2. Pulmonary nodules are too small for PET resolution. While metastatic disease cannot be excluded, it is considered less likely in the absence of hepatic metastatic disease. 3. Punctate renal stones.  To begin chemotherapy in 1 week Scheduled for biopsy of Left retroperitoneal LN to determine treatment plan  Past Medical History  Diagnosis Date  . Scarlet fever   . Cancer of sigmoid colon ([place]) 08/07/2015    Past Surgical History  Procedure Laterality Date  . Vasectomy    . Laparoscopic partial colectomy N/A 09/13/2015    Procedure: LAPAROSCOPIC SIGMOIDECTOMY;  Surgeon: Leighton Ruff, MD;  Location: [facility];  Service: General;  Laterality: N/A;  . Proctoscopy  09/13/2015    Procedure: PROCTOSCOPY;  Surgeon: Leighton Ruff, MD;  Location: [facility];  Service: General;;  . Cystoscopy with stent placement Bilateral 09/13/2015    Procedure: CYSTOSCOPY WITH BILATERAL STENT PLACEMENT;  Surgeon: Festus Aloe, MD;  Location: [facility];  Service: Urology;  Laterality: Bilateral;    Allergies: Review of patient's allergies indicates no known allergies.  Medications: Prior to Admission medications   Medication Sig Start Date End Date Taking? Authorizing Provider  acetaminophen (TYLENOL) 500 MG tablet Take 1,000 mg by mouth every 6 (six) hours as needed (For pain.).    Yes Historical Provider, MD  ibuprofen (ADVIL,MOTRIN) 200 MG  tablet Take 200-400 mg by mouth every 6 (six) hours as needed (For pain.).   Yes Historical Provider, MD  oxyCODONE (OXY IR/ROXICODONE) 5 MG immediate release tablet Take 1-2 tablets (5-10 mg total) by mouth every 4 (four) hours as needed for moderate pain. 09/17/15  Yes Greer Pickerel, MD     Family History  Problem Relation Age of Onset  . Melanoma Father   . Leukemia Mother   . Diabetes Mother   . Colitis Sister   . Irritable bowel syndrome Sister   . Celiac disease Sister   . Clotting disorder Father   . Heart failure Maternal Grandmother     during surgery  . Anuerysm Paternal Grandmother     brain  . Anuerysm Maternal Grandfather     stomach    Social History   Social History  . Marital Status: Married    Spouse Name: Marc King  . Number of Children: 2  . Years of Education: N/A   Occupational History  . sales    Social History Main Topics  . Smoking status: Former Smoker    Types: Cigarettes    Quit date: 03/31/2004  . Smokeless tobacco: Never Used  . Alcohol Use: No     Comment: Quit in 2006  . Drug Use: No     Comment: hx of 2005 marijuana use none in 11 years   . Sexual Activity: Yes   Other Topics Concern  . None   Social History Narrative   Married, wife Marc King   #2 children-ages 8/10 (homeschool)   Works in Press photographer for [employer]: A 12 point ROS discussed  and pertinent positives are indicated in the HPI above.  All other systems are negative.  Review of Systems  Constitutional: Negative for fever, activity change and fatigue.  Respiratory: Negative for shortness of breath.   Cardiovascular: Negative for chest pain.  Gastrointestinal: Negative for rectal pain.  Musculoskeletal: Negative for back pain.  Neurological: Negative for weakness.  Psychiatric/Behavioral: Negative for behavioral problems, confusion and decreased concentration.    Vital Signs: BP 151/73 mmHg  Pulse 55  Temp(Src) 98.2 F (36.8 C) (Oral)  Resp 18   Ht 5\' 11"  (1.803 m)  Wt 231 lb (104.781 kg)  BMI 32.23 kg/m2  SpO2 100%  Physical Exam  Constitutional: He is oriented to person, place, and time. He appears well-nourished.  Cardiovascular: Normal rate, regular rhythm and normal heart sounds.   Pulmonary/Chest: Effort normal and breath sounds normal. He has no wheezes.  Abdominal: Soft. Bowel sounds are normal. There is no tenderness.  Musculoskeletal: Normal range of motion.  Neurological: He is alert and oriented to person, place, and time.  Skin: Skin is warm and dry.  Psychiatric: He has a normal mood and affect. His behavior is normal. Judgment and thought content normal.  Nursing note and vitals reviewed.   Mallampati Score:  MD Evaluation Airway: WNL Heart: WNL Abdomen: WNL Chest/ Lungs: WNL ASA  Classification: 3 Mallampati/Airway Score: One  Imaging: Nm Pet Image Initial (pi) Skull Base To Thigh  09/05/2015  CLINICAL DATA:  Initial treatment strategy for colon cancer. EXAM: NUCLEAR MEDICINE PET SKULL BASE TO THIGH TECHNIQUE: 11.7 mCi F-18 FDG was injected intravenously. Full-ring PET imaging was performed from the skull base to thigh after the radiotracer. CT data was obtained and used for attenuation correction and anatomic localization. FASTING BLOOD GLUCOSE:  Value: 130 mg/dl COMPARISON:  CT chest 08/28/2015 and CT abdomen pelvis 07/05/2015. FINDINGS: NECK No hypermetabolic lymph nodes the neck. CT images show no acute findings. CHEST No hypermetabolic mediastinal, hilar or axillary lymph nodes. There are a few scattered pulmonary nodules which do not show abnormal hypermetabolism. These are too small for PET resolution, however. No pericardial or pleural effusion. ABDOMEN/PELVIS Sigmoid colon mass measures approximately 4.3 x 8.6 cm and has an SUV max of 18.4. There are numerous lymph nodes in the sigmoid mesocolon, most of which are too small for PET resolution. An 8 mm right external iliac chain lymph node (CT image 180)  has an SUV max of 5.2. Hypermetabolic lymph nodes track superiorly along the retroperitoneum, extending to the aortocaval station at the level of the renal arteries (CT image 133). Index lymph node at the level of the aortic bifurcation measures 12 mm (CT image 162) with an SUV max of 4.5. Hepatoduodenal lymph node measures 10 mm (CT image 114) with an SUV max of 3.6. No abnormal hypermetabolism in the liver, adrenal glands, spleen or pancreas. CT images show the liver, gallbladder, adrenal glands to be grossly unremarkable. Sub cm low-attenuation lesion off the right kidney is too small to characterize. Punctate renal stones bilaterally. Spleen, pancreas, stomach and bowel are otherwise unremarkable. SKELETON No abnormal osseous hypermetabolism. IMPRESSION: 1. Sigmoid colon carcinoma with local/regional adenopathy as well as hypermetabolic adenopathy in the abdominal retroperitoneum and possibly hepatoduodenal ligament. 2. Pulmonary nodules are too small for PET resolution. While metastatic disease cannot be excluded, it is considered less likely in the absence of hepatic metastatic disease. 3. Punctate renal stones. Electronically Signed   By: Lorin Picket M.D.   On: 09/05/2015 09:14   Dg C-arm  1-60 Min-no Report  09/13/2015  CLINICAL DATA: surger C-ARM 1-60 MINUTES Fluoroscopy was utilized by the requesting physician.  No radiographic interpretation.    Labs:  CBC:  Recent Labs  09/14/15 0412 09/15/15 0424 09/16/15 0358 10/03/15 1015  WBC 13.1* 11.8* 9.4 6.1  HGB 12.6* 11.9* 11.9* 12.3*  HCT 37.5* 37.2* 36.5* 37.8*  PLT 359 339 327 344    COAGS: No results for input(s): INR, APTT in the last 8760 hours.  BMP:  Recent Labs  09/06/15 0940 09/14/15 0412 09/15/15 0424 09/16/15 0358  NA 136 137 138 137  K 4.1 3.9 3.9 4.1  CL 106 106 105 103  CO2 23 27 28 29   GLUCOSE 148* 194* 138* 148*  BUN 10 8 6 8   CALCIUM 8.9 8.5* 8.5* 8.7*  CREATININE 0.73 0.87 0.80 0.79  GFRNONAA >60  >60 >60 >60  GFRAA >60 >60 >60 >60    LIVER FUNCTION TESTS:  Recent Labs  12/16/14 1142 07/05/15 1559  BILITOT 1.3* 0.9  AST 25 18  ALT 17 16*  ALKPHOS 46 42  PROT 7.1 7.2  ALBUMIN 3.7 3.4*    TUMOR MARKERS: No results for input(s): AFPTM, CEA, CA199, CHROMGRNA in the last 8760 hours.  Assessment and Plan:  Colon Ca Surgical removal 09/13/15 +PET for LAN Now scheduled for L RP LAN bx To begin chemotherapy next week Risks and Benefits discussed with the patient including, but not limited to bleeding, infection, damage to adjacent structures or low yield requiring additional tests. All of the patient's questions were answered, patient is agreeable to proceed. Consent signed and in chart. Risks and Benefits discussed with the patient including, but not limited to bleeding, infection, pneumothorax, or fibrin sheath development and need for additional procedures. All of the patient's questions were answered, patient is agreeable to proceed. Consent signed and in chart.   Thank you for this interesting consult.  I greatly enjoyed meeting Marc King and look forward to participating in their care.  A copy of this report was sent to the requesting provider on this date.  Electronically Signed: Nasir Bright A 10/03/2015, 10:52 AM   I spent a total of  30 Minutes   in face to face in clinical consultation, greater than 50% of which was counseling/coordinating care for L retroperitoneal LN bx and PAC

## 2015-10-03 NOTE — Sedation Documentation (Signed)
Patient is resting comfortably. No complaints at this time, vital signs stable. 

## 2015-10-03 NOTE — Sedation Documentation (Signed)
Pt transported to IR. Vitals stable.

## 2015-10-04 ENCOUNTER — Ambulatory Visit: Payer: PRIVATE HEALTH INSURANCE | Admitting: Oncology

## 2015-10-05 ENCOUNTER — Other Ambulatory Visit: Payer: PRIVATE HEALTH INSURANCE

## 2015-10-05 ENCOUNTER — Telehealth: Payer: Self-pay | Admitting: Oncology

## 2015-10-05 ENCOUNTER — Ambulatory Visit (HOSPITAL_BASED_OUTPATIENT_CLINIC_OR_DEPARTMENT_OTHER): Payer: PRIVATE HEALTH INSURANCE | Admitting: Oncology

## 2015-10-05 ENCOUNTER — Telehealth: Payer: Self-pay | Admitting: *Deleted

## 2015-10-05 VITALS — BP 135/88 | HR 66 | Temp 98.4°F | Resp 20 | Ht 71.0 in | Wt 235.2 lb

## 2015-10-05 DIAGNOSIS — C187 Malignant neoplasm of sigmoid colon: Secondary | ICD-10-CM

## 2015-10-05 DIAGNOSIS — G893 Neoplasm related pain (acute) (chronic): Secondary | ICD-10-CM

## 2015-10-05 MED ORDER — PROCHLORPERAZINE MALEATE 10 MG PO TABS
10.0000 mg | ORAL_TABLET | Freq: Four times a day (QID) | ORAL | Status: DC | PRN
Start: 2015-10-05 — End: 2016-02-28

## 2015-10-05 MED ORDER — LIDOCAINE-PRILOCAINE 2.5-2.5 % EX CREA: TOPICAL_CREAM | CUTANEOUS | Status: DC

## 2015-10-05 NOTE — Telephone Encounter (Signed)
per pof to sch pt appt-sent MW email to [place] trmt-will callk pt after reply

## 2015-10-05 NOTE — Telephone Encounter (Signed)
per pof to sch pt appt-gave pt copy of avs °

## 2015-10-05 NOTE — Progress Notes (Signed)
[place] OFFICE PROGRESS NOTE   Diagnosis: Colon cancer  INTERVAL HISTORY:   Marc King returns as scheduled. He underwent placement of a Port-A-Cath and biopsy of a retroperitoneal lymph node on 10/03/2015. He reports tolerating the procedures well. He has soreness at the Port-A-Cath site. No difficulty with urination following removal of the Foley catheter.  The pathology from the retroperitoneal lymph node biopsy revealed benign lymph node tissue with no evidence of malignancy.  Objective:  Vital signs in last 24 hours:  Blood pressure 135/88, pulse 66, temperature 98.4 F (36.9 C), temperature source Oral, resp. rate 20, height '5\' 11"'$  (1.803 m), weight 235 lb 3.2 oz (106.686 kg), SpO2 100 %.    Resp: Lungs clear bilaterally Cardio: Regular rate and rhythm GI: No hepatomegaly, nontender, healed surgical incisions Vascular: No leg edema   Portacath/PICC-without erythema  Lab Results:  Lab Results  Component Value Date   WBC 6.1 10/03/2015   HGB 12.3* 10/03/2015   HCT 37.8* 10/03/2015   MCV 83.3 10/03/2015   PLT 344 10/03/2015   NEUTROABS 9.6* 12/16/2014    Lab Results  Component Value Date   NA 137 10/03/2015    Lab Results  Component Value Date   CEA1 6.6* 08/31/2015    Imaging:  Ir Fluoro Guide Cv Line Right  10/03/2015  INDICATION: 40 year old male with a history of colon carcinoma. Patient presents today for port catheter. EXAM: IMPLANTED PORT A CATH PLACEMENT WITH ULTRASOUND AND FLUOROSCOPIC GUIDANCE MEDICATIONS: 2.0 g Ancef; The antibiotic was administered within an appropriate time interval prior to skin puncture. ANESTHESIA/SEDATION: Moderate (conscious) sedation was employed during this procedure. A total of Versed 1.0 mg and Fentanyl 25 mcg was administered intravenously. Moderate Sedation Time: 12 minutes. The patient's level of consciousness and vital signs were monitored continuously by radiology nursing throughout the procedure  under my direct supervision. FLUOROSCOPY TIME:  Zero minutes, 48 seconds (8 mGy) COMPLICATIONS: None PROCEDURE: The procedure, risks, benefits, and alternatives were explained to the patient. Questions regarding the procedure were encouraged and answered. The patient understands and consents to the procedure. Ultrasound survey was performed with images stored and sent to PACs. The right neck and chest was prepped with chlorhexidine, and draped in the usual sterile fashion using maximum barrier technique (cap and mask, sterile gown, sterile gloves, large sterile sheet, hand hygiene and cutaneous antiseptic). Antibiotic prophylaxis was provided with 2.0g Ancef administered IV one hour prior to skin incision. Local anesthesia was attained by infiltration with 1% lidocaine without epinephrine. Ultrasound demonstrated patency of the right internal jugular vein, and this was documented with an image. Under real-time ultrasound guidance, this vein was accessed with a 21 gauge micropuncture needle and image documentation was performed. A small dermatotomy was made at the access site with an 11 scalpel. A 0.018" wire was advanced into the SVC and used to estimate the length of the internal catheter. The access needle exchanged for a 7F micropuncture vascular sheath. The 0.018" wire was then removed and a 0.035" wire advanced into the IVC. An appropriate location for the subcutaneous reservoir was selected below the clavicle and an incision was made through the skin and underlying soft tissues. The subcutaneous tissues were then dissected using a combination of blunt and sharp surgical technique and a pocket was formed. A single lumen power injectable portacatheter was then tunneled through the subcutaneous tissues from the pocket to the dermatotomy and the port reservoir placed within the subcutaneous pocket. The venous access site was then  serially dilated and a peel away vascular sheath placed over the wire. The wire was  removed and the port catheter advanced into position under fluoroscopic guidance. The catheter tip is positioned in the cavoatrial junction. This was documented with a spot image. The portacatheter was then tested and found to flush and aspirate well. The port was flushed with saline followed by 100 units/mL heparinized saline. The pocket was then closed in two layers using first subdermal inverted interrupted absorbable sutures followed by a running subcuticular suture. The epidermis was then sealed with Dermabond. The dermatotomy at the venous access site was also seal with Dermabond. Patient tolerated the procedure well and remained hemodynamically stable throughout. No complications encountered and no significant blood loss encountered IMPRESSION: Status post right IJ port catheter placement. Catheter ready for use. Signed, Dulcy Fanny. Earleen Newport, DO [facility] Radiology Electronically Signed   By: Corrie Mckusick D.O.   On: 10/03/2015 13:32   Ir US Guide Vasc Access Right  10/03/2015  INDICATION: 40 year old male with a history of colon carcinoma. Patient presents today for port catheter. EXAM: IMPLANTED PORT A CATH PLACEMENT WITH ULTRASOUND AND FLUOROSCOPIC GUIDANCE MEDICATIONS: 2.0 g Ancef; The antibiotic was administered within an appropriate time interval prior to skin puncture. ANESTHESIA/SEDATION: Moderate (conscious) sedation was employed during this procedure. A total of Versed 1.0 mg and Fentanyl 25 mcg was administered intravenously. Moderate Sedation Time: 12 minutes. The patient's level of consciousness and vital signs were monitored continuously by radiology nursing throughout the procedure under my direct supervision. FLUOROSCOPY TIME:  Zero minutes, 48 seconds (8 mGy) COMPLICATIONS: None PROCEDURE: The procedure, risks, benefits, and alternatives were explained to the patient. Questions regarding the procedure were encouraged and answered. The patient  understands and consents to the procedure. Ultrasound survey was performed with images stored and sent to PACs. The right neck and chest was prepped with chlorhexidine, and draped in the usual sterile fashion using maximum barrier technique (cap and mask, sterile gown, sterile gloves, large sterile sheet, hand hygiene and cutaneous antiseptic). Antibiotic prophylaxis was provided with 2.0g Ancef administered IV one hour prior to skin incision. Local anesthesia was attained by infiltration with 1% lidocaine without epinephrine. Ultrasound demonstrated patency of the right internal jugular vein, and this was documented with an image. Under real-time ultrasound guidance, this vein was accessed with a 21 gauge micropuncture needle and image documentation was performed. A small dermatotomy was made at the access site with an 11 scalpel. A 0.018" wire was advanced into the SVC and used to estimate the length of the internal catheter. The access needle exchanged for a 67F micropuncture vascular sheath. The 0.018" wire was then removed and a 0.035" wire advanced into the IVC. An appropriate location for the subcutaneous reservoir was selected below the clavicle and an incision was made through the skin and underlying soft tissues. The subcutaneous tissues were then dissected using a combination of blunt and sharp surgical technique and a pocket was formed. A single lumen power injectable portacatheter was then tunneled through the subcutaneous tissues from the pocket to the dermatotomy and the port reservoir placed within the subcutaneous pocket. The venous access site was then serially dilated and a peel away vascular sheath placed over the wire. The wire was removed and the port catheter advanced into position under fluoroscopic guidance. The catheter tip is positioned in the cavoatrial junction. This was documented with a spot image. The portacatheter was then tested and found to flush and aspirate well.  The port was  flushed with saline followed by 100 units/mL heparinized saline. The pocket was then closed in two layers using first subdermal inverted interrupted absorbable sutures followed by a running subcuticular suture. The epidermis was then sealed with Dermabond. The dermatotomy at the venous access site was also seal with Dermabond. Patient tolerated the procedure well and remained hemodynamically stable throughout. No complications encountered and no significant blood loss encountered IMPRESSION: Status post right IJ port catheter placement. Catheter ready for use. Signed, Dulcy Fanny. Earleen Newport, DO [facility] Radiology Electronically Signed   By: Corrie Mckusick D.O.   On: 10/03/2015 13:32   Ct Biopsy  10/03/2015  INDICATION: 40 year old male with a history of colon carcinoma. FDG avid retroperitoneal adenopathy was evident on PET-CT performed 09/05/2015 EXAM: CT BIOPSY MEDICATIONS: None. ANESTHESIA/SEDATION: Moderate (conscious) sedation was employed during this procedure. A total of Versed 2.0 mg and Fentanyl 100 mcg was administered intravenously Moderate Sedation Time: 17 minutes. The patient's level of consciousness and vital signs were monitored continuously by radiology nursing throughout the procedure under my direct supervision. FLUOROSCOPY TIME:  CT COMPLICATIONS: None PROCEDURE: Informed written consent was obtained from the patient after a thorough discussion of the procedural risks, benefits and alternatives. All questions were addressed. Maximal Sterile Barrier Technique was utilized including caps, mask, sterile gowns, sterile gloves, sterile drape, hand hygiene and skin antiseptic. A timeout was performed prior to the initiation of the procedure. Patient positioned prone position on the CT gantry table. Scout CT of the abdomen was performed for planning purposes. Once we determine the lymph node for biopsy within the left retroperitoneum, the patient was prepped  and draped in the usual sterile fashion. The skin and subcutaneous tissues were generously infiltrated 1% lidocaine for local anesthesia. A small stab incision was made with 11 blade scalpel. Using CT guidance, a 15 cm 17 gauge trocar needle was advanced into the targeted lymph node. A coaxial Chiba needle through the 17 gauge guide needle confirmed biopsy trajectory. Once we confirmed position, multiple 18 gauge core biopsy were retrieved and placed into formalin solution. Needle was removed. Patient tolerated the procedure well and remained hemodynamically stable throughout. No complications were encountered and no significant blood loss encountered. IMPRESSION: Status post CT-guided biopsy of left retroperitoneal lymph node which was FDG positive on prior CT. Tissue specimen sent to pathology for complete histopathologic analysis. Signed, Dulcy Fanny. Earleen Newport, DO [facility] Radiology Electronically Signed   By: Corrie Mckusick D.O.   On: 10/03/2015 13:30    Medications: I have reviewed the patient's current medications.  Assessment/Plan: 1. Adenocarcinoma the sigmoid colon, status post a colonoscopic biopsy 08/07/2015  CT abdomen/pelvis 07/05/2015 with masslike thickening of the sigmoid colon, adjacent lymphadenopathy, and prominent para-aortic and porta hepatis nodes  CT chest 08/28/2015 with bilateral pulmonary nodules suspicious for metastases  PET scan 09/05/2015 with sigmoid colon mass SUV max 18.4; numerous lymph nodes in the sigmoid mesocolon most of which are too small for PET resolution; 8 mm right external iliac chain lymph node SUV max 5.2; hypermetabolic lymph nodes tracking superiorly along the retroperitoneum extending to the aortocaval station at the level of the renal arteries; index lymph node at the level of the aortic bifurcation measures 12 mm with an SUV max of 4.5; hepato duodenal lymph node measures 10 mm with SUV max 3.6; no abnormal  hypermetabolism in the liver, adrenal glands, spleen or pancreas. A few scattered pulmonary nodules which do not  show abnormal hypermetabolism, too small for PET resolution.  09/13/2015 status post laparoscopic sigmoidectomy and cystoscopy with bilateral stent placement  Preserved expression of the major and minor MMR proteins. MSI-stable  7 cm adenocarcinoma of the sigmoid colon, grade 2; tumor invades through the colonic wall and adherent to the pelvic sidewall and anterior peritoneum; 28 benign lymph nodes  CT biopsy of a hypermetabolic left retroperitoneal lymph node on 10/03/2015-pathology negative for malignancy  2. Right lower abdominal pain secondary to #1   Disposition:  Marc King has been diagnosed with locally advanced colon cancer. Biopsy of a retropectoral lymph node is negative for metastatic disease. He understands the possibility of sampling error of on the lymph node biopsy. The possibility remains that he could have metastatic disease.  I recommend proceeding with adjuvant therapy with a goal for cure. I recommend FOLFOX chemotherapy. We will refer him to Dr. Lisbeth Renshaw to consider adjuvant radiation given the close surgical margin. If Dr. Lisbeth Renshaw agrees I will recommend concurrent Xeloda with radiation.  We reviewed the potential toxicities associated with the FOLFOX regimen including the chance for nausea/vomiting, mucositis, diarrhea, alopecia, and hematologic toxicity. We discussed the sun sensitivity, rash, hyperpigmentation, and hand/foot syndrome associated with 5 fluorouracil. We reviewed the allergic reaction and various types of neuropathy seen with oxaliplatin. He agrees to proceed. Marc King will attend a chemotherapy teaching class today.  Marc King is scheduled for a first cycle of adjuvant chemotherapy on 10/11/2015. He will return for an office visit and cycle 2 chemotherapy on 10/25/2015.  Betsy Coder, MD  10/05/2015  8:35 AM

## 2015-10-05 NOTE — Telephone Encounter (Signed)
Per staff message and POF I have scheduled appts. Advised scheduler of appts and first available given on 7/27. JMW

## 2015-10-07 ENCOUNTER — Other Ambulatory Visit: Payer: Self-pay | Admitting: Oncology

## 2015-10-08 ENCOUNTER — Other Ambulatory Visit: Payer: PRIVATE HEALTH INSURANCE

## 2015-10-11 ENCOUNTER — Ambulatory Visit: Payer: PRIVATE HEALTH INSURANCE

## 2015-10-11 ENCOUNTER — Ambulatory Visit (HOSPITAL_BASED_OUTPATIENT_CLINIC_OR_DEPARTMENT_OTHER): Payer: PRIVATE HEALTH INSURANCE

## 2015-10-11 ENCOUNTER — Other Ambulatory Visit (HOSPITAL_BASED_OUTPATIENT_CLINIC_OR_DEPARTMENT_OTHER): Payer: PRIVATE HEALTH INSURANCE

## 2015-10-11 VITALS — BP 136/90 | HR 79 | Temp 98.9°F | Resp 16

## 2015-10-11 DIAGNOSIS — C187 Malignant neoplasm of sigmoid colon: Secondary | ICD-10-CM

## 2015-10-11 DIAGNOSIS — Z5111 Encounter for antineoplastic chemotherapy: Secondary | ICD-10-CM

## 2015-10-11 LAB — COMPREHENSIVE METABOLIC PANEL
ALT: 16 U/L (ref 0–55)
AST: 18 U/L (ref 5–34)
Albumin: 3.6 g/dL (ref 3.5–5.0)
Alkaline Phosphatase: 49 U/L (ref 40–150)
Anion Gap: 11 mEq/L (ref 3–11)
BUN: 11.1 mg/dL (ref 7.0–26.0)
CHLORIDE: 103 meq/L (ref 98–109)
CO2: 23 meq/L (ref 22–29)
Calcium: 9.2 mg/dL (ref 8.4–10.4)
Creatinine: 1 mg/dL (ref 0.7–1.3)
GLUCOSE: 260 mg/dL — AB (ref 70–140)
POTASSIUM: 4.3 meq/L (ref 3.5–5.1)
SODIUM: 137 meq/L (ref 136–145)
Total Bilirubin: 0.42 mg/dL (ref 0.20–1.20)
Total Protein: 7.6 g/dL (ref 6.4–8.3)

## 2015-10-11 LAB — CBC WITH DIFFERENTIAL/PLATELET
BASO%: 0.3 % (ref 0.0–2.0)
BASOS ABS: 0 10*3/uL (ref 0.0–0.1)
EOS ABS: 0.1 10*3/uL (ref 0.0–0.5)
EOS%: 1.6 % (ref 0.0–7.0)
HCT: 38.8 % (ref 38.4–49.9)
HGB: 12.9 g/dL — ABNORMAL LOW (ref 13.0–17.1)
LYMPH%: 20.7 % (ref 14.0–49.0)
MCH: 26.8 pg — AB (ref 27.2–33.4)
MCHC: 33.2 g/dL (ref 32.0–36.0)
MCV: 80.5 fL (ref 79.3–98.0)
MONO#: 0.6 10*3/uL (ref 0.1–0.9)
MONO%: 7.6 % (ref 0.0–14.0)
NEUT%: 69.8 % (ref 39.0–75.0)
NEUTROS ABS: 5.5 10*3/uL (ref 1.5–6.5)
Platelets: 308 10*3/uL (ref 140–400)
RBC: 4.82 10*6/uL (ref 4.20–5.82)
RDW: 13.4 % (ref 11.0–14.6)
WBC: 7.9 10*3/uL (ref 4.0–10.3)
lymph#: 1.6 10*3/uL (ref 0.9–3.3)

## 2015-10-11 MED ORDER — DEXAMETHASONE SODIUM PHOSPHATE 100 MG/10ML IJ SOLN
10.0000 mg | Freq: Once | INTRAMUSCULAR | Status: AC
  Administered 2015-10-11: 10 mg via INTRAVENOUS
  Filled 2015-10-11: qty 1

## 2015-10-11 MED ORDER — PALONOSETRON HCL INJECTION 0.25 MG/5ML
INTRAVENOUS | Status: AC
  Filled 2015-10-11: qty 5

## 2015-10-11 MED ORDER — HEPARIN SOD (PORK) LOCK FLUSH 100 UNIT/ML IV SOLN
500.0000 [IU] | Freq: Once | INTRAVENOUS | Status: DC | PRN
  Filled 2015-10-11: qty 5

## 2015-10-11 MED ORDER — DEXTROSE 5 % IV SOLN
Freq: Once | INTRAVENOUS | Status: AC
  Administered 2015-10-11: 09:00:00 via INTRAVENOUS

## 2015-10-11 MED ORDER — SODIUM CHLORIDE 0.9 % IV SOLN
2400.0000 mg/m2 | INTRAVENOUS | Status: DC
  Administered 2015-10-11: 5550 mg via INTRAVENOUS
  Filled 2015-10-11: qty 111

## 2015-10-11 MED ORDER — SODIUM CHLORIDE 0.9% FLUSH
10.0000 mL | INTRAVENOUS | Status: DC | PRN
  Filled 2015-10-11: qty 10

## 2015-10-11 MED ORDER — PALONOSETRON HCL INJECTION 0.25 MG/5ML
0.2500 mg | Freq: Once | INTRAVENOUS | Status: AC
  Administered 2015-10-11: 0.25 mg via INTRAVENOUS

## 2015-10-11 MED ORDER — FLUOROURACIL CHEMO INJECTION 2.5 GM/50ML
400.0000 mg/m2 | Freq: Once | INTRAVENOUS | Status: AC
  Administered 2015-10-11: 900 mg via INTRAVENOUS
  Filled 2015-10-11: qty 18

## 2015-10-11 MED ORDER — OXALIPLATIN CHEMO INJECTION 100 MG/20ML
85.0000 mg/m2 | Freq: Once | INTRAVENOUS | Status: AC
  Administered 2015-10-11: 195 mg via INTRAVENOUS
  Filled 2015-10-11: qty 39

## 2015-10-11 MED ORDER — DEXTROSE 5 % IV SOLN
398.0000 mg/m2 | Freq: Once | INTRAVENOUS | Status: AC
  Administered 2015-10-11: 920 mg via INTRAVENOUS
  Filled 2015-10-11: qty 46

## 2015-10-11 NOTE — Patient Instructions (Addendum)
[facility] Discharge Instructions for Patients Receiving Chemotherapy  Today you received the following chemotherapy agents:  Oxaliplatin, leucovorin, and 5FU.  To help prevent nausea and vomiting after your treatment, we encourage you to take your nausea medication as directed.   If you develop nausea and vomiting that is not controlled by your nausea medication, call the clinic.   BELOW ARE SYMPTOMS THAT SHOULD BE REPORTED IMMEDIATELY:  *FEVER GREATER THAN 100.5 F  *CHILLS WITH OR WITHOUT FEVER  NAUSEA AND VOMITING THAT IS NOT CONTROLLED WITH YOUR NAUSEA MEDICATION  *UNUSUAL SHORTNESS OF BREATH  *UNUSUAL BRUISING OR BLEEDING  TENDERNESS IN MOUTH AND THROAT WITH OR WITHOUT PRESENCE OF ULCERS  *URINARY PROBLEMS  *BOWEL PROBLEMS  UNUSUAL RASH Items with * indicate a potential emergency and should be followed up as soon as possible.  Feel free to call the clinic you have any questions or concerns. The clinic phone number is [phone].  Please show the [facility] at check-in to the Emergency Department and triage nurse.  Oxaliplatin Injection What is this medicine? OXALIPLATIN (ox AL i PLA tin) is a chemotherapy drug. It targets fast dividing cells, like cancer cells, and causes these cells to die. This medicine is used to treat cancers of the colon and rectum, and many other cancers. This medicine may be used for other purposes; ask your health care provider or pharmacist if you have questions. What should I tell my health care provider before I take this medicine? They need to know if you have any of these conditions: -kidney disease -an unusual or allergic reaction to oxaliplatin, other chemotherapy, other medicines, foods, dyes, or preservatives -pregnant or trying to get pregnant -breast-feeding How should I use this medicine? This drug is given as an infusion into a vein. It is administered in a hospital or clinic by a specially trained health  care professional. Talk to your pediatrician regarding the use of this medicine in children. Special care may be needed. Overdosage: If you think you have taken too much of this medicine contact a poison control center or emergency room at once. NOTE: This medicine is only for you. Do not share this medicine with others. What if I miss a dose? It is important not to miss a dose. Call your doctor or health care professional if you are unable to keep an appointment. What may interact with this medicine? -medicines to increase blood counts like filgrastim, pegfilgrastim, sargramostim -probenecid -some antibiotics like amikacin, gentamicin, neomycin, polymyxin B, streptomycin, tobramycin -zalcitabine Talk to your doctor or health care professional before taking any of these medicines: -acetaminophen -aspirin -ibuprofen -ketoprofen -naproxen This list may not describe all possible interactions. Give your health care provider a list of all the medicines, herbs, non-prescription drugs, or dietary supplements you use. Also tell them if you smoke, drink alcohol, or use illegal drugs. Some items may interact with your medicine. What should I watch for while using this medicine? Your condition will be monitored carefully while you are receiving this medicine. You will need important blood work done while you are taking this medicine. This medicine can make you more sensitive to cold. Do not drink cold drinks or use ice. Cover exposed skin before coming in contact with cold temperatures or cold objects. When out in cold weather wear warm clothing and cover your mouth and nose to warm the air that goes into your lungs. Tell your doctor if you get sensitive to the cold. This drug may make  you feel generally unwell. This is not uncommon, as chemotherapy can affect healthy cells as well as cancer cells. Report any side effects. Continue your course of treatment even though you feel ill unless your doctor tells  you to stop. In some cases, you may be given additional medicines to help with side effects. Follow all directions for their use. Call your doctor or health care professional for advice if you get a fever, chills or sore throat, or other symptoms of a cold or flu. Do not treat yourself. This drug decreases your body's ability to fight infections. Try to avoid being around people who are sick. This medicine may increase your risk to bruise or bleed. Call your doctor or health care professional if you notice any unusual bleeding. Be careful brushing and flossing your teeth or using a toothpick because you may get an infection or bleed more easily. If you have any dental work done, tell your dentist you are receiving this medicine. Avoid taking products that contain aspirin, acetaminophen, ibuprofen, naproxen, or ketoprofen unless instructed by your doctor. These medicines may hide a fever. Do not become pregnant while taking this medicine. Women should inform their doctor if they wish to become pregnant or think they might be pregnant. There is a potential for serious side effects to an unborn child. Talk to your health care professional or pharmacist for more information. Do not breast-feed an infant while taking this medicine. Call your doctor or health care professional if you get diarrhea. Do not treat yourself. What side effects may I notice from receiving this medicine? Side effects that you should report to your doctor or health care professional as soon as possible: -allergic reactions like skin rash, itching or hives, swelling of the face, lips, or tongue -low blood counts - This drug may decrease the number of white blood cells, red blood cells and platelets. You may be at increased risk for infections and bleeding. -signs of infection - fever or chills, cough, sore throat, pain or difficulty passing urine -signs of decreased platelets or bleeding - bruising, pinpoint red spots on the skin,  black, tarry stools, nosebleeds -signs of decreased red blood cells - unusually weak or tired, fainting spells, lightheadedness -breathing problems -chest pain, pressure -cough -diarrhea -jaw tightness -mouth sores -nausea and vomiting -pain, swelling, redness or irritation at the injection site -pain, tingling, numbness in the hands or feet -problems with balance, talking, walking -redness, blistering, peeling or loosening of the skin, including inside the mouth -trouble passing urine or change in the amount of urine Side effects that usually do not require medical attention (report to your doctor or health care professional if they continue or are bothersome): -changes in vision -constipation -hair loss -loss of appetite -metallic taste in the mouth or changes in taste -stomach pain This list may not describe all possible side effects. Call your doctor for medical advice about side effects. You may report side effects to FDA at 1-800-FDA-1088. Where should I keep my medicine? This drug is given in a hospital or clinic and will not be stored at home. NOTE: This sheet is a summary. It may not cover all possible information. If you have questions about this medicine, talk to your doctor, pharmacist, or health care provider.    2016, Elsevier/Gold Standard. (2007-10-12 17:22:47)  Leucovorin injection What is this medicine? LEUCOVORIN (loo koe VOR in) is used to prevent or treat the harmful effects of some medicines. This medicine is used to treat anemia caused  by a low amount of folic acid in the body. It is also used with 5-fluorouracil (5-FU) to treat colon cancer. This medicine may be used for other purposes; ask your health care provider or pharmacist if you have questions. What should I tell my health care provider before I take this medicine? They need to know if you have any of these conditions: -anemia from low levels of vitamin B-12 in the blood -an unusual or allergic  reaction to leucovorin, folic acid, other medicines, foods, dyes, or preservatives -pregnant or trying to get pregnant -breast-feeding How should I use this medicine? This medicine is for injection into a muscle or into a vein. It is given by a health care professional in a hospital or clinic setting. Talk to your pediatrician regarding the use of this medicine in children. Special care may be needed. Overdosage: If you think you have taken too much of this medicine contact a poison control center or emergency room at once. NOTE: This medicine is only for you. Do not share this medicine with others. What if I miss a dose? This does not apply. What may interact with this medicine? -capecitabine -fluorouracil -phenobarbital -phenytoin -primidone -trimethoprim-sulfamethoxazole This list may not describe all possible interactions. Give your health care provider a list of all the medicines, herbs, non-prescription drugs, or dietary supplements you use. Also tell them if you smoke, drink alcohol, or use illegal drugs. Some items may interact with your medicine. What should I watch for while using this medicine? Your condition will be monitored carefully while you are receiving this medicine. This medicine may increase the side effects of 5-fluorouracil, 5-FU. Tell your doctor or health care professional if you have diarrhea or mouth sores that do not get better or that get worse. What side effects may I notice from receiving this medicine? Side effects that you should report to your doctor or health care professional as soon as possible: -allergic reactions like skin rash, itching or hives, swelling of the face, lips, or tongue -breathing problems -fever, infection -mouth sores -unusual bleeding or bruising -unusually weak or tired Side effects that usually do not require medical attention (report to your doctor or health care professional if they continue or are bothersome): -constipation or  diarrhea -loss of appetite -nausea, vomiting This list may not describe all possible side effects. Call your doctor for medical advice about side effects. You may report side effects to FDA at 1-800-FDA-1088. Where should I keep my medicine? This drug is given in a hospital or clinic and will not be stored at home. NOTE: This sheet is a summary. It may not cover all possible information. If you have questions about this medicine, talk to your doctor, pharmacist, or health care provider.    2016, Elsevier/Gold Standard. (2007-09-21 16:50:29)  Fluorouracil, 5-FU injection What is this medicine? FLUOROURACIL, 5-FU (flure oh YOOR a sil) is a chemotherapy drug. It slows the growth of cancer cells. This medicine is used to treat many types of cancer like breast cancer, colon or rectal cancer, pancreatic cancer, and stomach cancer. This medicine may be used for other purposes; ask your health care provider or pharmacist if you have questions. What should I tell my health care provider before I take this medicine? They need to know if you have any of these conditions: -blood disorders -dihydropyrimidine dehydrogenase (DPD) deficiency -infection (especially a virus infection such as chickenpox, cold sores, or herpes) -kidney disease -liver disease -malnourished, poor nutrition -recent or ongoing radiation therapy -an  unusual or allergic reaction to fluorouracil, other chemotherapy, other medicines, foods, dyes, or preservatives -pregnant or trying to get pregnant -breast-feeding How should I use this medicine? This drug is given as an infusion or injection into a vein. It is administered in a hospital or clinic by a specially trained health care professional. Talk to your pediatrician regarding the use of this medicine in children. Special care may be needed. Overdosage: If you think you have taken too much of this medicine contact a poison control center or emergency room at once. NOTE: This  medicine is only for you. Do not share this medicine with others. What if I miss a dose? It is important not to miss your dose. Call your doctor or health care professional if you are unable to keep an appointment. What may interact with this medicine? -allopurinol -cimetidine -dapsone -digoxin -hydroxyurea -leucovorin -levamisole -medicines for seizures like ethotoin, fosphenytoin, phenytoin -medicines to increase blood counts like filgrastim, pegfilgrastim, sargramostim -medicines that treat or prevent blood clots like warfarin, enoxaparin, and dalteparin -methotrexate -metronidazole -pyrimethamine -some other chemotherapy drugs like busulfan, cisplatin, estramustine, vinblastine -trimethoprim -trimetrexate -vaccines Talk to your doctor or health care professional before taking any of these medicines: -acetaminophen -aspirin -ibuprofen -ketoprofen -naproxen This list may not describe all possible interactions. Give your health care provider a list of all the medicines, herbs, non-prescription drugs, or dietary supplements you use. Also tell them if you smoke, drink alcohol, or use illegal drugs. Some items may interact with your medicine. What should I watch for while using this medicine? Visit your doctor for checks on your progress. This drug may make you feel generally unwell. This is not uncommon, as chemotherapy can affect healthy cells as well as cancer cells. Report any side effects. Continue your course of treatment even though you feel ill unless your doctor tells you to stop. In some cases, you may be given additional medicines to help with side effects. Follow all directions for their use. Call your doctor or health care professional for advice if you get a fever, chills or sore throat, or other symptoms of a cold or flu. Do not treat yourself. This drug decreases your body's ability to fight infections. Try to avoid being around people who are sick. This medicine may  increase your risk to bruise or bleed. Call your doctor or health care professional if you notice any unusual bleeding. Be careful brushing and flossing your teeth or using a toothpick because you may get an infection or bleed more easily. If you have any dental work done, tell your dentist you are receiving this medicine. Avoid taking products that contain aspirin, acetaminophen, ibuprofen, naproxen, or ketoprofen unless instructed by your doctor. These medicines may hide a fever. Do not become pregnant while taking this medicine. Women should inform their doctor if they wish to become pregnant or think they might be pregnant. There is a potential for serious side effects to an unborn child. Talk to your health care professional or pharmacist for more information. Do not breast-feed an infant while taking this medicine. Men should inform their doctor if they wish to father a child. This medicine may lower sperm counts. Do not treat diarrhea with over the counter products. Contact your doctor if you have diarrhea that lasts more than 2 days or if it is severe and watery. This medicine can make you more sensitive to the sun. Keep out of the sun. If you cannot avoid being in the sun, wear protective clothing and  use sunscreen. Do not use sun lamps or tanning beds/booths. What side effects may I notice from receiving this medicine? Side effects that you should report to your doctor or health care professional as soon as possible: -allergic reactions like skin rash, itching or hives, swelling of the face, lips, or tongue -low blood counts - this medicine may decrease the number of white blood cells, red blood cells and platelets. You may be at increased risk for infections and bleeding. -signs of infection - fever or chills, cough, sore throat, pain or difficulty passing urine -signs of decreased platelets or bleeding - bruising, pinpoint red spots on the skin, black, tarry stools, blood in the urine -signs  of decreased red blood cells - unusually weak or tired, fainting spells, lightheadedness -breathing problems -changes in vision -chest pain -mouth sores -nausea and vomiting -pain, swelling, redness at site where injected -pain, tingling, numbness in the hands or feet -redness, swelling, or sores on hands or feet -stomach pain -unusual bleeding Side effects that usually do not require medical attention (report to your doctor or health care professional if they continue or are bothersome): -changes in finger or toe nails -diarrhea -dry or itchy skin -hair loss -headache -loss of appetite -sensitivity of eyes to the light -stomach upset -unusually teary eyes This list may not describe all possible side effects. Call your doctor for medical advice about side effects. You may report side effects to FDA at 1-800-FDA-1088. Where should I keep my medicine? This drug is given in a hospital or clinic and will not be stored at home. NOTE: This sheet is a summary. It may not cover all possible information. If you have questions about this medicine, talk to your doctor, pharmacist, or health care provider.    2016, Elsevier/Gold Standard. (2007-07-21 13:53:16)

## 2015-10-12 LAB — CEA: CEA1: 1.9 ng/mL (ref 0.0–4.7)

## 2015-10-13 ENCOUNTER — Ambulatory Visit (HOSPITAL_BASED_OUTPATIENT_CLINIC_OR_DEPARTMENT_OTHER): Payer: PRIVATE HEALTH INSURANCE

## 2015-10-13 VITALS — BP 131/84 | HR 75 | Temp 98.0°F | Resp 18

## 2015-10-13 DIAGNOSIS — C187 Malignant neoplasm of sigmoid colon: Secondary | ICD-10-CM

## 2015-10-13 DIAGNOSIS — Z452 Encounter for adjustment and management of vascular access device: Secondary | ICD-10-CM

## 2015-10-13 MED ORDER — SODIUM CHLORIDE 0.9% FLUSH
10.0000 mL | INTRAVENOUS | Status: DC | PRN
  Administered 2015-10-13: 10 mL
  Filled 2015-10-13: qty 10

## 2015-10-13 MED ORDER — HEPARIN SOD (PORK) LOCK FLUSH 100 UNIT/ML IV SOLN
500.0000 [IU] | Freq: Once | INTRAVENOUS | Status: AC | PRN
  Administered 2015-10-13: 500 [IU]
  Filled 2015-10-13: qty 5

## 2015-10-15 NOTE — Progress Notes (Signed)
GI Location of Tumor / Histology: Colon Cancer  Marc King presented  months ago with symptoms of: Right lower quadrant pain,   Biopsies of  (if applicable) revealed: Diagnosis 08/07/2015: 1. Colon, polyp(s), cecum, polyp - HYPERPLASTIC POLYP (X1). - ASSOCIATED BENIGN LYMPHOID AGGREGATE(S) AND PROMINENT BENIGN ADIPOSE TISSUE.- NO DYSPLASIA OR MALIGNANCY IDENTIFIED. 2. Colon, biopsy, sigmoid - POSITIVE FOR ADENOCARCINOMA  Past/Anticipated interventions by surgeon, if any: Diagnosis 10/03/2015: Lymph node, needle/core biopsy, Retroperitoneal- BENIGN LYMPH NODAL TISSUE.- THERE IS NO EVIDENCE OF MALIGNANCY. Cystoscopy  With b/l retrograde pyelogram and b/l  Ureteral stent placement   6/15/117:   [id number]: Dr. Merleen Nicely, MD  1. Soft tissue, biopsy, right, possible Ureter ACUTE AND CHRONIC INFLAMMATIONREACTIVE FIBROSIS 2. 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 Colon, resection margin (donut), distal anastomotic ring BENIGN COLONIC TISSUE Microscopic Comment 2. COLON AND RECTUM (INCLUDING TRANS-ANAL RESECTION):  Past/Anticipated interventions by medical oncology, if any: Dr. Benay Spice note 10/05/2015: Chemotherapy education done, Chemotherapy 1st infusion 10/11/15,  Cycle 2   10/25/15  Weight changes, if any:  Bowel/Bladder complaints, if any:   Nausea / Vomiting, if any:   Pain issues, if any:no  Any blood per rectum:   no  SAFETY ISSUES: no  Prior radiation? no  Pacemaker/ICD? n0  Is the patient on methotrexate? 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

## 2015-10-18 ENCOUNTER — Ambulatory Visit
Admission: RE | Admit: 2015-10-18 | Discharge: 2015-10-18 | Disposition: A | Discharge status: Home / Self Care | Payer: PRIVATE HEALTH INSURANCE | Source: Ambulatory Visit | Attending: Radiation Oncology | Admitting: Radiation Oncology

## 2015-10-18 ENCOUNTER — Encounter: Payer: Self-pay | Admitting: Radiation Oncology

## 2015-10-18 VITALS — BP 133/86 | HR 72 | Temp 97.9°F | Resp 16 | Ht 71.0 in | Wt 231.2 lb

## 2015-10-18 VITALS — BP 133/86 | HR 72 | Temp 97.9°F | Resp 16 | Ht 71.0 in | Wt 231.3 lb

## 2015-10-18 DIAGNOSIS — Z87891 Personal history of nicotine dependence: Secondary | ICD-10-CM | POA: Insufficient documentation

## 2015-10-18 DIAGNOSIS — C187 Malignant neoplasm of sigmoid colon: Secondary | ICD-10-CM

## 2015-10-18 DIAGNOSIS — Z510 Encounter for antineoplastic radiation therapy: Secondary | ICD-10-CM | POA: Insufficient documentation

## 2015-10-18 NOTE — Progress Notes (Signed)
GI Location of Tumor / Histology: Colon Cancer  Marc King presented  months ago with symptoms of: Right lower quadrant pain,   Biopsies of  (if applicable) revealed: Diagnosis 08/07/2015: 1. Colon, polyp(s), cecum, polyp - HYPERPLASTIC POLYP (X1). - ASSOCIATED BENIGN LYMPHOID AGGREGATE(S) AND PROMINENT BENIGN ADIPOSE TISSUE.- NO DYSPLASIA OR MALIGNANCY IDENTIFIED. 2. Colon, biopsy, sigmoid - POSITIVE FOR ADENOCARCINOMA  Past/Anticipated interventions by surgeon, if any: Diagnosis 10/03/2015: Lymph node, needle/core biopsy, Retroperitoneal- BENIGN LYMPH NODAL TISSUE.- THERE IS NO EVIDENCE OF MALIGNANCY. Cystoscopy  With b/l retrograde pyelogram and b/l  Ureteral stent placement   6/15/117:   [id number]: Dr. Merleen Nicely, MD  1. Soft tissue, biopsy, right, possible Ureter ACUTE AND CHRONIC INFLAMMATIONREACTIVE FIBROSIS 2. 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 Colon, resection margin (donut), distal anastomotic ring BENIGN COLONIC TISSUE Microscopic Comment 2. COLON AND RECTUM (INCLUDING TRANS-ANAL RESECTION):  Past/Anticipated interventions by medical oncology, if any: Dr. Benay Spice note 10/05/2015: Chemotherapy education done, Chemotherapy 1st infusion 10/11/15,  Cycle 2   10/25/15  Weight changes, if any:30 lb weight loss since March 2017, gained 7lb back  Bowel/Bladder complaints, if any: constipation at times, takes miralax prn, regular bladder  Nausea / Vomiting, if any: None  Pain issues, if any: None now  Any blood per rectum:   none  SAFETY ISSUES: No  Prior radiation? NO  Pacemaker/ICD? NO  Is the patient on methotrexate? No  Current Complaints/Details: Former tobacco smoker <1ppd  for 13 years; quit 11 years ago,    Remotely quit alcohol and illicit drug use  Father Melanoma bottom of foot, , living,  Mother Leukemia  Dormant, living  DM,HTN, Maternal great grandmother colon cancer  Allergies: NKA BP 133/86 mmHg  Pulse 72  Temp(Src) 97.9 F (36.6 C) (Oral)  Resp 16  Ht 5\' 11"  (1.803 m)  Wt 231 lb 4.8 oz (104.917 kg)  BMI 32.27 kg/m2  SpO2 100%  Wt Readings from Last 3 Encounters:  10/18/15 231 lb 4.8 oz (104.917 kg)  10/05/15 235 lb 3.2 oz (106.686 kg)  10/03/15 231 lb (104.781 kg)

## 2015-10-18 NOTE — Progress Notes (Addendum)
Radiation Oncology         [phone] ________________________________  Name: JAMESANDREW King MRN: [id number]  Date: 10/18/2015  DOB: 07-16-1975  [id number] L., MD  [name], MD     REFERRING PHYSICIAN: [name], MD   DIAGNOSIS: The encounter diagnosis was Cancer of sigmoid colon [facility]).   HISTORY OF PRESENT ILLNESS: Marc King is a 40 y.o. male with a recent diagnosis of adenocarcinoma of the sigmoid colon. The patient initially was found to have thickening of the sigmoid colon on CT imaging in April 2007. Adjacent lymphadenopathy was seen. CT scan of the chest subsequently in May showed bilateral pulmonary nodules suspicious for metastases. A PET scan was completed on 09/05/2015. The sigmoid colon mass was markedly hypermetabolic. Numerous lymph nodes in the sigmoid music: Were seen which were essentially too small for PET resolution. However an 8 mm right external iliac chain lymph node had a maximum SUV of 5.2. There also was concern for hypermetabolic lymph nodes tracking superiorly within the retroperitoneal area. Additional suspicious lymphadenopathy more superiorly. The patient proceeded to undergo a sigmoidectomy and cystoscopy on 09/13/2015. This revealed an adenocarcinoma measuring 7.0 cm. The tumor invaded through the colonic wall and was adherent to the pelvic sidewall and anterior peritoneum. The final margins were negative. 28 lymph nodes all were negative as well.  The patient has subsequently undergone a biopsy of a retroperitoneal lymph node and this was negative. The patient's case has been discussed in multidisciplinary GI clinic. The patient has seen Dr. Benay Spice for consideration of postoperative chemotherapy and I have been asked to see the patient for consideration of postoperative radiation treatment as well.    PREVIOUS RADIATION THERAPY: No   PAST MEDICAL HISTORY:  Past Medical History  Diagnosis Date  . Scarlet fever   . Cancer of  sigmoid colon ([place]) 08/07/2015       PAST SURGICAL HISTORY: Past Surgical History  Procedure Laterality Date  . Vasectomy    . Laparoscopic partial colectomy N/A 09/13/2015    Procedure: LAPAROSCOPIC SIGMOIDECTOMY;  Surgeon: Leighton Ruff, MD;  Location: [facility];  Service: General;  Laterality: N/A;  . Proctoscopy  09/13/2015    Procedure: PROCTOSCOPY;  Surgeon: Leighton Ruff, MD;  Location: [facility];  Service: General;;  . Cystoscopy with stent placement Bilateral 09/13/2015    Procedure: CYSTOSCOPY WITH BILATERAL STENT PLACEMENT;  Surgeon: Festus Aloe, MD;  Location: [facility];  Service: Urology;  Laterality: Bilateral;     FAMILY HISTORY:  Family History  Problem Relation Age of Onset  . Melanoma Father   . Leukemia Mother   . Diabetes Mother   . Colitis Sister   . Irritable bowel syndrome Sister   . Celiac disease Sister   . Clotting disorder Father   . Heart failure Maternal Grandmother     during surgery  . Anuerysm Paternal Grandmother     brain  . Anuerysm Maternal Grandfather     stomach     SOCIAL HISTORY:  reports that he quit smoking about 11 years ago. His smoking use included Cigarettes. He has never used smokeless tobacco. He reports that he does not drink alcohol or use illicit drugs.   ALLERGIES: Review of patient's allergies indicates no known allergies.   MEDICATIONS:  Current Outpatient Prescriptions  Medication Sig Dispense Refill  . acetaminophen (TYLENOL) 500 MG tablet Take 1,000 mg by mouth every 6 (six) hours as needed (For pain.).     Marland Kitchen ibuprofen (ADVIL,MOTRIN) 200 MG  tablet Take 200-400 mg by mouth every 6 (six) hours as needed (For pain.).    Marland Kitchen lidocaine-prilocaine (EMLA) cream Apply to port site one hour prior to use. Do not rub in, cover with plastic 30 g 1  . oxyCODONE (OXY IR/ROXICODONE) 5 MG immediate release tablet Take 1-2 tablets (5-10 mg total) by mouth every 4 (four) hours as needed for moderate pain. 40 tablet 0  . polyethylene glycol  (MIRALAX / GLYCOLAX) packet Take 17 g by mouth daily as needed.    . prochlorperazine (COMPAZINE) 10 MG tablet Take 1 tablet (10 mg total) by mouth every 6 (six) hours as needed for nausea or vomiting. 30 tablet 1   No current facility-administered medications for this encounter.     REVIEW OF SYSTEMS: Fully reviewed and documented in the medical chart.     PHYSICAL EXAM:  height is 5\' 11"  (1.803 m) and weight is 231 lb 3.2 oz (104.872 kg). His oral temperature is 97.9 F (36.6 C). His blood pressure is 133/86 and his pulse is 72. His respiration is 16 and oxygen saturation is 100%.   General: Well-developed, in no acute distress HEENT: Normocephalic, atraumatic; oral cavity clear Neck: Supple without any lymphadenopathy Cardiovascular: Regular rate and rhythm Respiratory: Clear to auscultation bilaterally GI: Soft, nontender, normal bowel sounds;  well-healed surgical incision present Extremities: No edema present Neuro: No focal deficits     ECOG = 0  0 - Asymptomatic (Fully active, able to carry on all predisease activities without restriction)  1 - Symptomatic but completely ambulatory (Restricted in physically strenuous activity but ambulatory and able to carry out work of a light or sedentary nature. For example, light housework, office work)  2 - Symptomatic, <50% in bed during the day (Ambulatory and capable of all self care but unable to carry out any work activities. Up and about more than 50% of waking hours)  3 - Symptomatic, >50% in bed, but not bedbound (Capable of only limited self-care, confined to bed or chair 50% or more of waking hours)  4 - Bedbound (Completely disabled. Cannot carry on any self-care. Totally confined to bed or chair)  5 - Death   Eustace Pen MM, Creech RH, Tormey DC, et al. [phone]). "Toxicity and response criteria of the [facility] [employer]". [place] Oncol. 5 (6): 649-55    LABORATORY DATA:  Lab Results  Component Value  Date   WBC 7.9 10/11/2015   HGB 12.9* 10/11/2015   HCT 38.8 10/11/2015   MCV 80.5 10/11/2015   PLT 308 10/11/2015   Lab Results  Component Value Date   NA 137 10/11/2015   K 4.3 10/11/2015   CL 105 10/03/2015   CO2 23 10/11/2015   Lab Results  Component Value Date   ALT 16 10/11/2015   AST 18 10/11/2015   ALKPHOS 49 10/11/2015   BILITOT 0.42 10/11/2015      RADIOGRAPHY: Ir Fluoro Guide Cv Line Right  10/03/2015  INDICATION: 40 year old male with a history of colon carcinoma. Patient presents today for port catheter. EXAM: IMPLANTED PORT A CATH PLACEMENT WITH ULTRASOUND AND FLUOROSCOPIC GUIDANCE MEDICATIONS: 2.0 g Ancef; The antibiotic was administered within an appropriate time interval prior to skin puncture. ANESTHESIA/SEDATION: Moderate (conscious) sedation was employed during this procedure. A total of Versed 1.0 mg and Fentanyl 25 mcg was administered intravenously. Moderate Sedation Time: 12 minutes. The patient's level of consciousness and vital signs were monitored continuously by radiology nursing throughout the procedure under my direct  supervision. FLUOROSCOPY TIME:  Zero minutes, 48 seconds (8 mGy) COMPLICATIONS: None PROCEDURE: The procedure, risks, benefits, and alternatives were explained to the patient. Questions regarding the procedure were encouraged and answered. The patient understands and consents to the procedure. Ultrasound survey was performed with images stored and sent to PACs. The right neck and chest was prepped with chlorhexidine, and draped in the usual sterile fashion using maximum barrier technique (cap and mask, sterile gown, sterile gloves, large sterile sheet, hand hygiene and cutaneous antiseptic). Antibiotic prophylaxis was provided with 2.0g Ancef administered IV one hour prior to skin incision. Local anesthesia was attained by infiltration with 1% lidocaine without epinephrine. Ultrasound demonstrated patency of the right internal jugular vein, and this  was documented with an image. Under real-time ultrasound guidance, this vein was accessed with a 21 gauge micropuncture needle and image documentation was performed. A small dermatotomy was made at the access site with an 11 scalpel. A 0.018" wire was advanced into the SVC and used to estimate the length of the internal catheter. The access needle exchanged for a 34F micropuncture vascular sheath. The 0.018" wire was then removed and a 0.035" wire advanced into the IVC. An appropriate location for the subcutaneous reservoir was selected below the clavicle and an incision was made through the skin and underlying soft tissues. The subcutaneous tissues were then dissected using a combination of blunt and sharp surgical technique and a pocket was formed. A single lumen power injectable portacatheter was then tunneled through the subcutaneous tissues from the pocket to the dermatotomy and the port reservoir placed within the subcutaneous pocket. The venous access site was then serially dilated and a peel away vascular sheath placed over the wire. The wire was removed and the port catheter advanced into position under fluoroscopic guidance. The catheter tip is positioned in the cavoatrial junction. This was documented with a spot image. The portacatheter was then tested and found to flush and aspirate well. The port was flushed with saline followed by 100 units/mL heparinized saline. The pocket was then closed in two layers using first subdermal inverted interrupted absorbable sutures followed by a running subcuticular suture. The epidermis was then sealed with Dermabond. The dermatotomy at the venous access site was also seal with Dermabond. Patient tolerated the procedure well and remained hemodynamically stable throughout. No complications encountered and no significant blood loss encountered IMPRESSION: Status post right IJ port catheter placement. Catheter ready for use. Signed, Dulcy Fanny. Earleen Newport, DO [facility] Rad[facility] Electronically Signed   By: Corrie Mckusick D.O.   On: 10/03/2015 13:32   Ir US Guide Vasc Access Right  10/03/2015  INDICATION: 40 year old male with a history of colon carcinoma. Patient presents today for port catheter. EXAM: IMPLANTED PORT A CATH PLACEMENT WITH ULTRASOUND AND FLUOROSCOPIC GUIDANCE MEDICATIONS: 2.0 g Ancef; The antibiotic was administered within an appropriate time interval prior to skin puncture. ANESTHESIA/SEDATION: Moderate (conscious) sedation was employed during this procedure. A total of Versed 1.0 mg and Fentanyl 25 mcg was administered intravenously. Moderate Sedation Time: 12 minutes. The patient's level of consciousness and vital signs were monitored continuously by radiology nursing throughout the procedure under my direct supervision. FLUOROSCOPY TIME:  Zero minutes, 48 seconds (8 mGy) COMPLICATIONS: None PROCEDURE: The procedure, risks, benefits, and alternatives were explained to the patient. Questions regarding the procedure were encouraged and answered. The patient understands and consents to the procedure. Ultrasound survey was performed with images stored and sent to PACs. The right neck  and chest was prepped with chlorhexidine, and draped in the usual sterile fashion using maximum barrier technique (cap and mask, sterile gown, sterile gloves, large sterile sheet, hand hygiene and cutaneous antiseptic). Antibiotic prophylaxis was provided with 2.0g Ancef administered IV one hour prior to skin incision. Local anesthesia was attained by infiltration with 1% lidocaine without epinephrine. Ultrasound demonstrated patency of the right internal jugular vein, and this was documented with an image. Under real-time ultrasound guidance, this vein was accessed with a 21 gauge micropuncture needle and image documentation was performed. A small dermatotomy was made at the access site with an 11 scalpel. A 0.018" wire was advanced into  the SVC and used to estimate the length of the internal catheter. The access needle exchanged for a 30F micropuncture vascular sheath. The 0.018" wire was then removed and a 0.035" wire advanced into the IVC. An appropriate location for the subcutaneous reservoir was selected below the clavicle and an incision was made through the skin and underlying soft tissues. The subcutaneous tissues were then dissected using a combination of blunt and sharp surgical technique and a pocket was formed. A single lumen power injectable portacatheter was then tunneled through the subcutaneous tissues from the pocket to the dermatotomy and the port reservoir placed within the subcutaneous pocket. The venous access site was then serially dilated and a peel away vascular sheath placed over the wire. The wire was removed and the port catheter advanced into position under fluoroscopic guidance. The catheter tip is positioned in the cavoatrial junction. This was documented with a spot image. The portacatheter was then tested and found to flush and aspirate well. The port was flushed with saline followed by 100 units/mL heparinized saline. The pocket was then closed in two layers using first subdermal inverted interrupted absorbable sutures followed by a running subcuticular suture. The epidermis was then sealed with Dermabond. The dermatotomy at the venous access site was also seal with Dermabond. Patient tolerated the procedure well and remained hemodynamically stable throughout. No complications encountered and no significant blood loss encountered IMPRESSION: Status post right IJ port catheter placement. Catheter ready for use. Signed, Dulcy Fanny. Earleen Newport, DO [facility] Radiology Electronically Signed   By: Corrie Mckusick D.O.   On: 10/03/2015 13:32   Ct Biopsy  10/03/2015  INDICATION: 40 year old male with a history of colon carcinoma. FDG avid retroperitoneal adenopathy was evident on  PET-CT performed 09/05/2015 EXAM: CT BIOPSY MEDICATIONS: None. ANESTHESIA/SEDATION: Moderate (conscious) sedation was employed during this procedure. A total of Versed 2.0 mg and Fentanyl 100 mcg was administered intravenously Moderate Sedation Time: 17 minutes. The patient's level of consciousness and vital signs were monitored continuously by radiology nursing throughout the procedure under my direct supervision. FLUOROSCOPY TIME:  CT COMPLICATIONS: None PROCEDURE: Informed written consent was obtained from the patient after a thorough discussion of the procedural risks, benefits and alternatives. All questions were addressed. Maximal Sterile Barrier Technique was utilized including caps, mask, sterile gowns, sterile gloves, sterile drape, hand hygiene and skin antiseptic. A timeout was performed prior to the initiation of the procedure. Patient positioned prone position on the CT gantry table. Scout CT of the abdomen was performed for planning purposes. Once we determine the lymph node for biopsy within the left retroperitoneum, the patient was prepped and draped in the usual sterile fashion. The skin and subcutaneous tissues were generously infiltrated 1% lidocaine for local anesthesia. A small stab incision was made with 11 blade scalpel. Using CT guidance, a  15 cm 17 gauge trocar needle was advanced into the targeted lymph node. A coaxial Chiba needle through the 17 gauge guide needle confirmed biopsy trajectory. Once we confirmed position, multiple 18 gauge core biopsy were retrieved and placed into formalin solution. Needle was removed. Patient tolerated the procedure well and remained hemodynamically stable throughout. No complications were encountered and no significant blood loss encountered. IMPRESSION: Status post CT-guided biopsy of left retroperitoneal lymph node which was FDG positive on prior CT. Tissue specimen sent to pathology for complete histopathologic analysis. Signed, Dulcy Fanny. Earleen Newport, DO  [facility] Radiology Electronically Signed   By: Corrie Mckusick D.O.   On: 10/03/2015 13:30       IMPRESSION: Locally advanced adenocarcinoma of the sigmoid colon status post laparoscopic sigmoidectomy.   PLAN: Dr. Lisbeth Renshaw discusses the findings from pathology, imaging studies, and reviews the discussion at GI conference from last week. Dr. Lisbeth Renshaw discusses that the margin status was amended to reflect that the final margin was negative. However, the tumor was adherent to the anterior peritoneum, the pelvic sidewall, and the bladder. When this was discussed in GI conference, concern still existed regarding true margin status with respect to these areas. Given the patient's young age and operative findings, He believes it would be very reasonable to proceed with postoperative chemoradiation treatment. The patient has begun chemotherapy and we will coordinate with medical oncology the appropriate time to begin adjuvant radiation treatment as well. We would anticipate that he would receive radiosensitizing chemotherapy during an approximate 5-1/2-6 week course of radiation treatment. All of this was discussed with the patient and he indicated that he did wish to proceed with radiation treatment at the appropriate time.    The above documentation reflects my direct findings during this shared patient visit. Please see the separate note by Dr. Lisbeth Renshaw on this date for the remainder of the patient's plan of care.    Carola Rhine, PAC

## 2015-10-18 NOTE — Progress Notes (Signed)
Please see the Nurse Progress Note in the MD Initial Consult Encounter for this patient. 

## 2015-10-19 ENCOUNTER — Telehealth: Payer: Self-pay | Admitting: Oncology

## 2015-10-19 ENCOUNTER — Telehealth: Payer: Self-pay | Admitting: Radiation Oncology

## 2015-10-19 NOTE — Telephone Encounter (Signed)
Faxed records to [employer]

## 2015-10-19 NOTE — Addendum Note (Signed)
Encounter addended by: Hayden Pedro, PA-C on: 10/19/2015  2:47 PM<BR>     Documentation filed: Notes Section

## 2015-10-20 NOTE — Telephone Encounter (Signed)
LM for the pt regarding our discussion with Dr. Benay Spice for 4 cycles of chemo followed by sandwhich chemo/rads and an additional 4 cycles of chemo.

## 2015-10-21 ENCOUNTER — Other Ambulatory Visit: Payer: Self-pay | Admitting: Oncology

## 2015-10-25 ENCOUNTER — Other Ambulatory Visit: Payer: PRIVATE HEALTH INSURANCE

## 2015-10-25 ENCOUNTER — Ambulatory Visit: Payer: PRIVATE HEALTH INSURANCE

## 2015-10-25 ENCOUNTER — Ambulatory Visit (HOSPITAL_BASED_OUTPATIENT_CLINIC_OR_DEPARTMENT_OTHER): Payer: PRIVATE HEALTH INSURANCE | Admitting: Nurse Practitioner

## 2015-10-25 ENCOUNTER — Telehealth: Payer: Self-pay | Admitting: Nurse Practitioner

## 2015-10-25 ENCOUNTER — Ambulatory Visit: Payer: PRIVATE HEALTH INSURANCE | Admitting: Nurse Practitioner

## 2015-10-25 ENCOUNTER — Ambulatory Visit: Payer: PRIVATE HEALTH INSURANCE | Admitting: Oncology

## 2015-10-25 ENCOUNTER — Ambulatory Visit (HOSPITAL_BASED_OUTPATIENT_CLINIC_OR_DEPARTMENT_OTHER): Payer: PRIVATE HEALTH INSURANCE

## 2015-10-25 ENCOUNTER — Other Ambulatory Visit (HOSPITAL_BASED_OUTPATIENT_CLINIC_OR_DEPARTMENT_OTHER): Payer: PRIVATE HEALTH INSURANCE

## 2015-10-25 ENCOUNTER — Telehealth: Payer: Self-pay | Admitting: *Deleted

## 2015-10-25 VITALS — BP 139/92 | HR 66 | Temp 98.4°F | Resp 18 | Ht 71.0 in | Wt 234.6 lb

## 2015-10-25 DIAGNOSIS — D509 Iron deficiency anemia, unspecified: Secondary | ICD-10-CM

## 2015-10-25 DIAGNOSIS — Z5111 Encounter for antineoplastic chemotherapy: Secondary | ICD-10-CM

## 2015-10-25 DIAGNOSIS — C187 Malignant neoplasm of sigmoid colon: Secondary | ICD-10-CM

## 2015-10-25 DIAGNOSIS — D701 Agranulocytosis secondary to cancer chemotherapy: Secondary | ICD-10-CM | POA: Diagnosis not present

## 2015-10-25 LAB — CBC WITH DIFFERENTIAL/PLATELET
BASO%: 0.8 % (ref 0.0–2.0)
Basophils Absolute: 0 10*3/uL (ref 0.0–0.1)
EOS ABS: 0.1 10*3/uL (ref 0.0–0.5)
EOS%: 3.5 % (ref 0.0–7.0)
HCT: 38 % — ABNORMAL LOW (ref 38.4–49.9)
HEMOGLOBIN: 12.7 g/dL — AB (ref 13.0–17.1)
LYMPH%: 42.4 % (ref 14.0–49.0)
MCH: 26 pg — ABNORMAL LOW (ref 27.2–33.4)
MCHC: 33.4 g/dL (ref 32.0–36.0)
MCV: 77.7 fL — AB (ref 79.3–98.0)
MONO#: 0.9 10*3/uL (ref 0.1–0.9)
MONO%: 23.3 % — ABNORMAL HIGH (ref 0.0–14.0)
NEUT%: 30 % — ABNORMAL LOW (ref 39.0–75.0)
NEUTROS ABS: 1.2 10*3/uL — AB (ref 1.5–6.5)
PLATELETS: 152 10*3/uL (ref 140–400)
RBC: 4.89 10*6/uL (ref 4.20–5.82)
RDW: 13.8 % (ref 11.0–14.6)
WBC: 4 10*3/uL (ref 4.0–10.3)
lymph#: 1.7 10*3/uL (ref 0.9–3.3)

## 2015-10-25 LAB — COMPREHENSIVE METABOLIC PANEL
ALBUMIN: 3.8 g/dL (ref 3.5–5.0)
ALK PHOS: 56 U/L (ref 40–150)
ALT: 17 U/L (ref 0–55)
ANION GAP: 9 meq/L (ref 3–11)
AST: 22 U/L (ref 5–34)
BILIRUBIN TOTAL: 0.63 mg/dL (ref 0.20–1.20)
BUN: 9 mg/dL (ref 7.0–26.0)
CO2: 26 meq/L (ref 22–29)
CREATININE: 0.9 mg/dL (ref 0.7–1.3)
Calcium: 9.3 mg/dL (ref 8.4–10.4)
Chloride: 104 mEq/L (ref 98–109)
GLUCOSE: 158 mg/dL — AB (ref 70–140)
Potassium: 3.9 mEq/L (ref 3.5–5.1)
SODIUM: 138 meq/L (ref 136–145)
TOTAL PROTEIN: 7.5 g/dL (ref 6.4–8.3)

## 2015-10-25 MED ORDER — SODIUM CHLORIDE 0.9 % IV SOLN
10.0000 mg | Freq: Once | INTRAVENOUS | Status: AC
  Administered 2015-10-25: 10 mg via INTRAVENOUS
  Filled 2015-10-25: qty 1

## 2015-10-25 MED ORDER — OXALIPLATIN CHEMO INJECTION 100 MG/20ML
85.0000 mg/m2 | Freq: Once | INTRAVENOUS | Status: AC
  Administered 2015-10-25: 195 mg via INTRAVENOUS
  Filled 2015-10-25: qty 20

## 2015-10-25 MED ORDER — PALONOSETRON HCL INJECTION 0.25 MG/5ML
0.2500 mg | Freq: Once | INTRAVENOUS | Status: AC
  Administered 2015-10-25: 0.25 mg via INTRAVENOUS

## 2015-10-25 MED ORDER — DEXTROSE 5 % IV SOLN
Freq: Once | INTRAVENOUS | Status: AC
  Administered 2015-10-25: 14:00:00 via INTRAVENOUS

## 2015-10-25 MED ORDER — PALONOSETRON HCL INJECTION 0.25 MG/5ML
INTRAVENOUS | Status: AC
  Filled 2015-10-25: qty 5

## 2015-10-25 MED ORDER — SODIUM CHLORIDE 0.9 % IV SOLN
2400.0000 mg/m2 | INTRAVENOUS | Status: DC
  Administered 2015-10-25: 5550 mg via INTRAVENOUS
  Filled 2015-10-25: qty 111

## 2015-10-25 MED ORDER — LEUCOVORIN CALCIUM INJECTION 350 MG
398.0000 mg/m2 | Freq: Once | INTRAMUSCULAR | Status: AC
  Administered 2015-10-25: 920 mg via INTRAVENOUS
  Filled 2015-10-25: qty 46

## 2015-10-25 MED ORDER — FLUOROURACIL CHEMO INJECTION 2.5 GM/50ML
400.0000 mg/m2 | Freq: Once | INTRAVENOUS | Status: AC
  Administered 2015-10-25: 900 mg via INTRAVENOUS
  Filled 2015-10-25: qty 18

## 2015-10-25 NOTE — Patient Instructions (Signed)
Begin ferrous sulfate 325 mg twice daily 

## 2015-10-25 NOTE — Progress Notes (Signed)
[facility] OFFICE PROGRESS NOTE   Diagnosis:  Colon cancer  INTERVAL HISTORY:   Marc King returns as scheduled. He completed cycle 1 FOLFOX 10/11/2015. He denies nausea/vomiting. No mouth sores. He had one episode of loose stools. He did have some abdominal cramps which he thinks was likely diet related. Cold sensitivity lasted 4 days. No persistent neuropathy symptoms. He denies pain. He has a good appetite. No bleeding.  Objective:  Vital signs in last 24 hours:  Blood pressure (!) 139/92, pulse 66, temperature 98.4 F (36.9 C), temperature source Oral, resp. rate 18, height '5\' 11"'$  (1.803 m), weight 234 lb 9.6 oz (106.4 kg), SpO2 100 %.    HEENT: No thrush or ulcers. Resp: Lungs clear bilaterally. Cardio: Regular rate and rhythm. GI: Abdomen soft and nontender. No hepatomegaly. Vascular: No leg edema. Skin surrounding the Port-A-Cath is erythematous in a distribution consistent with a reaction to the dressing.   Lab Results:  Lab Results  Component Value Date   WBC 4.0 10/25/2015   HGB 12.7 (L) 10/25/2015   HCT 38.0 (L) 10/25/2015   MCV 77.7 (L) 10/25/2015   PLT 152 10/25/2015   NEUTROABS 1.2 (L) 10/25/2015    Imaging:  No results found.  Medications: I have reviewed the patient's current medications.  Assessment/Plan: 1. Adenocarcinoma the sigmoid colon, status post a colonoscopic biopsy 08/07/2015 ? CT abdomen/pelvis 07/05/2015 with masslike thickening of the sigmoid colon, adjacent lymphadenopathy, and prominent para-aortic and porta hepatis nodes ? CT chest 08/28/2015 with bilateral pulmonary nodules suspicious for metastases ? PET scan 09/05/2015 with sigmoid colon mass SUV max 18.4; numerous lymph nodes in the sigmoid mesocolon most of which are too small for PET resolution; 8 mm right external iliac chain lymph node SUV max 5.2; hypermetabolic lymph nodes tracking superiorly along the retroperitoneum extending to the aortocaval station at the  level of the renal arteries; index lymph node at the level of the aortic bifurcation measures 12 mm with an SUV max of 4.5; hepato duodenal lymph node measures 10 mm with SUV max 3.6; no abnormal hypermetabolism in the liver, adrenal glands, spleen or pancreas. A few scattered pulmonary nodules which do not show abnormal hypermetabolism, too small for PET resolution. ? 09/13/2015 status post laparoscopic sigmoidectomy and cystoscopy with bilateral stent placement ? Preserved expression of the major and minor MMR proteins. MSI-stable ? 7 cm adenocarcinoma of the sigmoid colon, grade 2; tumor invades through the colonic wall and adherent to the pelvic sidewall and anterior peritoneum; 28 benign lymph nodes ? CT biopsy of a hypermetabolic left retroperitoneal lymph node on 10/03/2015-pathology negative for malignancy ? Cycle 1 FOLFOX 10/11/2015 ? Cycle 2 FOLFOX 10/25/2015, Neulasta added  2. Right lower abdominal pain secondary to #1  3.    Mild neutropenia secondary to chemotherapy 10/25/2015. Neulasta added beginning with cycle 2.  4.    Microcytic anemia. Question iron deficiency. He will begin an iron supplement.   Disposition: Marc King appears stable. He has completed 1 cycle of FOLFOX. Plan to proceed with cycle 2 today as scheduled.  The neutrophil count is mildly decreased. He will receive Neulasta on the day of pump discontinuation. We reviewed potential toxicities associate with Neulasta including bone pain, rash, splenic rupture. He is agreeable to proceed.  He has a mild anemia, microcytic. This is likely iron deficiency. He will begin ferrous sulfate 325 mg twice daily.  He will return for a follow-up visit and cycle 3 FOLFOX in 2 weeks. He will contact  the office in the interim with any problems.  Plan reviewed with Dr. Benay Spice.    Ned Card ANP/GNP-BC   10/25/2015  12:41 PM

## 2015-10-25 NOTE — Patient Instructions (Signed)
[facility] Discharge Instructions for Patients Receiving Chemotherapy  Today you received the following chemotherapy agents Oxaliplatin, Leucovorin, Adrucil  To help prevent nausea and vomiting after your treatment, we encourage you to take your nausea medication    If you develop nausea and vomiting that is not controlled by your nausea medication, call the clinic.   BELOW ARE SYMPTOMS THAT SHOULD BE REPORTED IMMEDIATELY:  *FEVER GREATER THAN 100.5 F  *CHILLS WITH OR WITHOUT FEVER  NAUSEA AND VOMITING THAT IS NOT CONTROLLED WITH YOUR NAUSEA MEDICATION  *UNUSUAL SHORTNESS OF BREATH  *UNUSUAL BRUISING OR BLEEDING  TENDERNESS IN MOUTH AND THROAT WITH OR WITHOUT PRESENCE OF ULCERS  *URINARY PROBLEMS  *BOWEL PROBLEMS  UNUSUAL RASH Items with * indicate a potential emergency and should be followed up as soon as possible.  Feel free to call the clinic you have any questions or concerns. The clinic phone number is [phone].  Please show the CHEMO ALERT CARD at check-in to the Emergency Department and triage nurse.   

## 2015-10-25 NOTE — Telephone Encounter (Signed)
per pof to sch pt appt-sent MW eail to sch trmt-pt to get updated copy b4 leaving**

## 2015-10-25 NOTE — Progress Notes (Signed)
Okay to treat today despite Neut= 1.2, per Ned Card, NP.

## 2015-10-25 NOTE — Telephone Encounter (Signed)
Per staff message and POF I have scheduled appts. Advised scheduler of appts. JMW  

## 2015-10-27 ENCOUNTER — Ambulatory Visit (HOSPITAL_BASED_OUTPATIENT_CLINIC_OR_DEPARTMENT_OTHER): Payer: PRIVATE HEALTH INSURANCE

## 2015-10-27 VITALS — BP 148/97 | HR 77 | Temp 99.1°F | Resp 18

## 2015-10-27 DIAGNOSIS — C187 Malignant neoplasm of sigmoid colon: Secondary | ICD-10-CM | POA: Diagnosis not present

## 2015-10-27 DIAGNOSIS — D701 Agranulocytosis secondary to cancer chemotherapy: Secondary | ICD-10-CM | POA: Diagnosis not present

## 2015-10-27 MED ORDER — HEPARIN SOD (PORK) LOCK FLUSH 100 UNIT/ML IV SOLN
500.0000 [IU] | Freq: Once | INTRAVENOUS | Status: AC | PRN
  Administered 2015-10-27: 500 [IU]
  Filled 2015-10-27: qty 5

## 2015-10-27 MED ORDER — PEGFILGRASTIM INJECTION 6 MG/0.6ML ~~LOC~~
6.0000 mg | PREFILLED_SYRINGE | Freq: Once | SUBCUTANEOUS | Status: AC
  Administered 2015-10-27: 6 mg via SUBCUTANEOUS

## 2015-10-27 MED ORDER — SODIUM CHLORIDE 0.9% FLUSH
10.0000 mL | INTRAVENOUS | Status: DC | PRN
  Administered 2015-10-27: 10 mL
  Filled 2015-10-27: qty 10

## 2015-11-04 ENCOUNTER — Other Ambulatory Visit: Payer: Self-pay | Admitting: Oncology

## 2015-11-08 ENCOUNTER — Other Ambulatory Visit (HOSPITAL_BASED_OUTPATIENT_CLINIC_OR_DEPARTMENT_OTHER): Payer: PRIVATE HEALTH INSURANCE

## 2015-11-08 ENCOUNTER — Encounter: Payer: Self-pay | Admitting: *Deleted

## 2015-11-08 ENCOUNTER — Telehealth: Payer: Self-pay

## 2015-11-08 ENCOUNTER — Ambulatory Visit (HOSPITAL_BASED_OUTPATIENT_CLINIC_OR_DEPARTMENT_OTHER): Payer: PRIVATE HEALTH INSURANCE

## 2015-11-08 ENCOUNTER — Telehealth: Payer: Self-pay | Admitting: *Deleted

## 2015-11-08 ENCOUNTER — Ambulatory Visit (HOSPITAL_BASED_OUTPATIENT_CLINIC_OR_DEPARTMENT_OTHER): Payer: PRIVATE HEALTH INSURANCE | Admitting: Oncology

## 2015-11-08 VITALS — BP 124/83 | HR 77 | Temp 98.4°F | Resp 18 | Ht 71.0 in | Wt 237.4 lb

## 2015-11-08 DIAGNOSIS — C187 Malignant neoplasm of sigmoid colon: Secondary | ICD-10-CM | POA: Diagnosis not present

## 2015-11-08 DIAGNOSIS — D509 Iron deficiency anemia, unspecified: Secondary | ICD-10-CM

## 2015-11-08 DIAGNOSIS — Z5111 Encounter for antineoplastic chemotherapy: Secondary | ICD-10-CM | POA: Diagnosis not present

## 2015-11-08 DIAGNOSIS — D701 Agranulocytosis secondary to cancer chemotherapy: Secondary | ICD-10-CM | POA: Diagnosis not present

## 2015-11-08 DIAGNOSIS — D6959 Other secondary thrombocytopenia: Secondary | ICD-10-CM

## 2015-11-08 LAB — COMPREHENSIVE METABOLIC PANEL
ALBUMIN: 3.6 g/dL (ref 3.5–5.0)
ALK PHOS: 112 U/L (ref 40–150)
ALT: 18 U/L (ref 0–55)
AST: 23 U/L (ref 5–34)
Anion Gap: 9 mEq/L (ref 3–11)
BUN: 12.7 mg/dL (ref 7.0–26.0)
CALCIUM: 9.5 mg/dL (ref 8.4–10.4)
CHLORIDE: 102 meq/L (ref 98–109)
CO2: 25 mEq/L (ref 22–29)
Creatinine: 1 mg/dL (ref 0.7–1.3)
GLUCOSE: 301 mg/dL — AB (ref 70–140)
POTASSIUM: 4.6 meq/L (ref 3.5–5.1)
SODIUM: 136 meq/L (ref 136–145)
Total Bilirubin: 0.38 mg/dL (ref 0.20–1.20)
Total Protein: 7.4 g/dL (ref 6.4–8.3)

## 2015-11-08 LAB — CBC WITH DIFFERENTIAL/PLATELET
BASO%: 0.2 % (ref 0.0–2.0)
Basophils Absolute: 0 10*3/uL (ref 0.0–0.1)
EOS ABS: 0.1 10*3/uL (ref 0.0–0.5)
EOS%: 0.6 % (ref 0.0–7.0)
HCT: 41 % (ref 38.4–49.9)
HGB: 13.6 g/dL (ref 13.0–17.1)
LYMPH%: 18.7 % (ref 14.0–49.0)
MCH: 26.2 pg — ABNORMAL LOW (ref 27.2–33.4)
MCHC: 33.2 g/dL (ref 32.0–36.0)
MCV: 78.8 fL — AB (ref 79.3–98.0)
MONO#: 0.9 10*3/uL (ref 0.1–0.9)
MONO%: 8.6 % (ref 0.0–14.0)
NEUT%: 71.9 % (ref 39.0–75.0)
NEUTROS ABS: 7.8 10*3/uL — AB (ref 1.5–6.5)
PLATELETS: 115 10*3/uL — AB (ref 140–400)
RBC: 5.2 10*6/uL (ref 4.20–5.82)
RDW: 15.7 % — ABNORMAL HIGH (ref 11.0–14.6)
WBC: 10.9 10*3/uL — AB (ref 4.0–10.3)
lymph#: 2 10*3/uL (ref 0.9–3.3)

## 2015-11-08 MED ORDER — SODIUM CHLORIDE 0.9 % IV SOLN
Freq: Once | INTRAVENOUS | Status: AC
  Administered 2015-11-08: 10:00:00 via INTRAVENOUS
  Filled 2015-11-08: qty 5

## 2015-11-08 MED ORDER — SODIUM CHLORIDE 0.9% FLUSH
10.0000 mL | INTRAVENOUS | Status: DC | PRN
  Filled 2015-11-08: qty 10

## 2015-11-08 MED ORDER — FLUOROURACIL CHEMO INJECTION 2.5 GM/50ML
400.0000 mg/m2 | Freq: Once | INTRAVENOUS | Status: AC
  Administered 2015-11-08: 900 mg via INTRAVENOUS
  Filled 2015-11-08: qty 18

## 2015-11-08 MED ORDER — PALONOSETRON HCL INJECTION 0.25 MG/5ML
0.2500 mg | Freq: Once | INTRAVENOUS | Status: AC
  Administered 2015-11-08: 0.25 mg via INTRAVENOUS

## 2015-11-08 MED ORDER — SODIUM CHLORIDE 0.9 % IV SOLN
2400.0000 mg/m2 | INTRAVENOUS | Status: DC
  Administered 2015-11-08: 5550 mg via INTRAVENOUS
  Filled 2015-11-08: qty 111

## 2015-11-08 MED ORDER — DEXTROSE 5 % IV SOLN
Freq: Once | INTRAVENOUS | Status: AC
  Administered 2015-11-08: 10:00:00 via INTRAVENOUS

## 2015-11-08 MED ORDER — LEUCOVORIN CALCIUM INJECTION 350 MG
398.0000 mg/m2 | Freq: Once | INTRAVENOUS | Status: AC
  Administered 2015-11-08: 920 mg via INTRAVENOUS
  Filled 2015-11-08: qty 46

## 2015-11-08 MED ORDER — OXALIPLATIN CHEMO INJECTION 100 MG/20ML
85.0000 mg/m2 | Freq: Once | INTRAVENOUS | Status: AC
  Administered 2015-11-08: 195 mg via INTRAVENOUS
  Filled 2015-11-08: qty 39

## 2015-11-08 MED ORDER — PALONOSETRON HCL INJECTION 0.25 MG/5ML
INTRAVENOUS | Status: AC
  Filled 2015-11-08: qty 5

## 2015-11-08 NOTE — Progress Notes (Signed)
Oncology Nurse Navigator Documentation  Oncology Nurse Navigator Flowsheets 11/08/2015  Navigator Location [facility]  Navigator Encounter Type Treatment  Telephone -  Abnormal Finding Date -  Confirmed Diagnosis Date -  Surgery Date 09/13/2015  Treatment Initiated Date 10/11/2015  Patient Visit Type MedOnc  Treatment Phase Active Tx  Barriers/Navigation Needs Education;Coordination of Care  Education Pain/ Symptom Management;Understanding Cancer/ Treatment Options--discussed the RT/Xeloda after he completes #4 cycles of FOLFOX. Asking about trying Melatonin at night for sleep--informed him OK to try this or Tylenol PM if this is not effective.  Interventions Referrals;Education Method  Referrals Genetics--agrees to genetics referral in October after he has his RT completed.  Coordination of Care -  Education Method Verbal;Teach-back  Support Groups/Services -  Acuity Level 1  Time Spent with Patient 30  Planning to start RT/Xeloda around 12/17/15 (going on a vacation). Would be able to have SIM week of 9/4 if possible. Sent in basket to [place], [place] with Dr. Lisbeth Renshaw with this information. Marc King overall is feeling well and still working full time.

## 2015-11-08 NOTE — Telephone Encounter (Signed)
-----   Message from Owens Shark, NP sent at 11/08/2015  2:31 PM EDT ----- Please let him know his blood sugar was 300 earlier today. Instruct him to avoid concentrated sweets. Please have him return for a fingerstick glucose tomorrow.

## 2015-11-08 NOTE — Patient Instructions (Signed)
[facility] Discharge Instructions for Patients Receiving Chemotherapy  Today you received the following chemotherapy agents Oxaliplatin, Leucovorin, Adrucil  To help prevent nausea and vomiting after your treatment, we encourage you to take your nausea medication    If you develop nausea and vomiting that is not controlled by your nausea medication, call the clinic.   BELOW ARE SYMPTOMS THAT SHOULD BE REPORTED IMMEDIATELY:  *FEVER GREATER THAN 100.5 F  *CHILLS WITH OR WITHOUT FEVER  NAUSEA AND VOMITING THAT IS NOT CONTROLLED WITH YOUR NAUSEA MEDICATION  *UNUSUAL SHORTNESS OF BREATH  *UNUSUAL BRUISING OR BLEEDING  TENDERNESS IN MOUTH AND THROAT WITH OR WITHOUT PRESENCE OF ULCERS  *URINARY PROBLEMS  *BOWEL PROBLEMS  UNUSUAL RASH Items with * indicate a potential emergency and should be followed up as soon as possible.  Feel free to call the clinic you have any questions or concerns. The clinic phone number is [phone].  Please show the CHEMO ALERT CARD at check-in to the Emergency Department and triage nurse.   

## 2015-11-08 NOTE — Telephone Encounter (Signed)
Called patient and informed him of glucose results from today and to avoid concentrated sweets. Pt verbalized understanding. Requested pt come back for recheck tomorrow. Patient stated he has a glucometer at home. Informed Ned Card who would like patient to check it now since pt received decadron today and call back with result and to also check tonight and in the morning and call tomorrow with the results. Pt verbalized understanding and will call with results.

## 2015-11-08 NOTE — Telephone Encounter (Signed)
Call placed to pt to inform him to check his glucose in the AM and to call [place] if glucose is 400 or greater and to notify his primary MD of increased glucose per Dr. Benay Spice.  Pt verbalized an understanding of all information and is appreciative of call.

## 2015-11-08 NOTE — Progress Notes (Signed)
[place] OFFICE PROGRESS NOTE   Diagnosis: Colon cancer  INTERVAL HISTORY:   Marc King returns as scheduled. He completed another cycle of FOLFOX on 10/25/2015. He reports cold sensitivity lasting 7-8 days following chemotherapy. He had mild nausea for several days following chemotherapy. He did not take nausea medication. No peripheral neuropathy symptoms. He has constipation. No mouth sores or hand/foot pain. He is working.  Objective:  Vital signs in last 24 hours:  Blood pressure 124/83, pulse 77, temperature 98.4 F (36.9 C), temperature source Oral, resp. rate 18, height '5\' 11"'$  (1.803 m), weight 237 lb 6.4 oz (107.7 kg), SpO2 99 %.    HEENT: No thrush or ulcers Resp: Lungs clear bilaterally Cardio: Regular rate and rhythm GI: No hepatosplenomegaly, nontender Vascular: No leg edema  Skin: Palms without erythema   Portacath-erythema in a tape distribution  Lab Results:  Lab Results  Component Value Date   WBC 10.9 (H) 11/08/2015   HGB 13.6 11/08/2015   HCT 41.0 11/08/2015   MCV 78.8 (L) 11/08/2015   PLT 115 (L) 11/08/2015   NEUTROABS 7.8 (H) 11/08/2015      Lab Results  Component Value Date   CEA1 1.9 10/11/2015    Medications: I have reviewed the patient's current medications.  Assessment/Plan: 1. Adenocarcinoma the sigmoid colon, status post a colonoscopic biopsy 08/07/2015 ? CT abdomen/pelvis 07/05/2015 with masslike thickening of the sigmoid colon, adjacent lymphadenopathy, and prominent para-aortic and porta hepatis nodes ? CT chest 08/28/2015 with bilateral pulmonary nodules suspicious for metastases ? PET scan 09/05/2015 with sigmoid colon mass SUV max 18.4; numerous lymph nodes in the sigmoid mesocolon most of which are too small for PET resolution; 8 mm right external iliac chain lymph node SUV max 5.2; hypermetabolic lymph nodes tracking superiorly along the retroperitoneum extending to the aortocaval station at the level of the  renal arteries; index lymph node at the level of the aortic bifurcation measures 12 mm with an SUV max of 4.5; hepato duodenal lymph node measures 10 mm with SUV max 3.6; no abnormal hypermetabolism in the liver, adrenal glands, spleen or pancreas. A few scattered pulmonary nodules which do not show abnormal hypermetabolism, too small for PET resolution. ? 09/13/2015 status post laparoscopic sigmoidectomy and cystoscopy with bilateral stent placement ? Preserved expression of the major and minor MMR proteins. MSI-stable ? 7 cm adenocarcinoma of the sigmoid colon, grade 2; tumor invades through the colonic wall and adherent to the pelvic sidewall and anterior peritoneum; 28 benign lymph nodes ? CT biopsy of a hypermetabolic left retroperitoneal lymph node on 10/03/2015-pathology negative for malignancy ? Cycle 1 FOLFOX 10/11/2015 ? Cycle 2 FOLFOX 10/25/2015, Neulasta added ? Cycle 3 FOLFOX 11/08/2015  2. History of Right lower abdominal pain secondary to #1  3.    Mild neutropenia secondary to chemotherapy 10/25/2015. Neulasta added beginning with cycle 2.  4.    Microcytic anemia. Question iron deficiency. He is taking iron  5.    Thrombocytopenia secondary to chemotherapy    Disposition:  Marc King appears well. He will complete cycle 3 FOLFOX today. We will adjust the anti-emetic regimen by adding Emend for the delayed nausea. He has mild thrombocytopenia. Marc King will return for an office visit and cycle 4 FOLFOX in 2 weeks.  The plan is to begin adjuvant radiation and concurrent Xeloda following cycle 4. He plans a vacation prior to beginning radiation. Radiation will likely begin on 12/17/2015.   Betsy Coder, MD  11/08/2015  9:18 AM

## 2015-11-10 ENCOUNTER — Ambulatory Visit (HOSPITAL_BASED_OUTPATIENT_CLINIC_OR_DEPARTMENT_OTHER): Payer: PRIVATE HEALTH INSURANCE

## 2015-11-10 ENCOUNTER — Ambulatory Visit: Payer: PRIVATE HEALTH INSURANCE

## 2015-11-10 VITALS — BP 145/94 | HR 69 | Temp 98.4°F | Resp 18

## 2015-11-10 DIAGNOSIS — C187 Malignant neoplasm of sigmoid colon: Secondary | ICD-10-CM

## 2015-11-10 MED ORDER — PEGFILGRASTIM INJECTION 6 MG/0.6ML ~~LOC~~
6.0000 mg | PREFILLED_SYRINGE | Freq: Once | SUBCUTANEOUS | Status: AC
  Administered 2015-11-10: 6 mg via SUBCUTANEOUS

## 2015-11-10 MED ORDER — SODIUM CHLORIDE 0.9% FLUSH
10.0000 mL | INTRAVENOUS | Status: DC | PRN
  Administered 2015-11-10: 10 mL
  Filled 2015-11-10: qty 10

## 2015-11-10 MED ORDER — HEPARIN SOD (PORK) LOCK FLUSH 100 UNIT/ML IV SOLN
500.0000 [IU] | Freq: Once | INTRAVENOUS | Status: AC | PRN
  Administered 2015-11-10: 500 [IU]
  Filled 2015-11-10: qty 5

## 2015-11-10 NOTE — Patient Instructions (Signed)

## 2015-11-18 ENCOUNTER — Other Ambulatory Visit: Payer: Self-pay | Admitting: Oncology

## 2015-11-22 ENCOUNTER — Ambulatory Visit (HOSPITAL_BASED_OUTPATIENT_CLINIC_OR_DEPARTMENT_OTHER): Payer: PRIVATE HEALTH INSURANCE | Admitting: Oncology

## 2015-11-22 ENCOUNTER — Encounter (HOSPITAL_COMMUNITY): Payer: Self-pay

## 2015-11-22 ENCOUNTER — Encounter: Payer: Self-pay | Admitting: Pharmacist

## 2015-11-22 ENCOUNTER — Other Ambulatory Visit (HOSPITAL_BASED_OUTPATIENT_CLINIC_OR_DEPARTMENT_OTHER): Payer: PRIVATE HEALTH INSURANCE

## 2015-11-22 ENCOUNTER — Ambulatory Visit (HOSPITAL_BASED_OUTPATIENT_CLINIC_OR_DEPARTMENT_OTHER): Payer: PRIVATE HEALTH INSURANCE

## 2015-11-22 VITALS — BP 123/70 | HR 85 | Temp 98.6°F | Resp 20 | Ht 71.0 in | Wt 230.7 lb

## 2015-11-22 DIAGNOSIS — D6959 Other secondary thrombocytopenia: Secondary | ICD-10-CM | POA: Diagnosis not present

## 2015-11-22 DIAGNOSIS — D701 Agranulocytosis secondary to cancer chemotherapy: Secondary | ICD-10-CM

## 2015-11-22 DIAGNOSIS — C187 Malignant neoplasm of sigmoid colon: Secondary | ICD-10-CM | POA: Diagnosis not present

## 2015-11-22 DIAGNOSIS — D509 Iron deficiency anemia, unspecified: Secondary | ICD-10-CM | POA: Diagnosis not present

## 2015-11-22 DIAGNOSIS — Z5111 Encounter for antineoplastic chemotherapy: Secondary | ICD-10-CM | POA: Diagnosis not present

## 2015-11-22 LAB — COMPREHENSIVE METABOLIC PANEL
ALK PHOS: 154 U/L — AB (ref 40–150)
ALT: 24 U/L (ref 0–55)
AST: 32 U/L (ref 5–34)
Albumin: 3.6 g/dL (ref 3.5–5.0)
Anion Gap: 9 mEq/L (ref 3–11)
BILIRUBIN TOTAL: 0.63 mg/dL (ref 0.20–1.20)
BUN: 9.5 mg/dL (ref 7.0–26.0)
CHLORIDE: 105 meq/L (ref 98–109)
CO2: 24 meq/L (ref 22–29)
Calcium: 9.4 mg/dL (ref 8.4–10.4)
Creatinine: 1 mg/dL (ref 0.7–1.3)
GLUCOSE: 192 mg/dL — AB (ref 70–140)
POTASSIUM: 4.1 meq/L (ref 3.5–5.1)
SODIUM: 137 meq/L (ref 136–145)
Total Protein: 7.2 g/dL (ref 6.4–8.3)

## 2015-11-22 LAB — CBC WITH DIFFERENTIAL/PLATELET
BASO%: 0.5 % (ref 0.0–2.0)
BASOS ABS: 0 10*3/uL (ref 0.0–0.1)
EOS ABS: 0.1 10*3/uL (ref 0.0–0.5)
EOS%: 0.6 % (ref 0.0–7.0)
HCT: 42.8 % (ref 38.4–49.9)
HGB: 14.1 g/dL (ref 13.0–17.1)
LYMPH%: 17.4 % (ref 14.0–49.0)
MCH: 25.7 pg — AB (ref 27.2–33.4)
MCHC: 32.9 g/dL (ref 32.0–36.0)
MCV: 78.1 fL — AB (ref 79.3–98.0)
MONO#: 1 10*3/uL — ABNORMAL HIGH (ref 0.1–0.9)
MONO%: 11 % (ref 0.0–14.0)
NEUT#: 6.7 10*3/uL — ABNORMAL HIGH (ref 1.5–6.5)
NEUT%: 70.5 % (ref 39.0–75.0)
Platelets: 128 10*3/uL — ABNORMAL LOW (ref 140–400)
RBC: 5.48 10*6/uL (ref 4.20–5.82)
RDW: 18.3 % — AB (ref 11.0–14.6)
WBC: 9.4 10*3/uL (ref 4.0–10.3)
lymph#: 1.6 10*3/uL (ref 0.9–3.3)

## 2015-11-22 MED ORDER — PALONOSETRON HCL INJECTION 0.25 MG/5ML
INTRAVENOUS | Status: AC
  Filled 2015-11-22: qty 5

## 2015-11-22 MED ORDER — OXALIPLATIN CHEMO INJECTION 100 MG/20ML
85.0000 mg/m2 | Freq: Once | INTRAVENOUS | Status: AC
  Administered 2015-11-22: 195 mg via INTRAVENOUS
  Filled 2015-11-22: qty 39

## 2015-11-22 MED ORDER — LEUCOVORIN CALCIUM INJECTION 350 MG
398.0000 mg/m2 | Freq: Once | INTRAVENOUS | Status: AC
  Administered 2015-11-22: 920 mg via INTRAVENOUS
  Filled 2015-11-22: qty 46

## 2015-11-22 MED ORDER — SODIUM CHLORIDE 0.9 % IV SOLN
Freq: Once | INTRAVENOUS | Status: AC
  Administered 2015-11-22: 09:00:00 via INTRAVENOUS
  Filled 2015-11-22: qty 5

## 2015-11-22 MED ORDER — FLUOROURACIL CHEMO INJECTION 2.5 GM/50ML
400.0000 mg/m2 | Freq: Once | INTRAVENOUS | Status: AC
Start: 2015-11-22 — End: 2015-11-22
  Administered 2015-11-22: 900 mg via INTRAVENOUS
  Filled 2015-11-22: qty 18

## 2015-11-22 MED ORDER — DEXTROSE 5 % IV SOLN
Freq: Once | INTRAVENOUS | Status: AC
  Administered 2015-11-22: 09:00:00 via INTRAVENOUS

## 2015-11-22 MED ORDER — PALONOSETRON HCL INJECTION 0.25 MG/5ML
0.2500 mg | Freq: Once | INTRAVENOUS | Status: AC
  Administered 2015-11-22: 0.25 mg via INTRAVENOUS

## 2015-11-22 MED ORDER — SODIUM CHLORIDE 0.9 % IV SOLN
2400.0000 mg/m2 | INTRAVENOUS | Status: DC
  Administered 2015-11-22: 5550 mg via INTRAVENOUS
  Filled 2015-11-22: qty 111

## 2015-11-22 NOTE — Progress Notes (Signed)
Oral Chemotherapy Pharmacist Encounter  New prescription for Xeloda has been sent to the [facility] outpatient pharmacy for benefit analysis and approval.   Will follow up with patient regarding insurance and pharmacy once we hear from [facility] OP Rx. Will follow up in 1-2 weeks for adherence and toxicity management after he starts tx - planned start date is 9/25/7.  Thank you, Kennith Center, Pharm.D., CPP 11/22/2015@2 :30 PM Oral Chemotherapy Clinic

## 2015-11-22 NOTE — Progress Notes (Signed)
[facility] OFFICE PROGRESS NOTE   Diagnosis: Colon cancer  INTERVAL HISTORY:   Marc King returns as scheduled. He completed another cycle of FOLFOX on 11/08/2015. He reports cold sensitivity lasting 7 days following chemotherapy. He had a dry mouth following chemotherapy. He had tingling in the extremities 1 day. He had mild nausea and took Compazine once. He is working. He had mild pain following Neulasta.  Objective:  Vital signs in last 24 hours:  Blood pressure 123/70, pulse 85, temperature 98.6 F (37 C), temperature source Oral, resp. rate 20, height '5\' 11"'$  (1.803 m), weight 230 lb 11.2 oz (104.6 kg), SpO2 100 %.    HEENT: No thrush or ulcers Resp: Lungs clear bilaterally Cardio: Regular 8 and rhythm GI: No hepatomegaly, nontender Vascular: No leg edema Neuro: Mild loss of vibratory sense at the fingertips bilaterally    Portacath/PICC-without erythema  Lab Results:  Lab Results  Component Value Date   WBC 9.4 11/22/2015   HGB 14.1 11/22/2015   HCT 42.8 11/22/2015   MCV 78.1 (L) 11/22/2015   PLT 128 (L) 11/22/2015   NEUTROABS 6.7 (H) 11/22/2015      Lab Results  Component Value Date   CEA1 1.9 10/11/2015    Medications: I have reviewed the patient's current medications.  Assessment/Plan: 1. Adenocarcinoma the sigmoid colon, status post a colonoscopic biopsy 08/07/2015 ? CT abdomen/pelvis 07/05/2015 with masslike thickening of the sigmoid colon, adjacent lymphadenopathy, and prominent para-aortic and porta hepatis nodes ? CT chest 08/28/2015 with bilateral pulmonary nodules suspicious for metastases ? PET scan 09/05/2015 with sigmoid colon mass SUV max 18.4; numerous lymph nodes in the sigmoid mesocolon most of which are too small for PET resolution; 8 mm right external iliac chain lymph node SUV max 5.2; hypermetabolic lymph nodes tracking superiorly along the retroperitoneum extending to the aortocaval station at the level of the renal  arteries; index lymph node at the level of the aortic bifurcation measures 12 mm with an SUV max of 4.5; hepato duodenal lymph node measures 10 mm with SUV max 3.6; no abnormal hypermetabolism in the liver, adrenal glands, spleen or pancreas. A few scattered pulmonary nodules which do not show abnormal hypermetabolism, too small for PET resolution. ? 09/13/2015 status post laparoscopic sigmoidectomy and cystoscopy with bilateral stent placement ? Preserved expression of the major and minor MMR proteins. MSI-stable ? 7 cm adenocarcinoma of the sigmoid colon, grade 2; tumor invades through the colonic wall and adherent to the pelvic sidewall and anterior peritoneum; 28 benign lymph nodes ? CT biopsy of a hypermetabolic left retroperitoneal lymph node on 10/03/2015-pathology negative for malignancy ? Cycle 1 FOLFOX 10/11/2015 ? Cycle 2 FOLFOX 10/25/2015, Neulasta added ? Cycle 3 FOLFOX 11/08/2015 ? Cycle 4 FOLFOX the 24 2017  2. History of Right lower abdominal pain secondary to #1  3. Mild neutropenia secondary to chemotherapy 10/25/2015. Neulasta added beginning with cycle 2.  4. Microcytic anemia. Question iron deficiency. He is taking iron  5.    Thrombocytopenia secondary to chemotherapy    Disposition:  Marc King has completed 3 cycles of FOLFOX. He has tolerated chemotherapy therapy well. The plan is to proceed with cycle 4 today. He will not receive Neulasta with this cycle.  He would like to delay adjuvant radiation/Xeloda until 12/24/2015 secondary to a vacation and work obligations. He does not wish to receive another cycle of FOLFOX in 2 weeks.  We reviewed the potential toxicities associated with Xeloda including the chance for mucositis, diarrhea, and  the hand/foot syndrome. He agrees to proceed. We will make arrangements for a radiation appointment to begin treatment planning.  Marc King will return for an office visit on 12/24/2015. He knows to contact us for a  fever or symptoms of an infection. He agrees to a genetics referral.  Betsy Coder, MD  11/22/2015  8:34 AM

## 2015-11-22 NOTE — Progress Notes (Signed)
Faxed Rx to [facility].  Required by insurance and cannot be filled at [place]. Kennith Center, Pharm.D., CPP 11/22/2015@5 :08 PM Oral Chemo Clinic

## 2015-11-22 NOTE — Patient Instructions (Signed)
[facility] Discharge Instructions for Patients Receiving Chemotherapy  Today you received the following chemotherapy agents 5fu,leucovorin, oxaliplatin To help prevent nausea and vomiting after your treatment, we encourage you to take your nausea medication as prescribed.   If you develop nausea and vomiting that is not controlled by your nausea medication, call the clinic.   BELOW ARE SYMPTOMS THAT SHOULD BE REPORTED IMMEDIATELY:  *FEVER GREATER THAN 100.5 F  *CHILLS WITH OR WITHOUT FEVER  NAUSEA AND VOMITING THAT IS NOT CONTROLLED WITH YOUR NAUSEA MEDICATION  *UNUSUAL SHORTNESS OF BREATH  *UNUSUAL BRUISING OR BLEEDING  TENDERNESS IN MOUTH AND THROAT WITH OR WITHOUT PRESENCE OF ULCERS  *URINARY PROBLEMS  *BOWEL PROBLEMS  UNUSUAL RASH Items with * indicate a potential emergency and should be followed up as soon as possible.  Feel free to call the clinic you have any questions or concerns. The clinic phone number is [phone].  Please show the CHEMO ALERT CARD at check-in to the Emergency Department and triage nurse.   

## 2015-11-23 ENCOUNTER — Telehealth: Payer: Self-pay | Admitting: Genetic Counselor

## 2015-11-23 ENCOUNTER — Telehealth: Payer: Self-pay | Admitting: *Deleted

## 2015-11-23 ENCOUNTER — Encounter: Payer: Self-pay | Admitting: Genetic Counselor

## 2015-11-23 NOTE — Telephone Encounter (Signed)
Confirmed with patient that he is aware of his new SIM date and the genetics appointment as well.

## 2015-11-23 NOTE — Telephone Encounter (Signed)
Pt confirmed appt., completed intake, mailed out patient letter

## 2015-11-24 ENCOUNTER — Ambulatory Visit (HOSPITAL_BASED_OUTPATIENT_CLINIC_OR_DEPARTMENT_OTHER): Payer: PRIVATE HEALTH INSURANCE

## 2015-11-24 ENCOUNTER — Ambulatory Visit: Payer: PRIVATE HEALTH INSURANCE

## 2015-11-24 VITALS — BP 129/87 | HR 83 | Temp 98.8°F | Resp 18

## 2015-11-24 DIAGNOSIS — Z452 Encounter for adjustment and management of vascular access device: Secondary | ICD-10-CM

## 2015-11-24 DIAGNOSIS — C187 Malignant neoplasm of sigmoid colon: Secondary | ICD-10-CM | POA: Diagnosis not present

## 2015-11-24 MED ORDER — SODIUM CHLORIDE 0.9% FLUSH
10.0000 mL | INTRAVENOUS | Status: DC | PRN
  Administered 2015-11-24: 10 mL
  Filled 2015-11-24: qty 10

## 2015-11-24 MED ORDER — HEPARIN SOD (PORK) LOCK FLUSH 100 UNIT/ML IV SOLN
500.0000 [IU] | Freq: Once | INTRAVENOUS | Status: AC | PRN
  Administered 2015-11-24: 500 [IU]
  Filled 2015-11-24: qty 5

## 2015-11-24 NOTE — Patient Instructions (Signed)
[facility] Discharge Instructions for Patients Receiving Chemotherapy  Today you received the following chemotherapy agents 5-FU.  To help prevent nausea and vomiting after your treatment, we encourage you to take your nausea medication.   If you develop nausea and vomiting that is not controlled by your nausea medication, call the clinic.   BELOW ARE SYMPTOMS THAT SHOULD BE REPORTED IMMEDIATELY:  *FEVER GREATER THAN 100.5 F  *CHILLS WITH OR WITHOUT FEVER  NAUSEA AND VOMITING THAT IS NOT CONTROLLED WITH YOUR NAUSEA MEDICATION  *UNUSUAL SHORTNESS OF BREATH  *UNUSUAL BRUISING OR BLEEDING  TENDERNESS IN MOUTH AND THROAT WITH OR WITHOUT PRESENCE OF ULCERS  *URINARY PROBLEMS  *BOWEL PROBLEMS  UNUSUAL RASH Items with * indicate a potential emergency and should be followed up as soon as possible.  Feel free to call the clinic you have any questions or concerns. The clinic phone number is [phone].  Please show the CHEMO ALERT CARD at check-in to the Emergency Department and triage nurse.   

## 2015-11-25 ENCOUNTER — Telehealth: Payer: Self-pay | Admitting: Oncology

## 2015-11-25 NOTE — Telephone Encounter (Signed)
S/w pt, gave appt for 9/25 @ 9.45am.

## 2015-11-27 ENCOUNTER — Encounter: Payer: Self-pay | Admitting: Pharmacist

## 2015-11-27 NOTE — Progress Notes (Signed)
Oral Chemotherapy Pharmacist Encounter   Received call from [facility] for clarification of capecitabine Rx days. They requested clarification about how long patient will be on XRT so they could process the number of pills needed. Per Dr. Ida Rogue note on 7/20, radiation will span 5/12 - 6 weeks. Rx to be filled for enough capecitabine pills for 6 weeks.  [facility] Pharmacist ran test claim for capecitabine, it appears generic will require prior authorization, however brand name Xeloda appears to have gone through for full amount needed at co-pay = $0. The claim will be run again at the time of filling for exact co-pay amount.  [facility] will reach out to oral chemo clinic with any further questions.  [name], PharmD, BCPS Oral Chemotherapy Clinic

## 2015-12-05 ENCOUNTER — Ambulatory Visit: Payer: PRIVATE HEALTH INSURANCE | Admitting: Radiation Oncology

## 2015-12-07 ENCOUNTER — Telehealth: Payer: Self-pay | Admitting: Pharmacist

## 2015-12-07 NOTE — Telephone Encounter (Signed)
[facility]consult[facility] specialty pharmacy. Xeloda rx is $0 copay. Xeloda rx is ready for shipment. [facility] needs patient to call and confirm shipping details to release the medication. Pt should call [phone] to order the medication.  Called patient and let him know of above information.  Raul Del, PharmD, BCPS, [facility]otherapy [facility] [phone]

## 2015-12-11 ENCOUNTER — Telehealth: Payer: Self-pay | Admitting: Radiation Oncology

## 2015-12-11 NOTE — Telephone Encounter (Signed)
error 

## 2015-12-13 ENCOUNTER — Telehealth: Payer: Self-pay | Admitting: Pharmacist

## 2015-12-13 NOTE — Telephone Encounter (Signed)
Oral Chemotherapy Pharmacist Encounter   I spoke with [facility] King who stated that patient had not called them to set up shipment of Xeloda. I left another voicemail for patient with information that he needs to call [facility] before they will release the medication for shipment.  Left [facility]# [phone] on patient's voicemail to call to schedule shipment.  Pt encouraged to call oral chemo clinic with any questions.  [name], PharmD, BCPS 12/13/2015  12:26 PM Oral Chemotherapy Clinic [phone]

## 2015-12-13 NOTE — Telephone Encounter (Signed)
Oral Chemotherapy Pharmacist Encounter   I called patient today and LVM to see if patient had received Xeloda prescription for [place] and to offer any support for counseling. No answer, left voicemail.  Direct dial to oral chemo clinic left on voicemail as well as acknowledgement of next appointment 9/22 for CT simulation.  Oral Chemo Clinic will continue to follow for start date and toxicity/compliance management.  Thank you,  [name], PharmD, BCPS 12/13/2015  12:12 PM Oral Chemotherapy Clinic [phone]

## 2015-12-14 NOTE — Progress Notes (Signed)
Xeloda Follow-up  Contacted [facility] pharmacy as to the status of Xeloda. Product will be delivered to the patient on 12/20/15 prior to start date.  Will contact patient prior to start for counseling.  Thank you  Henreitta Leber, PharmD Oral Oncology Navigation Clinic

## 2015-12-21 ENCOUNTER — Ambulatory Visit
Admission: RE | Admit: 2015-12-21 | Discharge: 2015-12-21 | Disposition: A | Discharge status: Home / Self Care | Payer: PRIVATE HEALTH INSURANCE | Source: Ambulatory Visit | Attending: Radiation Oncology | Admitting: Radiation Oncology

## 2015-12-21 DIAGNOSIS — Z510 Encounter for antineoplastic radiation therapy: Secondary | ICD-10-CM | POA: Diagnosis not present

## 2015-12-21 DIAGNOSIS — C187 Malignant neoplasm of sigmoid colon: Secondary | ICD-10-CM

## 2015-12-24 ENCOUNTER — Telehealth: Payer: Self-pay | Admitting: Oncology

## 2015-12-24 ENCOUNTER — Ambulatory Visit (HOSPITAL_BASED_OUTPATIENT_CLINIC_OR_DEPARTMENT_OTHER): Payer: PRIVATE HEALTH INSURANCE | Admitting: Nurse Practitioner

## 2015-12-24 ENCOUNTER — Other Ambulatory Visit (HOSPITAL_BASED_OUTPATIENT_CLINIC_OR_DEPARTMENT_OTHER): Payer: PRIVATE HEALTH INSURANCE

## 2015-12-24 VITALS — BP 151/59 | HR 76 | Temp 98.9°F | Resp 18 | Ht 71.0 in | Wt 234.6 lb

## 2015-12-24 DIAGNOSIS — D701 Agranulocytosis secondary to cancer chemotherapy: Secondary | ICD-10-CM

## 2015-12-24 DIAGNOSIS — D509 Iron deficiency anemia, unspecified: Secondary | ICD-10-CM

## 2015-12-24 DIAGNOSIS — C187 Malignant neoplasm of sigmoid colon: Secondary | ICD-10-CM

## 2015-12-24 DIAGNOSIS — D6959 Other secondary thrombocytopenia: Secondary | ICD-10-CM | POA: Diagnosis not present

## 2015-12-24 LAB — COMPREHENSIVE METABOLIC PANEL
ALT: 30 U/L (ref 0–55)
AST: 28 U/L (ref 5–34)
Albumin: 3.7 g/dL (ref 3.5–5.0)
Alkaline Phosphatase: 56 U/L (ref 40–150)
Anion Gap: 9 mEq/L (ref 3–11)
BUN: 10.3 mg/dL (ref 7.0–26.0)
CALCIUM: 9.5 mg/dL (ref 8.4–10.4)
CHLORIDE: 105 meq/L (ref 98–109)
CO2: 25 meq/L (ref 22–29)
CREATININE: 1 mg/dL (ref 0.7–1.3)
EGFR: >90 mL/min/{1.73_m2} (ref >90)
GLUCOSE: 178 mg/dL — AB (ref 70–140)
POTASSIUM: 4.9 meq/L (ref 3.5–5.1)
SODIUM: 138 meq/L (ref 136–145)
Total Bilirubin: 0.71 mg/dL (ref 0.20–1.20)
Total Protein: 7.6 g/dL (ref 6.4–8.3)

## 2015-12-24 LAB — CBC WITH DIFFERENTIAL/PLATELET
BASO%: 1.3 % (ref 0.0–2.0)
BASOS ABS: 0.1 10*3/uL (ref 0.0–0.1)
EOS%: 1.3 % (ref 0.0–7.0)
Eosinophils Absolute: 0.1 10*3/uL (ref 0.0–0.5)
HEMATOCRIT: 47.2 % (ref 38.4–49.9)
HGB: 15.6 g/dL (ref 13.0–17.1)
LYMPH#: 1.9 10*3/uL (ref 0.9–3.3)
LYMPH%: 32 % (ref 14.0–49.0)
MCH: 27.4 pg (ref 27.2–33.4)
MCHC: 33.1 g/dL (ref 32.0–36.0)
MCV: 83 fL (ref 79.3–98.0)
MONO#: 0.7 10*3/uL (ref 0.1–0.9)
MONO%: 12.4 % (ref 0.0–14.0)
NEUT#: 3.1 10*3/uL (ref 1.5–6.5)
NEUT%: 53 % (ref 39.0–75.0)
Platelets: 174 10*3/uL (ref 140–400)
RBC: 5.69 10*6/uL (ref 4.20–5.82)
RDW: 22.4 % — AB (ref 11.0–14.6)
WBC: 5.9 10*3/uL (ref 4.0–10.3)

## 2015-12-24 MED ORDER — CAPECITABINE 500 MG PO TABS: ORAL_TABLET | ORAL | Status: DC

## 2015-12-24 NOTE — Telephone Encounter (Signed)
Gave patient avs report and appointments for October  °

## 2015-12-24 NOTE — Progress Notes (Addendum)
[facility] OFFICE PROGRESS NOTE   Diagnosis:  Colon cancer  INTERVAL HISTORY:   Marc King returns as scheduled. He completed cycle 4 FOLFOX 11/22/2015. He denies nausea/vomiting. No mouth sores. No diarrhea. He recently noted mild discomfort at the low abdominal scar after lifting something. The discomfort has resolved.  Objective:  Vital signs in last 24 hours:  Blood pressure (!) 151/59, pulse 76, temperature 98.9 F (37.2 C), temperature source Oral, resp. rate 18, height '5\' 11"'$  (1.803 m), weight 234 lb 9.6 oz (106.4 kg), SpO2 100 %.    HEENT: No thrush or ulcers. Resp: Lungs clear bilaterally. Cardio: Regular rate and rhythm. GI: Abdomen soft and nontender. No hepatomegaly. Low midline abdominal scar is nontender. Vascular: No leg edema. Port-A-Cath without erythema.  Lab Results:  Lab Results  Component Value Date   WBC 5.9 12/24/2015   HGB 15.6 12/24/2015   HCT 47.2 12/24/2015   MCV 83.0 12/24/2015   PLT 174 12/24/2015   NEUTROABS 3.1 12/24/2015    Imaging:  No results found.  Medications: I have reviewed the patient's current medications.  Assessment/Plan: 1. Adenocarcinoma the sigmoid colon, status post a colonoscopic biopsy 08/07/2015 ? CT abdomen/pelvis 07/05/2015 with masslike thickening of the sigmoid colon, adjacent lymphadenopathy, and prominent para-aortic and porta hepatis nodes ? CT chest 08/28/2015 with bilateral pulmonary nodules suspicious for metastases ? PET scan 09/05/2015 with sigmoid colon mass SUV max 18.4; numerous lymph nodes in the sigmoid mesocolon most of which are too small for PET resolution; 8 mm right external iliac chain lymph node SUV max 5.2; hypermetabolic lymph nodes tracking superiorly along the retroperitoneum extending to the aortocaval station at the level of the renal arteries; index lymph node at the level of the aortic bifurcation measures 12 mm with an SUV max of 4.5; hepato duodenal lymph node measures 10  mm with SUV max 3.6; no abnormal hypermetabolism in the liver, adrenal glands, spleen or pancreas. A few scattered pulmonary nodules which do not show abnormal hypermetabolism, too small for PET resolution. ? 09/13/2015 status post laparoscopic sigmoidectomy and cystoscopy with bilateral stent placement ? Preserved expression of the major and minor MMR proteins. MSI-stable ? 7 cm adenocarcinoma of the sigmoid colon, grade 2; tumor invades through the colonic wall and adherent to the pelvic sidewall and anterior peritoneum; 28 benign lymph nodes ? CT biopsy of a hypermetabolic left retroperitoneal lymph node on 10/03/2015-pathology negative for malignancy ? Cycle 1 FOLFOX 10/11/2015 ? Cycle 2 FOLFOX 10/25/2015, Neulasta added ? Cycle 3 FOLFOX 11/08/2015 ? Cycle 4 FOLFOX 11/22/2015  2. History of Right lower abdominal pain secondary to #1  3. Mild neutropenia secondary to chemotherapy 10/25/2015. Neulasta added beginning with cycle 2.  4. Microcytic anemia. Question iron deficiency. He is taking iron  5. Thrombocytopenia secondary to chemotherapy   Disposition: [name] appears stable. He has completed 4 cycles of FOLFOX. He is scheduled to begin radiation/Xeloda 12/31/2015. We again reviewed potential toxicities associated with Xeloda including mouth sores, nausea, diarrhea, hand-foot syndrome, skin rash, conjunctivitis, increased sensitivity to sun. We also discussed rare possible cardiac side effects. He is agreeable to proceed.  He will return for a Port-A-Cath flush next week. We scheduled a follow-up visit approximately 2 weeks after beginning radiation/Xeloda. He will contact the office in the interim with any problems.  Patient seen with Dr. Benay Spice.    Ned Card ANP/GNP-BC   12/24/2015  10:26 AM  This was a shared visit with Ned Card. We discussed the planned  for Xeloda/radiation with [name]. He agrees to proceed. We will consider administering  additional FOLFOX at the completion of radiation.  Julieanne Manson, M.D.

## 2015-12-28 DIAGNOSIS — Z510 Encounter for antineoplastic radiation therapy: Secondary | ICD-10-CM | POA: Diagnosis not present

## 2015-12-28 NOTE — Progress Notes (Signed)
  Radiation Oncology         [phone]) [phone] ________________________________  Name: [facility] King MRN: [id number]  Date: 12/21/2015  DOB: 1975-06-15  Optical Surface Tracking Plan:  Since intensity modulated radiotherapy (IMRT) and 3D conformal radiation treatment methods are predicated on accurate and precise positioning for treatment, intrafraction motion monitoring is medically necessary to ensure accurate and safe treatment delivery.  The ability to quantify intrafraction motion without excessive ionizing radiation dose can only be performed with optical surface tracking. Accordingly, surface imaging offers the opportunity to obtain 3D measurements of patient position throughout IMRT and 3D treatments without excessive radiation exposure.  I am ordering optical surface tracking for this patient's upcoming course of radiotherapy. ________________________________  Kyung Rudd, MD 12/28/2015 8:04 AM    Reference:   Particia Jasper, et al. Surface imaging-based analysis of intrafraction motion for breast radiotherapy patients.Journal of [place], n. 6, nov. 2014. ISSN [id number].   Available at: <http://www.jacmp.org/index.php/jacmp/article/view/4957>.

## 2015-12-28 NOTE — Progress Notes (Signed)
  Radiation Oncology         [phone] ________________________________  Name: Marc King MRN: [id number]  Date: 12/21/2015  DOB: 1975-04-06   SIMULATION AND TREATMENT PLANNING NOTE  DIAGNOSIS:     ICD-9-CM ICD-10-CM   1. Cancer of sigmoid colon ([place]) 153.3 C18.7      The patient presented for simulation for the patient's upcoming course of radiation for the diagnosis of sigmoidcancer. The patient was placed in a supine position. A customized vac-lock bag was constructed to aid in patient immobilization on. This complex treatment device will be used on a daily basis during the treatment. In this fashion a CT scan was obtained through the pelvic region and the isocenter was placed near midline within the pelvis. Surface markings were placed.  The patient's imaging was loaded into the radiation treatment planning system. The patient will initially be planned to receive a course of radiation to a dose of 45 Gy. This will be accomplished in 25 fractions at 1.8 gray per fraction. This initial treatment will correspond to a 3-D conformal technique. The target has been contoured in addition to the rectum, bladder and femoral heads. Dose volume histograms of each of these structures have been requested and these will be carefully reviewed as part of the 3-D conformal treatment planning process. To accomplish this initial treatment, 4 customized blocks have been designed for this purpose. Each of these 4 complex treatment devices will be used on a daily basis during the initial course of the treatment. It is anticipated that the patient will then receive a boost for an additional 5.4 Gy. The anticipated total dose therefore will be 50.4 Gy.    Special treatment procedure The patient will receive chemotherapy during the course of radiation treatment. The patient may experience increased or overlapping toxicity due to this combined-modality approach and the patient will be monitored for such  problems. This may include extra lab work as necessary. This therefore constitutes a special treatment procedure.    ________________________________  Jodelle Gross, MD, PhD

## 2015-12-30 ENCOUNTER — Ambulatory Visit: Payer: PRIVATE HEALTH INSURANCE

## 2015-12-31 ENCOUNTER — Ambulatory Visit
Admission: RE | Admit: 2015-12-31 | Discharge: 2015-12-31 | Disposition: A | Discharge status: Home / Self Care | Payer: PRIVATE HEALTH INSURANCE | Source: Ambulatory Visit | Attending: Radiation Oncology | Admitting: Radiation Oncology

## 2015-12-31 ENCOUNTER — Ambulatory Visit: Payer: PRIVATE HEALTH INSURANCE

## 2015-12-31 DIAGNOSIS — Z510 Encounter for antineoplastic radiation therapy: Secondary | ICD-10-CM | POA: Diagnosis not present

## 2016-01-01 ENCOUNTER — Ambulatory Visit
Admission: RE | Admit: 2016-01-01 | Discharge: 2016-01-01 | Disposition: A | Discharge status: Home / Self Care | Payer: PRIVATE HEALTH INSURANCE | Source: Ambulatory Visit | Attending: Radiation Oncology | Admitting: Radiation Oncology

## 2016-01-01 DIAGNOSIS — Z510 Encounter for antineoplastic radiation therapy: Secondary | ICD-10-CM | POA: Diagnosis not present

## 2016-01-02 ENCOUNTER — Ambulatory Visit (HOSPITAL_BASED_OUTPATIENT_CLINIC_OR_DEPARTMENT_OTHER): Payer: PRIVATE HEALTH INSURANCE | Admitting: Genetic Counselor

## 2016-01-02 ENCOUNTER — Encounter: Payer: Self-pay | Admitting: Genetic Counselor

## 2016-01-02 ENCOUNTER — Other Ambulatory Visit: Payer: PRIVATE HEALTH INSURANCE

## 2016-01-02 ENCOUNTER — Ambulatory Visit
Admission: RE | Admit: 2016-01-02 | Discharge: 2016-01-02 | Disposition: A | Discharge status: Home / Self Care | Payer: PRIVATE HEALTH INSURANCE | Source: Ambulatory Visit | Attending: Radiation Oncology | Admitting: Radiation Oncology

## 2016-01-02 ENCOUNTER — Ambulatory Visit (HOSPITAL_BASED_OUTPATIENT_CLINIC_OR_DEPARTMENT_OTHER): Payer: PRIVATE HEALTH INSURANCE

## 2016-01-02 DIAGNOSIS — Z800 Family history of malignant neoplasm of digestive organs: Secondary | ICD-10-CM

## 2016-01-02 DIAGNOSIS — C187 Malignant neoplasm of sigmoid colon: Secondary | ICD-10-CM | POA: Diagnosis not present

## 2016-01-02 DIAGNOSIS — Z95828 Presence of other vascular implants and grafts: Secondary | ICD-10-CM

## 2016-01-02 DIAGNOSIS — Z315 Encounter for genetic counseling: Secondary | ICD-10-CM

## 2016-01-02 DIAGNOSIS — Z510 Encounter for antineoplastic radiation therapy: Secondary | ICD-10-CM | POA: Diagnosis not present

## 2016-01-02 DIAGNOSIS — Z452 Encounter for adjustment and management of vascular access device: Secondary | ICD-10-CM | POA: Diagnosis not present

## 2016-01-02 MED ORDER — SODIUM CHLORIDE 0.9% FLUSH
10.0000 mL | INTRAVENOUS | Status: DC | PRN
  Administered 2016-01-02: 10 mL via INTRAVENOUS
  Filled 2016-01-02: qty 10

## 2016-01-02 MED ORDER — HEPARIN SOD (PORK) LOCK FLUSH 100 UNIT/ML IV SOLN
500.0000 [IU] | Freq: Once | INTRAVENOUS | Status: AC
  Administered 2016-01-02: 500 [IU] via INTRAVENOUS
  Filled 2016-01-02: qty 5

## 2016-01-02 NOTE — Progress Notes (Signed)
REFERRING PROVIDER: Hayden Rasmussen, MD [place], [place] [address]   Betsy Coder, MD  PRIMARY PROVIDER:  Hayden Rasmussen., MD  PRIMARY REASON FOR VISIT:  1. Cancer of sigmoid colon ([place])   2. Family history of colon cancer      HISTORY OF PRESENT ILLNESS:   Marc King, a 40 y.o. male, was seen for a [place] cancer genetics consultation at the request of Dr. Darron Doom due to a personal and family history of cancer.  Marc King presents to clinic today to discuss the possibility of a hereditary predisposition to cancer, genetic testing, and to further clarify his future cancer risks, as well as potential cancer risks for family members.   In April 2017, at the age of 2, Marc King was diagnosed with cancer of the sigmoid colon. This was treated with partial colectomy, chemotherapy and radiation.  MSI was performed on the tumor and it was stable.     CANCER HISTORY:  Oncology History   Presented with RLQ abdominal pain for few months, occasional blood in stool     Cancer of sigmoid colon ([place])   07/05/2015 Imaging    CT ABD/PELVIS: 9 cm segment distal sigmoid abnomal. Prominent nodes paraaortic and portahepatis area      08/07/2015 Initial Diagnosis    Cancer of sigmoid colon ([place])      08/07/2015 Procedure    COLONOSCOPY: Fungating partially obstructing mass sigmoid colon 21-27 cm from anal verge; 6 cm long; circumferential      08/07/2015 Pathology Results    Adenocarcinoma      08/28/2015 Imaging    CT CHEST: Multiple bilateral pulmonary nodules in 2-5 mm range;8 mm nodule left infrahilar region;borderline enlarged right hilar lymph node      09/13/2015 Pathologic Stage    pT4b, pN0, pMx       09/13/2015 Pathology Results    IHC normal; Grade 2; 0/28 nodes +; Circumferential (radial) (posterior ascending, posterior descending; lateral and posterior mid-rectum; and entire lower 1/3 rectum):Positive Mesenteric margin (sigmoid and transverse): Positive         RISK FACTORS:  Colonoscopy: yes; abnormal. Prostate cancer screening: No Smoker: Former smoker for 10 years, quit 2006.  Smoked <1 ppd Alcohol: Social drinker in the past.  Quit in 2006.  Past Medical History:  Diagnosis Date  . Cancer of sigmoid colon ([place]) 08/07/2015  . Family history of colon cancer   . Scarlet fever     Past Surgical History:  Procedure Laterality Date  . CYSTOSCOPY WITH STENT PLACEMENT Bilateral 09/13/2015   Procedure: CYSTOSCOPY WITH BILATERAL STENT PLACEMENT;  Surgeon: Festus Aloe, MD;  Location: [facility];  Service: Urology;  Laterality: Bilateral;  . LAPAROSCOPIC PARTIAL COLECTOMY N/A 09/13/2015   Procedure: LAPAROSCOPIC SIGMOIDECTOMY;  Surgeon: Leighton Ruff, MD;  Location: [facility];  Service: General;  Laterality: N/A;  . PROCTOSCOPY  09/13/2015   Procedure: PROCTOSCOPY;  Surgeon: Leighton Ruff, MD;  Location: [facility];  Service: General;;  . VASECTOMY      Social History   Social History  . Marital status: Married    Spouse name: April  . Number of children: 2  . Years of education: N/A   Occupational History  . sales    Social History Main Topics  . Smoking status: Former Smoker    Types: Cigarettes    Quit date: 03/31/2004  . Smokeless tobacco: Never Used  . Alcohol use No     Comment: Quit in 2006  . Drug  use: No     Comment: hx of 2005 marijuana use none in 11 years   . Sexual activity: Yes   Other Topics Concern  . None   Social History Narrative   Married, wife April   #2 children-ages 8/10 (homeschool)   Works in Press photographer for [employer]     FAMILY HISTORY:  We obtained a detailed, 4-generation family history.  Significant diagnoses are listed below: Family History  Problem Relation Age of Onset  . Melanoma Father 75    on foot  . Clotting disorder Father   . Leukemia Mother 62    CLL  . Diabetes Mother   . Colitis Sister   . Irritable bowel syndrome Sister   . Celiac disease Sister   . Heart failure Maternal  Grandmother     during surgery  . Anuerysm Paternal Grandmother     brain  . Anuerysm Maternal Grandfather     stomach  . Colon cancer Cousin 19    paternal first cousin's child  . Colon cancer Other     Maternal great grandmother dx in her 50s    The patient has two children who are cancer free.  He has two sisters who have never had cancer.  His mother was diagnosed with CLL at 27.  It is currently in remission and has "shrunk" since the last time she was screened.  She has two full brothers and an adopted brother.  One full brother died of a lung disease.  The patients maternal grandparents are deceased from non cancer related issues. His grandmother's mother had colon cancer in her 3's.  His father had melanoma on his foot at 28. The patient's father had seven brothers and eight sisters, none of whom had cancer that the patient is aware of.  One aunt has a grandson who had colon cancer at 62.  Patient's maternal ancestors are of [place] descent, and paternal ancestors are of Caucasian descent. There is no reported Ashkenazi Jewish ancestry. There is no known consanguinity.  GENETIC COUNSELING ASSESSMENT: Marc King is a 40 y.o. male with a personal history of colon cancer and a family history of colon cancer which is somewhat suggestive of a hereditary cancer syndrome and predisposition to cancer. We, therefore, discussed and recommended the following at today's visit.   DISCUSSION: We discussed that about 5% of colon cancer is hereditary with most cases due to Lynch syndrome.  The patient has tumor testing and the tumor was MSI-stable, therefore reducing the chance that the patient has Lynch syndrome.  Based on the large family, we discussed MUTYH and recessive inheritance.  We reviewed the characteristics, features and inheritance patterns of hereditary cancer syndromes. We also discussed genetic testing, including the appropriate family members to test, the process of testing, insurance  coverage and turn-around-time for results. We discussed the implications of a negative, positive and/or variant of uncertain significant result. We recommended Marc King pursue genetic testing for the Colorectal cancer gene panel and MSH2 inversion testing.The Colorectal Cancer Panel offered by [facility] includes sequencing and/or duplication/deletion testing of the following 19 genes: APC, ATM, AXIN2, BMPR1A, CDH1, CHEK2, EPCAM, MLH1, MSH2, MSH6, MUTYH, PMS2, POLD1, POLE, PTEN, SCG5/GREM1, SMAD4, STK11, and TP53.    Based on Marc King personal and family history of cancer, he meets medical criteria for genetic testing. Despite that he meets criteria, he may still have an out of pocket cost. We discussed that if his out of pocket cost for testing is over $  100, the laboratory will call and confirm whether he wants to proceed with testing.  If the out of pocket cost of testing is less than $100 he will be billed by the genetic testing laboratory.   PLAN: After considering the risks, benefits, and limitations, Marc King  provided informed consent to pursue genetic testing and the blood sample was sent to [facility] for analysis of the Colorectal cancer panel. Results should be available within approximately 2-3 weeks' time, at which point they will be disclosed by telephone to Marc King, as will any additional recommendations warranted by these results. Marc King will receive a summary of his genetic counseling visit and a copy of his results once available. This information will also be available in Epic. We encouraged Marc King to remain in contact with cancer genetics annually so that we can continuously update the family history and inform him of any changes in cancer genetics and testing that may be of benefit for his family. Marc King questions were answered to his satisfaction today. Our contact information was provided should additional questions or concerns arise.  Lastly, we encouraged Mr.  King to remain in contact with cancer genetics annually so that we can continuously update the family history and inform him of any changes in cancer genetics and testing that may be of benefit for this family.   Mr.  King questions were answered to his satisfaction today. Our contact information was provided should additional questions or concerns arise. Thank you for the referral and allowing [place] to share in the care of your patient.   Marc King P. Florene Glen, [place], [place] Certified Genetic Counselor [email or web address] phone: [phone]  The patient was seen for a total of 45 minutes in face-to-face genetic counseling.  This patient was discussed with Drs. Magrinat, Lindi Adie and/or Burr Medico who agrees with the above.    _______________________________________________________________________ For Office Staff:  Number of people involved in session: 1 Was an Intern/ student involved with case: no

## 2016-01-03 ENCOUNTER — Ambulatory Visit
Admission: RE | Admit: 2016-01-03 | Discharge: 2016-01-03 | Disposition: A | Discharge status: Home / Self Care | Payer: PRIVATE HEALTH INSURANCE | Source: Ambulatory Visit | Attending: Radiation Oncology | Admitting: Radiation Oncology

## 2016-01-03 ENCOUNTER — Ambulatory Visit: Payer: PRIVATE HEALTH INSURANCE

## 2016-01-03 ENCOUNTER — Encounter: Payer: Self-pay | Admitting: Radiation Oncology

## 2016-01-03 VITALS — BP 136/93 | HR 73 | Temp 98.2°F | Resp 20 | Wt 238.0 lb

## 2016-01-03 DIAGNOSIS — C187 Malignant neoplasm of sigmoid colon: Secondary | ICD-10-CM

## 2016-01-03 DIAGNOSIS — Z510 Encounter for antineoplastic radiation therapy: Secondary | ICD-10-CM | POA: Diagnosis not present

## 2016-01-03 NOTE — Progress Notes (Addendum)
Weekly rad tx colon 3/28 completed, pt education done, Radiation therapy and you book given,my business card, , patient taking xeloda bid,no c/opain or loose bowels at present, discussed ways to manage side effects, nausea,vomiting,diarhhea, ,skin irritation, pain, fatigue, bladder changes,  Low fiber diet and imodium prn for diarrhea, may need to eat 5-6 smaller meals and snacks in between, compazine for nausea, increase proteinn in diet, stay away from spicy greasy foods, , use of babay wipes as needed, sitz bath plained and offered, teach back given 8:26 AM BP (!) 136/93 (BP Location: Right Arm, Patient Position: Sitting, Cuff Size: Normal)   Pulse 73   Temp 98.2 F (36.8 C) (Oral)   Resp 20   Wt 238 lb (108 kg)   BMI 33.19 kg/m   Wt Readings from Last 3 Encounters:  01/03/16 238 lb (108 kg)  12/24/15 234 lb 9.6 oz (106.4 kg)  11/22/15 230 lb 11.2 oz (104.6 kg)

## 2016-01-03 NOTE — Progress Notes (Signed)
Department of Radiation Oncology  Phone:  [phone] Fax:        [phone]  Weekly Treatment Note    Name: Marc King Date: 01/03/2016 MRN: [id number] DOB: [birth year]   Diagnosis:     ICD-9-CM ICD-10-CM   1. Cancer of sigmoid colon ([place]) 153.3 C18.7      Current dose: 7.2 Gy  Current fraction: 4   MEDICATIONS: Current Outpatient Prescriptions  Medication Sig Dispense Refill  . acetaminophen (TYLENOL) 500 MG tablet Take 1,000 mg by mouth every 6 (six) hours as needed (For pain.).     Marland Kitchen capecitabine (XELODA) 500 MG tablet Take 4 tabs (2,000 mg) QAM and 3 tabs (1,500 mg) daily on days of radiation only. (Mon-Fri) 196 tablet   . ibuprofen (ADVIL,MOTRIN) 200 MG tablet Take 200-400 mg by mouth every 6 (six) hours as needed (For pain.).    Marland Kitchen lidocaine-prilocaine (EMLA) cream Apply to port site one hour prior to use. Do not rub in, cover with plastic 30 g 1  . polyethylene glycol (MIRALAX / GLYCOLAX) packet Take 17 g by mouth daily as needed.    Marland Kitchen oxyCODONE (OXY IR/ROXICODONE) 5 MG immediate release tablet Take 1-2 tablets (5-10 mg total) by mouth every 4 (four) hours as needed for moderate pain. (Patient not taking: Reported on 01/03/2016) 40 tablet 0  . prochlorperazine (COMPAZINE) 10 MG tablet Take 1 tablet (10 mg total) by mouth every 6 (six) hours as needed for nausea or vomiting. (Patient not taking: Reported on 01/03/2016) 30 tablet 1   No current facility-administered medications for this encounter.      ALLERGIES: Review of patient's allergies indicates no known allergies.   LABORATORY DATA:  Lab Results  Component Value Date   WBC 5.9 12/24/2015   HGB 15.6 12/24/2015   HCT 47.2 12/24/2015   MCV 83.0 12/24/2015   PLT 174 12/24/2015   Lab Results  Component Value Date   NA 138 12/24/2015   K 4.9 12/24/2015   CL 105 10/03/2015   CO2 25 12/24/2015   Lab Results  Component Value Date   ALT 30 12/24/2015   AST 28 12/24/2015   ALKPHOS 56 12/24/2015   BILITOT 0.71 12/24/2015     NARRATIVE: Marc King was seen today for weekly treatment management. The chart was checked and the patient's films were reviewed.  Weekly rad tx colon 3/28 completed, pt education done, Radiation therapy and you book given,my business card, , patient taking xeloda bid,no c/opain or loose bowels at present, discussed ways to manage side effects, nausea,vomiting,diarhhea, ,skin irritation, pain, fatigue, bladder changes,  Low fiber diet and imodium prn for diarrhea, may need to eat 5-6 smaller meals and snacks in between, compazine for nausea, increase proteinn in diet, stay away from spicy greasy foods, , use of babay wipes as needed, sitz bath plained and offered, teach back given 9:19 AM BP (!) 136/93 (BP Location: Right Arm, Patient Position: Sitting, Cuff Size: Normal)   Pulse 73   Temp 98.2 F (36.8 C) (Oral)   Resp 20   Wt 238 lb (108 kg)   BMI 33.19 kg/m   Wt Readings from Last 3 Encounters:  01/03/16 238 lb (108 kg)  12/24/15 234 lb 9.6 oz (106.4 kg)  11/22/15 230 lb 11.2 oz (104.6 kg)    PHYSICAL EXAMINATION: weight is 238 lb (108 kg). His oral temperature is 98.2 F (36.8 C). His blood pressure is 136/93 (abnormal) and his pulse is 73. His respiration is 20.  ASSESSMENT: The patient is doing satisfactorily with treatment.  PLAN: We will continue with the patient's radiation treatment as planned.

## 2016-01-04 ENCOUNTER — Ambulatory Visit
Admission: RE | Admit: 2016-01-04 | Discharge: 2016-01-04 | Disposition: A | Discharge status: Home / Self Care | Payer: PRIVATE HEALTH INSURANCE | Source: Ambulatory Visit | Attending: Radiation Oncology | Admitting: Radiation Oncology

## 2016-01-04 ENCOUNTER — Encounter: Payer: Self-pay | Admitting: Radiation Oncology

## 2016-01-04 DIAGNOSIS — Z510 Encounter for antineoplastic radiation therapy: Secondary | ICD-10-CM | POA: Diagnosis not present

## 2016-01-07 ENCOUNTER — Ambulatory Visit
Admission: RE | Admit: 2016-01-07 | Discharge: 2016-01-07 | Disposition: A | Discharge status: Home / Self Care | Payer: PRIVATE HEALTH INSURANCE | Source: Ambulatory Visit | Attending: Radiation Oncology | Admitting: Radiation Oncology

## 2016-01-07 DIAGNOSIS — Z510 Encounter for antineoplastic radiation therapy: Secondary | ICD-10-CM | POA: Diagnosis not present

## 2016-01-08 ENCOUNTER — Ambulatory Visit
Admission: RE | Admit: 2016-01-08 | Discharge: 2016-01-08 | Disposition: A | Discharge status: Home / Self Care | Payer: PRIVATE HEALTH INSURANCE | Source: Ambulatory Visit | Attending: Radiation Oncology | Admitting: Radiation Oncology

## 2016-01-08 DIAGNOSIS — Z510 Encounter for antineoplastic radiation therapy: Secondary | ICD-10-CM | POA: Diagnosis not present

## 2016-01-09 ENCOUNTER — Ambulatory Visit
Admission: RE | Admit: 2016-01-09 | Discharge: 2016-01-09 | Disposition: A | Discharge status: Home / Self Care | Payer: PRIVATE HEALTH INSURANCE | Source: Ambulatory Visit | Attending: Radiation Oncology | Admitting: Radiation Oncology

## 2016-01-09 DIAGNOSIS — Z510 Encounter for antineoplastic radiation therapy: Secondary | ICD-10-CM | POA: Diagnosis not present

## 2016-01-09 NOTE — Addendum Note (Signed)
Encounter addended by: Kyung Rudd, MD on: 01/09/2016 10:28 AM<BR>    Actions taken: Edit attestation on clinical note

## 2016-01-10 ENCOUNTER — Ambulatory Visit
Admission: RE | Admit: 2016-01-10 | Discharge: 2016-01-10 | Disposition: A | Discharge status: Home / Self Care | Payer: PRIVATE HEALTH INSURANCE | Source: Ambulatory Visit | Attending: Radiation Oncology | Admitting: Radiation Oncology

## 2016-01-10 ENCOUNTER — Encounter: Payer: Self-pay | Admitting: Pharmacist

## 2016-01-10 VITALS — Wt 240.0 lb

## 2016-01-10 DIAGNOSIS — C187 Malignant neoplasm of sigmoid colon: Secondary | ICD-10-CM

## 2016-01-10 DIAGNOSIS — Z510 Encounter for antineoplastic radiation therapy: Secondary | ICD-10-CM | POA: Diagnosis not present

## 2016-01-10 NOTE — Progress Notes (Signed)
   Department of Radiation Oncology  Phone:  [phone] Fax:        [phone]  Weekly Treatment Note    Name: Marc King Date: 01/10/2016 MRN: [id number] DOB: [birth year]   Diagnosis:     ICD-9-CM ICD-10-CM   1. Cancer of sigmoid colon ([place]) 153.3 C18.7      Current dose: 16.2 Gy  Current fraction: 9   MEDICATIONS: Current Outpatient Prescriptions  Medication Sig Dispense Refill  . acetaminophen (TYLENOL) 500 MG tablet Take 1,000 mg by mouth every 6 (six) hours as needed (For pain.).     Marland Kitchen capecitabine (XELODA) 500 MG tablet Take 4 tabs (2,000 mg) QAM and 3 tabs (1,500 mg) daily on days of radiation only. (Mon-Fri) 196 tablet   . ibuprofen (ADVIL,MOTRIN) 200 MG tablet Take 200-400 mg by mouth every 6 (six) hours as needed (For pain.).    Marland Kitchen lidocaine-prilocaine (EMLA) cream Apply to port site one hour prior to use. Do not rub in, cover with plastic 30 g 1  . oxyCODONE (OXY IR/ROXICODONE) 5 MG immediate release tablet Take 1-2 tablets (5-10 mg total) by mouth every 4 (four) hours as needed for moderate pain. (Patient not taking: Reported on 01/03/2016) 40 tablet 0  . polyethylene glycol (MIRALAX / GLYCOLAX) packet Take 17 g by mouth daily as needed.    . prochlorperazine (COMPAZINE) 10 MG tablet Take 1 tablet (10 mg total) by mouth every 6 (six) hours as needed for nausea or vomiting. (Patient not taking: Reported on 01/03/2016) 30 tablet 1   No current facility-administered medications for this encounter.      ALLERGIES: Review of patient's allergies indicates no known allergies.   LABORATORY DATA:  Lab Results  Component Value Date   WBC 5.9 12/24/2015   HGB 15.6 12/24/2015   HCT 47.2 12/24/2015   MCV 83.0 12/24/2015   PLT 174 12/24/2015   Lab Results  Component Value Date   NA 138 12/24/2015   K 4.9 12/24/2015   CL 105 10/03/2015   CO2 25 12/24/2015   Lab Results  Component Value Date   ALT 30 12/24/2015   AST 28 12/24/2015   ALKPHOS 56 12/24/2015     BILITOT 0.71 12/24/2015     NARRATIVE: Marc King was seen today for weekly treatment management. The chart was checked and the patient's films were reviewed.  The patient states that he is doing well today. He had what he felt like was a bladder spasm several days ago when going to the bathroom. This has not continued. No significant loose stools or diarrhea. Nausea.   PHYSICAL EXAMINATION: weight is 240 lb (108.9 kg).      Alert, no acute distress  ASSESSMENT: The patient is doing satisfactorily with treatment.  PLAN: We will continue with the patient's radiation treatment as planned.

## 2016-01-10 NOTE — Progress Notes (Signed)
Oral Chemotherapy Pharmacist Encounter  Received notification of refill request from [facility] for patient's Xeloda. I called [facility] to inquire about number of tablets dispensed with last fill as patient will only require Xeloda on days of radiation, and will stop Xeloda at end of radiation course. Per [facility], on 12/13/15, they dispensed 210 Xeloda 500mg  tablets, which was a 42 day supply. This is enough medication for the full course of radiation. I told [facility] that the patient would not require further refills.  [name], PharmD, BCPS 01/10/2016  9:28 AM Oral Chemotherapy Clinic [phone]

## 2016-01-11 ENCOUNTER — Ambulatory Visit
Admission: RE | Admit: 2016-01-11 | Discharge: 2016-01-11 | Disposition: A | Discharge status: Home / Self Care | Payer: PRIVATE HEALTH INSURANCE | Source: Ambulatory Visit | Attending: Radiation Oncology | Admitting: Radiation Oncology

## 2016-01-11 DIAGNOSIS — Z510 Encounter for antineoplastic radiation therapy: Secondary | ICD-10-CM | POA: Diagnosis not present

## 2016-01-14 ENCOUNTER — Ambulatory Visit
Admission: RE | Admit: 2016-01-14 | Discharge: 2016-01-14 | Disposition: A | Discharge status: Home / Self Care | Payer: PRIVATE HEALTH INSURANCE | Source: Ambulatory Visit | Attending: Radiation Oncology | Admitting: Radiation Oncology

## 2016-01-14 DIAGNOSIS — Z510 Encounter for antineoplastic radiation therapy: Secondary | ICD-10-CM | POA: Diagnosis not present

## 2016-01-15 ENCOUNTER — Ambulatory Visit
Admission: RE | Admit: 2016-01-15 | Discharge: 2016-01-15 | Disposition: A | Discharge status: Home / Self Care | Payer: PRIVATE HEALTH INSURANCE | Source: Ambulatory Visit | Attending: Radiation Oncology | Admitting: Radiation Oncology

## 2016-01-15 ENCOUNTER — Ambulatory Visit (HOSPITAL_BASED_OUTPATIENT_CLINIC_OR_DEPARTMENT_OTHER): Payer: PRIVATE HEALTH INSURANCE | Admitting: Oncology

## 2016-01-15 ENCOUNTER — Encounter: Payer: Self-pay | Admitting: Radiation Oncology

## 2016-01-15 ENCOUNTER — Other Ambulatory Visit (HOSPITAL_BASED_OUTPATIENT_CLINIC_OR_DEPARTMENT_OTHER): Payer: PRIVATE HEALTH INSURANCE

## 2016-01-15 VITALS — BP 138/77 | HR 77 | Temp 97.9°F | Resp 18 | Ht 71.0 in | Wt 241.2 lb

## 2016-01-15 DIAGNOSIS — Z510 Encounter for antineoplastic radiation therapy: Secondary | ICD-10-CM | POA: Diagnosis not present

## 2016-01-15 DIAGNOSIS — C187 Malignant neoplasm of sigmoid colon: Secondary | ICD-10-CM | POA: Diagnosis not present

## 2016-01-15 LAB — COMPREHENSIVE METABOLIC PANEL
ALT: 19 U/L (ref 0–55)
ANION GAP: 10 meq/L (ref 3–11)
AST: 23 U/L (ref 5–34)
Albumin: 3.6 g/dL (ref 3.5–5.0)
Alkaline Phosphatase: 45 U/L (ref 40–150)
BUN: 10.6 mg/dL (ref 7.0–26.0)
CHLORIDE: 104 meq/L (ref 98–109)
CO2: 23 meq/L (ref 22–29)
CREATININE: 0.9 mg/dL (ref 0.7–1.3)
Calcium: 9.3 mg/dL (ref 8.4–10.4)
EGFR: >90 mL/min/{1.73_m2} (ref >90)
Glucose: 187 mg/dl — ABNORMAL HIGH (ref 70–140)
Potassium: 4.7 mEq/L (ref 3.5–5.1)
Sodium: 137 mEq/L (ref 136–145)
Total Bilirubin: 0.78 mg/dL (ref 0.20–1.20)
Total Protein: 7.1 g/dL (ref 6.4–8.3)

## 2016-01-15 LAB — CBC WITH DIFFERENTIAL/PLATELET
BASO%: 0.5 % (ref 0.0–2.0)
Basophils Absolute: 0 10*3/uL (ref 0.0–0.1)
EOS%: 2.6 % (ref 0.0–7.0)
Eosinophils Absolute: 0.1 10*3/uL (ref 0.0–0.5)
HCT: 42.7 % (ref 38.4–49.9)
HGB: 14.6 g/dL (ref 13.0–17.1)
LYMPH%: 14.9 % (ref 14.0–49.0)
MCH: 29.1 pg (ref 27.2–33.4)
MCHC: 34.2 g/dL (ref 32.0–36.0)
MCV: 85 fL (ref 79.3–98.0)
MONO#: 0.5 10*3/uL (ref 0.1–0.9)
MONO%: 11.4 % (ref 0.0–14.0)
NEUT#: 3.2 10*3/uL (ref 1.5–6.5)
NEUT%: 70.6 % (ref 39.0–75.0)
Platelets: 164 10*3/uL (ref 140–400)
RBC: 5.03 10*6/uL (ref 4.20–5.82)
RDW: 19.8 % — ABNORMAL HIGH (ref 11.0–14.6)
WBC: 4.5 10*3/uL (ref 4.0–10.3)
lymph#: 0.7 10*3/uL — ABNORMAL LOW (ref 0.9–3.3)

## 2016-01-15 NOTE — Progress Notes (Signed)
  [facility] OFFICE PROGRESS NOTE   Diagnosis: Colon cancer  INTERVAL HISTORY:   Marc King returns as scheduled. He began concurrent Xeloda and radiation on 12/31/2015. He reports increased stool frequency without frank diarrhea. He has a bowel movement up to 8 times per day. The stool is small in volume. No hand or foot pain. No mouth sores. Mild nausea. He is working.  Objective:  Vital signs in last 24 hours:  Blood pressure 138/77, pulse 77, temperature 97.9 F (36.6 C), temperature source Oral, resp. rate 18, height _0  (1.803 m), weight 241 lb 3.2 oz (109.4 kg), SpO2 100 %.    HEENT: No thrush or ulcers Resp: Lungs clear bilaterally Cardio: Regular rate and rhythm GI: No hepatomegaly, nontender Vascular: No leg edema  Skin: Sunburn at the lower posterior neck, dry flaking at the soles, dryness of the palms with a single linear ulcer at one of the fingers   Portacath/with mild surrounding erythema  Lab Results:  Lab Results  Component Value Date   WBC 4.5 01/15/2016   HGB 14.6 01/15/2016   HCT 42.7 01/15/2016   MCV 85.0 01/15/2016   PLT 164 01/15/2016   NEUTROABS 3.2 01/15/2016    Medications: I have reviewed the patient's current medications.  Assessment/Plan: 1. Adenocarcinoma the sigmoid colon, status post a colonoscopic biopsy 08/07/2015 ? CT abdomen/pelvis 07/05/2015 with masslike thickening of the sigmoid colon, adjacent lymphadenopathy, and prominent para-aortic and porta hepatis nodes ? CT chest 08/28/2015 with bilateral pulmonary nodules suspicious for metastases ? PET scan 09/05/2015 with sigmoid colon mass SUV max 18.4; numerous lymph nodes in the sigmoid mesocolon most of which are too small for PET resolution; 8 mm right external iliac chain lymph node SUV max 5.2; hypermetabolic lymph nodes tracking superiorly along the retroperitoneum extending to the aortocaval station at the level of the renal arteries; index lymph node at the level  of the aortic bifurcation measures 12 mm with an SUV max of 4.5; hepato duodenal lymph node measures 10 mm with SUV max 3.6; no abnormal hypermetabolism in the liver, adrenal glands, spleen or pancreas. A few scattered pulmonary nodules which do not show abnormal hypermetabolism, too small for PET resolution. ? 09/13/2015 status post laparoscopic sigmoidectomy and cystoscopy with bilateral stent placement ? Preserved expression of the major and minor MMR proteins. MSI-stable ? 7 cm adenocarcinoma of the sigmoid colon, grade 2; tumor invades through the colonic wall and adherent to the pelvic sidewall and anterior peritoneum; 28 benign lymph nodes ? CT biopsy of a hypermetabolic left retroperitoneal lymph node on 10/03/2015-pathology negative for malignancy ? Cycle 1 FOLFOX 10/11/2015 ? Cycle 2 FOLFOX 10/25/2015, Neulasta added ? Cycle 3 FOLFOX 11/08/2015 ? Cycle 4 FOLFOX 11/22/2015 ? Xeloda/radiation starting 12/31/2015  2. History of Right lower abdominal pain secondary to #1  3. Mild neutropenia secondary to chemotherapy 10/25/2015. Neulasta added beginning with cycle 2.  4. Microcytic anemia. Question iron deficiency. He is taking iron  5. History of Thrombocytopenia secondary to chemotherapy   Disposition:  Mr. Patras appears to be tolerating the radiation and Xeloda well. He will contact us for diarrhea or hand/foot pain. He will return for an office and lab visit in 2 weeks. I recommended he use sunscreen and avoid heavy sun exposure. We will plan for additional FOLFOX at the completion of radiation. He will contact us for increased erythema or pain at the Port-A-Cath site.  Betsy Coder, MD  01/15/2016  9:11 AM

## 2016-01-16 ENCOUNTER — Ambulatory Visit
Admission: RE | Admit: 2016-01-16 | Discharge: 2016-01-16 | Disposition: A | Discharge status: Home / Self Care | Payer: PRIVATE HEALTH INSURANCE | Source: Ambulatory Visit | Attending: Radiation Oncology | Admitting: Radiation Oncology

## 2016-01-16 ENCOUNTER — Encounter: Payer: Self-pay | Admitting: Radiation Oncology

## 2016-01-16 VITALS — BP 143/92 | HR 80 | Temp 98.6°F | Resp 16 | Wt 239.2 lb

## 2016-01-16 DIAGNOSIS — Z510 Encounter for antineoplastic radiation therapy: Secondary | ICD-10-CM | POA: Diagnosis not present

## 2016-01-16 DIAGNOSIS — C187 Malignant neoplasm of sigmoid colon: Secondary | ICD-10-CM | POA: Insufficient documentation

## 2016-01-16 NOTE — Progress Notes (Addendum)
Weekly rad txs rectal/pelvis,13/28 completed, Having loose stools, 8 day at [facility],  Abdominal gas , and had nausea  On Saturday, is changing his diet, some irritation to his bottom, using baby wipes,  Appetite good  , taking xeloda bid, energy good 8:07 AM BP (!) 143/92 (BP Location: Right Arm, Patient Position: Sitting, Cuff Size: Normal)   Pulse 80   Temp 98.6 F (37 C) (Oral)   Resp 16   Wt 239 lb 3.2 oz (108.5 kg)   BMI 33.36 kg/m   Wt Readings from Last 3 Encounters:  01/16/16 239 lb 3.2 oz (108.5 kg)  01/15/16 241 lb 3.2 oz (109.4 kg)  01/10/16 240 lb (108.9 kg)

## 2016-01-16 NOTE — Progress Notes (Signed)
   Department of Radiation Oncology  Phone:  [phone] Fax:        [phone]  Weekly Treatment Note    Name: Marc King Date: 01/16/2016 MRN: [id number] DOB: Mar 03, 1976   Diagnosis:     ICD-9-CM ICD-10-CM   1. Cancer of sigmoid colon ([place]) 153.3 C18.7      Current dose: 23.4 Gy  Current fraction: 13   MEDICATIONS: Current Outpatient Prescriptions  Medication Sig Dispense Refill  . acetaminophen (TYLENOL) 500 MG tablet Take 1,000 mg by mouth every 6 (six) hours as needed (For pain.).     Marland Kitchen capecitabine (XELODA) 500 MG tablet Take 4 tabs (2,000 mg) QAM and 3 tabs (1,500 mg) daily on days of radiation only. (Mon-Fri) 196 tablet   . ibuprofen (ADVIL,MOTRIN) 200 MG tablet Take 200-400 mg by mouth every 6 (six) hours as needed (For pain.).    Marland Kitchen lidocaine-prilocaine (EMLA) cream Apply to port site one hour prior to use. Do not rub in, cover with plastic 30 g 1  . oxyCODONE (OXY IR/ROXICODONE) 5 MG immediate release tablet Take 1-2 tablets (5-10 mg total) by mouth every 4 (four) hours as needed for moderate pain. (Patient not taking: Reported on 01/15/2016) 40 tablet 0  . polyethylene glycol (MIRALAX / GLYCOLAX) packet Take 17 g by mouth daily as needed.    . prochlorperazine (COMPAZINE) 10 MG tablet Take 1 tablet (10 mg total) by mouth every 6 (six) hours as needed for nausea or vomiting. (Patient not taking: Reported on 01/15/2016) 30 tablet 1   No current facility-administered medications for this encounter.      ALLERGIES: Review of patient's allergies indicates no known allergies.   LABORATORY DATA:  Lab Results  Component Value Date   WBC 4.5 01/15/2016   HGB 14.6 01/15/2016   HCT 42.7 01/15/2016   MCV 85.0 01/15/2016   PLT 164 01/15/2016   Lab Results  Component Value Date   NA 137 01/15/2016   K 4.7 01/15/2016   CL 105 10/03/2015   CO2 23 01/15/2016   Lab Results  Component Value Date   ALT 19 01/15/2016   AST 23 01/15/2016   ALKPHOS 45  01/15/2016   BILITOT 0.78 01/15/2016     NARRATIVE: Marc King was seen today for weekly treatment management. The chart was checked and the patient's films were reviewed.  Weekly rad txs rectal/pelvis,13/28 completed. Reports loose stools 8 times one day, abdominal gas, and nausea on Saturday. He is changing his diet. He has some irritation to his bottom, using baby wipes. Good appetite, taking xeloda bid, good energy. He is not taking Immodium yet.  PHYSICAL EXAMINATION: weight is 239 lb 3.2 oz (108.5 kg). His oral temperature is 98.6 F (37 C). His blood pressure is 143/92 (abnormal) and his pulse is 80. His respiration is 16.  Alert, no acute distress  ASSESSMENT: The patient is doing satisfactorily with treatment. His symptoms appear to be related to diet. We discussed supportive OTC medications.  PLAN: We will continue with the patient's radiation treatment as planned.  ------------------------------------------------  Jodelle Gross, MD, PhD   This document serves as a record of services personally performed by Kyung Rudd, MD. It was created on their behalf by Darcus Austin, a trained medical scribe. The creation of this record is based on the scribe's personal observations and the providers' statements to them. This document has been checked and approved by the attending provider.

## 2016-01-17 ENCOUNTER — Ambulatory Visit
Admission: RE | Admit: 2016-01-17 | Discharge: 2016-01-17 | Disposition: A | Discharge status: Home / Self Care | Payer: PRIVATE HEALTH INSURANCE | Source: Ambulatory Visit | Attending: Radiation Oncology | Admitting: Radiation Oncology

## 2016-01-17 DIAGNOSIS — Z510 Encounter for antineoplastic radiation therapy: Secondary | ICD-10-CM | POA: Diagnosis not present

## 2016-01-18 ENCOUNTER — Ambulatory Visit
Admission: RE | Admit: 2016-01-18 | Discharge: 2016-01-18 | Disposition: A | Discharge status: Home / Self Care | Payer: PRIVATE HEALTH INSURANCE | Source: Ambulatory Visit | Attending: Radiation Oncology | Admitting: Radiation Oncology

## 2016-01-18 ENCOUNTER — Encounter: Payer: Self-pay | Admitting: Radiation Oncology

## 2016-01-18 ENCOUNTER — Encounter: Payer: Self-pay | Admitting: Genetic Counselor

## 2016-01-18 ENCOUNTER — Telehealth: Payer: Self-pay | Admitting: Genetic Counselor

## 2016-01-18 ENCOUNTER — Ambulatory Visit: Payer: Self-pay | Admitting: Genetic Counselor

## 2016-01-18 DIAGNOSIS — Z800 Family history of malignant neoplasm of digestive organs: Secondary | ICD-10-CM

## 2016-01-18 DIAGNOSIS — Z1379 Encounter for other screening for genetic and chromosomal anomalies: Secondary | ICD-10-CM | POA: Insufficient documentation

## 2016-01-18 DIAGNOSIS — Z510 Encounter for antineoplastic radiation therapy: Secondary | ICD-10-CM | POA: Diagnosis not present

## 2016-01-18 DIAGNOSIS — C187 Malignant neoplasm of sigmoid colon: Secondary | ICD-10-CM

## 2016-01-18 NOTE — Progress Notes (Signed)
HPI: Mr. Cure was previously seen in the [facility] due to a personal and family history of cancer and concerns regarding a hereditary predisposition to cancer. Please refer to our prior cancer genetics clinic note for more information regarding Mr. Shomaker medical, social and family histories, and our assessment and recommendations, at the time. Mr. Levario recent genetic test results were disclosed to him, as were recommendations warranted by these results. These results and recommendations are discussed in more detail below.  FAMILY HISTORY:  We obtained a detailed, 4-generation family history.  Significant diagnoses are listed below: Family History  Problem Relation Age of Onset  . Melanoma Father 31    on foot  . Clotting disorder Father   . Leukemia Mother 34    CLL  . Diabetes Mother   . Colitis Sister   . Irritable bowel syndrome Sister   . Celiac disease Sister   . Heart failure Maternal Grandmother     during surgery  . Anuerysm Paternal Grandmother     brain  . Anuerysm Maternal Grandfather     stomach  . Colon cancer Cousin 19    paternal first cousin's child  . Colon cancer Other     Maternal great grandmother dx in her 66s    The patient has two children who are cancer free.  He has two sisters who have never had cancer.  His mother was diagnosed with CLL at 32.  It is currently in remission and has "shrunk" since the last time she was screened.  She has two full brothers and an adopted brother.  One full brother died of a lung disease.  The patients maternal grandparents are deceased from non cancer related issues. His grandmother's mother had colon cancer in her 63's.  His father had melanoma on his foot at 66. The patient's father had seven brothers and eight sisters, none of whom had cancer that the patient is aware of.  One aunt has a grandson who had colon cancer at 37.  Patient's maternal ancestors are of [place] descent, and paternal ancestors  are of Caucasian descent. There is no reported Ashkenazi Jewish ancestry. There is no known consanguinity.  GENETIC TEST RESULTS: At the time of Mr. Clugston visit, we recommended he pursue genetic testing of the colorectal cancer gene panel with MSH2 inversion analysis. The Colorectal Cancer Panel offered by [facility] includes sequencing and/or duplication/deletion testing of the following 19 genes: APC, ATM, AXIN2, BMPR1A, CDH1, CHEK2, EPCAM, MLH1, MSH2, MSH6, MUTYH, PMS2, POLD1, POLE, PTEN, SCG5/GREM1, SMAD4, STK11, and TP53.  The report date is January 17, 2016.  Genetic testing was normal, and did not reveal a deleterious mutation in these genes. The test report has been scanned into EPIC and is located under the Molecular Pathology section of the Results Review tab.   We discussed with Mr. Hyson that since the current genetic testing is not perfect, it is possible there may be a gene mutation in one of these genes that current testing cannot detect, but that chance is small. We also discussed, that it is possible that another gene that has not yet been discovered, or that we have not yet tested, is responsible for the cancer diagnoses in the family, and it is, therefore, important to remain in touch with cancer genetics in the future so that we can continue to offer Mr. Santilli the most up to date genetic testing.   CANCER SCREENING RECOMMENDATIONS: This result is reassuring and indicates that Mr.  Navis likely does not have an increased risk for a future cancer due to a mutation in one of these genes. This normal test also suggests that Mr. Mccamish cancer was most likely not due to an inherited predisposition associated with one of these genes.  Most cancers happen by chance and this negative test suggests that his cancer falls into this category.  We, therefore, recommended he continue to follow the cancer management and screening guidelines provided by his oncology and primary healthcare provider.    RECOMMENDATIONS FOR FAMILY MEMBERS:  We recommended women in this family have a yearly mammogram beginning at age 62, or 44 years younger than the earliest onset of cancer, an annual clinical breast exam, and perform monthly breast self-exams. Women in this family should also have a gynecological exam as recommended by their primary provider. All family members should have a colonoscopy by age 50, 45 years younger than Mr. Perrier diagnosis.  FOLLOW-UP: Lastly, we discussed with Mr. Pilar that cancer genetics is a rapidly advancing field and it is possible that new genetic tests will be appropriate for him and/or his family members in the future. We encouraged him to remain in contact with cancer genetics on an annual basis so we can update his personal and family histories and let him know of advances in cancer genetics that may benefit this family.   Our contact number was provided. Mr. Brafford questions were answered to his satisfaction, and he knows he is welcome to call us at anytime with additional questions or concerns.   Roma Kayser, MS, [facility] Certified Genetic Counselor [email or web address]

## 2016-01-18 NOTE — Telephone Encounter (Signed)
Revealed negative genetic testing on the colorectal cancer panel.  Explained that this does not tell us why he has developed colorectal cancer, and therefore his children and siblings should be screened starting 28 years younger than his age of onset.  Discussed staying in contact with genetics because over time our testing improves and our knowledge becomes greater.  Therefore there may be additional testing in the future that we could offer.  Patient voiced understanding.

## 2016-01-21 ENCOUNTER — Ambulatory Visit
Admission: RE | Admit: 2016-01-21 | Discharge: 2016-01-21 | Disposition: A | Discharge status: Home / Self Care | Payer: PRIVATE HEALTH INSURANCE | Source: Ambulatory Visit | Attending: Radiation Oncology | Admitting: Radiation Oncology

## 2016-01-21 DIAGNOSIS — Z510 Encounter for antineoplastic radiation therapy: Secondary | ICD-10-CM | POA: Diagnosis not present

## 2016-01-22 ENCOUNTER — Encounter: Payer: Self-pay | Admitting: *Deleted

## 2016-01-22 ENCOUNTER — Ambulatory Visit
Admission: RE | Admit: 2016-01-22 | Discharge: 2016-01-22 | Disposition: A | Discharge status: Home / Self Care | Payer: PRIVATE HEALTH INSURANCE | Source: Ambulatory Visit | Attending: Radiation Oncology | Admitting: Radiation Oncology

## 2016-01-22 DIAGNOSIS — Z510 Encounter for antineoplastic radiation therapy: Secondary | ICD-10-CM | POA: Diagnosis not present

## 2016-01-22 NOTE — Progress Notes (Signed)
Oncology Nurse Navigator Documentation  Oncology Nurse Navigator Flowsheets 01/22/2016  Navigator Location [facility]  Referral date to RadOnc/MedOnc -  Navigator Encounter Type Treatment  Telephone -  Abnormal Finding Date -  Confirmed Diagnosis Date -  Surgery Date -  Genetic Counseling Date 01/02/2016  Genetic Counseling Type Non-Urgent  Treatment Initiated Date -  Patient Visit Type RadOnc  Treatment Phase Active Tx--RT 17/28 with Xeloda  Barriers/Navigation Needs No barriers at this time;Education  Education Symptom Management--diarrhea occasional/skin care to rectum  Interventions Education--keep Lomotil with him at all times and take after 1st loose stool; baby wipes, Desitin and use warm water from squeeze bottle after BM is rectal area is sore.  Referrals -  Coordination of Care -  Education Method Verbal  Support Groups/Services -  Acuity -  Time Spent with Patient 15  Reports his hands and feet are dryer and feel thicker. Encourage to use moisturizer several times/day and especially at night. Feels more fatigue, but is still able to work and keep up all ADLs. Encouraged him that all he is telling navigator is normal.

## 2016-01-23 ENCOUNTER — Ambulatory Visit
Admission: RE | Admit: 2016-01-23 | Discharge: 2016-01-23 | Disposition: A | Discharge status: Home / Self Care | Payer: PRIVATE HEALTH INSURANCE | Source: Ambulatory Visit | Attending: Radiation Oncology | Admitting: Radiation Oncology

## 2016-01-23 DIAGNOSIS — Z510 Encounter for antineoplastic radiation therapy: Secondary | ICD-10-CM | POA: Diagnosis not present

## 2016-01-24 ENCOUNTER — Ambulatory Visit
Admission: RE | Admit: 2016-01-24 | Discharge: 2016-01-24 | Disposition: A | Discharge status: Home / Self Care | Payer: PRIVATE HEALTH INSURANCE | Source: Ambulatory Visit | Attending: Radiation Oncology | Admitting: Radiation Oncology

## 2016-01-24 DIAGNOSIS — Z510 Encounter for antineoplastic radiation therapy: Secondary | ICD-10-CM | POA: Diagnosis not present

## 2016-01-25 ENCOUNTER — Encounter: Payer: Self-pay | Admitting: Radiation Oncology

## 2016-01-25 ENCOUNTER — Ambulatory Visit
Admission: RE | Admit: 2016-01-25 | Discharge: 2016-01-25 | Disposition: A | Discharge status: Home / Self Care | Payer: PRIVATE HEALTH INSURANCE | Source: Ambulatory Visit | Attending: Radiation Oncology | Admitting: Radiation Oncology

## 2016-01-25 VITALS — BP 144/97 | HR 78 | Temp 98.2°F | Resp 20 | Wt 238.0 lb

## 2016-01-25 DIAGNOSIS — Z510 Encounter for antineoplastic radiation therapy: Secondary | ICD-10-CM | POA: Diagnosis not present

## 2016-01-25 DIAGNOSIS — C187 Malignant neoplasm of sigmoid colon: Secondary | ICD-10-CM

## 2016-01-25 MED ORDER — PRAMOXINE HCL 1 % RE FOAM: 1.0000 "application " | Freq: Three times a day (TID) | RECTAL | 1 refills | Status: DC | PRN

## 2016-01-25 NOTE — Progress Notes (Signed)
Weekly  Rad txs pelvis, occasional  Bouts diarrhea,takes imodium  And some burning  So he takes  Prep h with aloe, had  Pain inside   Rectal area for 10 minutes, then resolved,   Which helps, bouts nausea but resolves on its own, Fatigue and appetite good 8:12 AM BP (!) 144/97 (BP Location: Left Arm, Patient Position: Sitting, Cuff Size: Normal)   Pulse 78   Temp 98.2 F (36.8 C) (Oral)   Resp 20   Wt 238 lb (108 kg)   BMI 33.19 kg/m   Wt Readings from Last 3 Encounters:  01/25/16 238 lb (108 kg)  01/16/16 239 lb 3.2 oz (108.5 kg)  01/15/16 241 lb 3.2 oz (109.4 kg)

## 2016-01-25 NOTE — Progress Notes (Signed)
   Department of Radiation Oncology  Phone:  [phone] Fax:        [phone]  Weekly Treatment Note    Name: Marc King Date: 01/25/2016 MRN: [id number] DOB: Jul 08, 1975   Diagnosis:     ICD-9-CM ICD-10-CM   1. Cancer of sigmoid colon ([place]) 153.3 C18.7      Current dose: 36 Gy  Current fraction: 20   MEDICATIONS: Current Outpatient Prescriptions  Medication Sig Dispense Refill  . acetaminophen (TYLENOL) 500 MG tablet Take 1,000 mg by mouth every 6 (six) hours as needed (For pain.).     Marland Kitchen capecitabine (XELODA) 500 MG tablet Take 4 tabs (2,000 mg) QAM and 3 tabs (1,500 mg) daily on days of radiation only. (Mon-Fri) 196 tablet   . ibuprofen (ADVIL,MOTRIN) 200 MG tablet Take 200-400 mg by mouth every 6 (six) hours as needed (For pain.).    Marland Kitchen lidocaine-prilocaine (EMLA) cream Apply to port site one hour prior to use. Do not rub in, cover with plastic 30 g 1  . polyethylene glycol (MIRALAX / GLYCOLAX) packet Take 17 g by mouth daily as needed.    Marland Kitchen oxyCODONE (OXY IR/ROXICODONE) 5 MG immediate release tablet Take 1-2 tablets (5-10 mg total) by mouth every 4 (four) hours as needed for moderate pain. (Patient not taking: Reported on 01/25/2016) 40 tablet 0  . pramoxine (PROCTOFOAM) 1 % foam Place 1 application rectally 3 (three) times daily as needed for irritation. 15 g 1  . prochlorperazine (COMPAZINE) 10 MG tablet Take 1 tablet (10 mg total) by mouth every 6 (six) hours as needed for nausea or vomiting. (Patient not taking: Reported on 01/25/2016) 30 tablet 1   No current facility-administered medications for this encounter.      ALLERGIES: Review of patient's allergies indicates no known allergies.   LABORATORY DATA:  Lab Results  Component Value Date   WBC 4.5 01/15/2016   HGB 14.6 01/15/2016   HCT 42.7 01/15/2016   MCV 85.0 01/15/2016   PLT 164 01/15/2016   Lab Results  Component Value Date   NA 137 01/15/2016   K 4.7 01/15/2016   CL 105 10/03/2015   CO2  23 01/15/2016   Lab Results  Component Value Date   ALT 19 01/15/2016   AST 23 01/15/2016   ALKPHOS 45 01/15/2016   BILITOT 0.78 01/15/2016     NARRATIVE: Marc King was seen today for weekly treatment management. The chart was checked and the patient's films were reviewed.  Weekly rad txs pelvis. Occasional bouts of diarrhea, takes imodium. Occasional rectal irritation.Takes Prep-H with aloe which helps. Nausea, but this resolves on its own. Fatigue and good appetite.  PHYSICAL EXAMINATION: weight is 238 lb (108 kg). His oral temperature is 98.2 F (36.8 C). His blood pressure is 144/97 (abnormal) and his pulse is 78. His respiration is 20.  Alert, no acute distress  ASSESSMENT: The patient is doing satisfactorily with treatment.  PLAN: We will continue with the patient's radiation treatment as planned.  ------------------------------------------------  Jodelle Gross, MD, PhD  This document serves as a record of services personally performed by Kyung Rudd, MD. It was created on their behalf by Darcus Austin, a trained medical scribe. The creation of this record is based on the scribe's personal observations and the providers' statements to them. This document has been checked and approved by the attending provider.

## 2016-01-28 ENCOUNTER — Telehealth: Payer: Self-pay | Admitting: Oncology

## 2016-01-28 ENCOUNTER — Ambulatory Visit
Admission: RE | Admit: 2016-01-28 | Discharge: 2016-01-28 | Disposition: A | Discharge status: Home / Self Care | Payer: PRIVATE HEALTH INSURANCE | Source: Ambulatory Visit | Attending: Radiation Oncology | Admitting: Radiation Oncology

## 2016-01-28 DIAGNOSIS — Z510 Encounter for antineoplastic radiation therapy: Secondary | ICD-10-CM | POA: Diagnosis not present

## 2016-01-28 NOTE — Telephone Encounter (Signed)
LEFT MESSAGE RE 10/31 APPOINTMENTS.

## 2016-01-29 ENCOUNTER — Ambulatory Visit
Admission: RE | Admit: 2016-01-29 | Discharge: 2016-01-29 | Disposition: A | Discharge status: Home / Self Care | Payer: PRIVATE HEALTH INSURANCE | Source: Ambulatory Visit | Attending: Radiation Oncology | Admitting: Radiation Oncology

## 2016-01-29 ENCOUNTER — Ambulatory Visit (HOSPITAL_BASED_OUTPATIENT_CLINIC_OR_DEPARTMENT_OTHER): Payer: PRIVATE HEALTH INSURANCE | Admitting: Oncology

## 2016-01-29 ENCOUNTER — Other Ambulatory Visit (HOSPITAL_BASED_OUTPATIENT_CLINIC_OR_DEPARTMENT_OTHER): Payer: PRIVATE HEALTH INSURANCE

## 2016-01-29 VITALS — BP 126/96 | HR 79 | Temp 98.1°F | Resp 18 | Ht 71.0 in | Wt 239.4 lb

## 2016-01-29 DIAGNOSIS — D701 Agranulocytosis secondary to cancer chemotherapy: Secondary | ICD-10-CM | POA: Diagnosis not present

## 2016-01-29 DIAGNOSIS — C187 Malignant neoplasm of sigmoid colon: Secondary | ICD-10-CM

## 2016-01-29 DIAGNOSIS — D509 Iron deficiency anemia, unspecified: Secondary | ICD-10-CM | POA: Diagnosis not present

## 2016-01-29 DIAGNOSIS — D6959 Other secondary thrombocytopenia: Secondary | ICD-10-CM | POA: Diagnosis not present

## 2016-01-29 DIAGNOSIS — Z510 Encounter for antineoplastic radiation therapy: Secondary | ICD-10-CM | POA: Diagnosis not present

## 2016-01-29 LAB — COMPREHENSIVE METABOLIC PANEL
ALBUMIN: 3.7 g/dL (ref 3.5–5.0)
ALK PHOS: 46 U/L (ref 40–150)
ALT: 26 U/L (ref 0–55)
ANION GAP: 9 meq/L (ref 3–11)
AST: 31 U/L (ref 5–34)
BILIRUBIN TOTAL: 1.17 mg/dL (ref 0.20–1.20)
BUN: 7.4 mg/dL (ref 7.0–26.0)
CALCIUM: 9.3 mg/dL (ref 8.4–10.4)
CO2: 27 meq/L (ref 22–29)
CREATININE: 0.9 mg/dL (ref 0.7–1.3)
Chloride: 103 mEq/L (ref 98–109)
Glucose: 169 mg/dl — ABNORMAL HIGH (ref 70–140)
Potassium: 4.4 mEq/L (ref 3.5–5.1)
Sodium: 139 mEq/L (ref 136–145)
TOTAL PROTEIN: 7.2 g/dL (ref 6.4–8.3)

## 2016-01-29 LAB — CBC WITH DIFFERENTIAL/PLATELET
BASO%: 0.3 % (ref 0.0–2.0)
Basophils Absolute: 0 10*3/uL (ref 0.0–0.1)
EOS ABS: 0.1 10*3/uL (ref 0.0–0.5)
EOS%: 2.6 % (ref 0.0–7.0)
HEMATOCRIT: 39.2 % (ref 38.4–49.9)
HEMOGLOBIN: 13.8 g/dL (ref 13.0–17.1)
LYMPH#: 0.3 10*3/uL — AB (ref 0.9–3.3)
LYMPH%: 9.3 % — ABNORMAL LOW (ref 14.0–49.0)
MCH: 30.2 pg (ref 27.2–33.4)
MCHC: 35.2 g/dL (ref 32.0–36.0)
MCV: 85.8 fL (ref 79.3–98.0)
MONO#: 0.6 10*3/uL (ref 0.1–0.9)
MONO%: 17.4 % — ABNORMAL HIGH (ref 0.0–14.0)
NEUT%: 70.4 % (ref 39.0–75.0)
NEUTROS ABS: 2.4 10*3/uL (ref 1.5–6.5)
PLATELETS: 152 10*3/uL (ref 140–400)
RBC: 4.57 10*6/uL (ref 4.20–5.82)
RDW: 18.7 % — ABNORMAL HIGH (ref 11.0–14.6)
WBC: 3.5 10*3/uL — AB (ref 4.0–10.3)

## 2016-01-29 NOTE — Progress Notes (Signed)
[place] OFFICE PROGRESS NOTE   Diagnosis: Colon cancer  INTERVAL HISTORY:   Marc King returns as scheduled. He continues Xeloda and radiation. He has frequent small volume loose stools. Mild nausea. He has developed malaise, but is able to continue working. No hand or foot pain. He has noted "soreness "in the bilateral hip area. He wonders physical activity over the weekend.  Objective:  Vital signs in last 24 hours:  Blood pressure (!) 131/97, pulse 75, temperature 98.1 F (36.7 C), temperature source Oral, resp. rate 18, height '5\' 11"'$  (1.803 m), weight 239 lb 6.4 oz (108.6 kg), SpO2 100 %.    HEENT: No thrush or ulcers Resp: Lungs clear bilaterally Cardio: Regular rate and rhythm GI: No hepatosplenomegaly, soft, nontender Vascular: No leg edema Neuro: No leg edema  Skin: Dryness with superficial flaking at the palms and soles  Musculoskeletal: No pain with motion at the hips, mild tenderness at the left trochanter  Portacath/mild erythema surrounding the port site without tenderness or fluctuance    Lab Results:  Lab Results  Component Value Date   WBC 3.5 (L) 01/29/2016   HGB 13.8 01/29/2016   HCT 39.2 01/29/2016   MCV 85.8 01/29/2016   PLT 152 01/29/2016   NEUTROABS 2.4 01/29/2016      Medications: I have reviewed the patient's current medications.  Assessment/Plan: 1. Adenocarcinoma the sigmoid colon, status post a colonoscopic biopsy 08/07/2015 ? CT abdomen/pelvis 07/05/2015 with masslike thickening of the sigmoid colon, adjacent lymphadenopathy, and prominent para-aortic and porta hepatis nodes ? CT chest 08/28/2015 with bilateral pulmonary nodules suspicious for metastases ? PET scan 09/05/2015 with sigmoid colon mass SUV max 18.4; numerous lymph nodes in the sigmoid mesocolon most of which are too small for PET resolution; 8 mm right external iliac chain lymph node SUV max 5.2; hypermetabolic lymph nodes tracking superiorly along the  retroperitoneum extending to the aortocaval station at the level of the renal arteries; index lymph node at the level of the aortic bifurcation measures 12 mm with an SUV max of 4.5; hepato duodenal lymph node measures 10 mm with SUV max 3.6; no abnormal hypermetabolism in the liver, adrenal glands, spleen or pancreas. A few scattered pulmonary nodules which do not show abnormal hypermetabolism, too small for PET resolution. ? 09/13/2015 status post laparoscopic sigmoidectomy and cystoscopy with bilateral stent placement ? Preserved expression of the major and minor MMR proteins. MSI-stable ? 7 cm adenocarcinoma of the sigmoid colon, grade 2; tumor invades through the colonic wall and adherent to the pelvic sidewall and anterior peritoneum; 28 benign lymph nodes ? CT biopsy of a hypermetabolic left retroperitoneal lymph node on 10/03/2015-pathology negative for malignancy ? Cycle 1 FOLFOX 10/11/2015 ? Cycle 2 FOLFOX 10/25/2015, Neulasta added ? Cycle 3 FOLFOX 11/08/2015 ? Cycle 4 FOLFOX 11/22/2015 ? Xeloda/radiation starting 12/31/2015  2. History of Right lower abdominal pain secondary to #1  3. Mild neutropenia secondary to chemotherapy 10/25/2015. Neulasta added beginning with cycle 2.  4. Microcytic anemia. Question iron deficiency. He is taking iron  5. History of Thrombocytopenia secondary to chemotherapy    Disposition:  Marc King appears stable. He will complete Xeloda/radiation on 02/06/2016. We discussed the risk/benefit of continuing systemic therapy beyond the completion of radiation. I recommend at least 2 more cycles of FOLFOX to be followed by additional Xeloda to complete a total of a proximally 6 months of adjuvant therapy. He agrees to a follow-up visit and FOLFOX on 02/28/2016.  He will contact us for  persistent discomfort at the "hip ". I suspect this is related to a benign musculoskeletal condition.   We will plan for a restaging CT evaluation in  January to follow-up on the lung nodules. We will check a CEA when he returns on 02/28/2016. Marc King will be scheduled for Port-A-Cath flush next week.  Betsy Coder, MD  01/29/2016  8:52 AM

## 2016-01-30 ENCOUNTER — Ambulatory Visit
Admission: RE | Admit: 2016-01-30 | Discharge: 2016-01-30 | Disposition: A | Discharge status: Home / Self Care | Payer: PRIVATE HEALTH INSURANCE | Source: Ambulatory Visit | Attending: Radiation Oncology | Admitting: Radiation Oncology

## 2016-01-30 DIAGNOSIS — Z510 Encounter for antineoplastic radiation therapy: Secondary | ICD-10-CM | POA: Diagnosis not present

## 2016-01-31 ENCOUNTER — Ambulatory Visit
Admission: RE | Admit: 2016-01-31 | Discharge: 2016-01-31 | Disposition: A | Discharge status: Home / Self Care | Payer: PRIVATE HEALTH INSURANCE | Source: Ambulatory Visit | Attending: Radiation Oncology | Admitting: Radiation Oncology

## 2016-01-31 DIAGNOSIS — Z510 Encounter for antineoplastic radiation therapy: Secondary | ICD-10-CM | POA: Diagnosis not present

## 2016-02-01 ENCOUNTER — Encounter: Payer: Self-pay | Admitting: Radiation Oncology

## 2016-02-01 ENCOUNTER — Ambulatory Visit
Admission: RE | Admit: 2016-02-01 | Discharge: 2016-02-01 | Disposition: A | Discharge status: Home / Self Care | Payer: PRIVATE HEALTH INSURANCE | Source: Ambulatory Visit | Attending: Radiation Oncology | Admitting: Radiation Oncology

## 2016-02-01 VITALS — BP 135/98 | HR 70 | Temp 98.2°F | Ht 71.0 in | Wt 239.0 lb

## 2016-02-01 DIAGNOSIS — Z510 Encounter for antineoplastic radiation therapy: Secondary | ICD-10-CM | POA: Diagnosis not present

## 2016-02-01 DIAGNOSIS — C187 Malignant neoplasm of sigmoid colon: Secondary | ICD-10-CM

## 2016-02-01 NOTE — Progress Notes (Signed)
Department of Radiation Oncology  Phone:  [phone] Fax:        [phone]  Weekly Treatment Note    Name: Marc King Date: 02/01/2016 MRN: [id number] DOB: Apr 16, 1975   Diagnosis:     ICD-9-CM ICD-10-CM   1. Cancer of sigmoid colon ([place]) 153.3 C18.7      Current dose: 45 Gy  Current fraction: 25   MEDICATIONS: Current Outpatient Prescriptions  Medication Sig Dispense Refill  . acetaminophen (TYLENOL) 500 MG tablet Take 1,000 mg by mouth every 6 (six) hours as needed (For pain.).     Marland Kitchen capecitabine (XELODA) 500 MG tablet Take 4 tabs (2,000 mg) QAM and 3 tabs (1,500 mg) daily on days of radiation only. (Mon-Fri) 196 tablet   . ibuprofen (ADVIL,MOTRIN) 200 MG tablet Take 200-400 mg by mouth every 6 (six) hours as needed (For pain.).    Marland Kitchen lidocaine-prilocaine (EMLA) cream Apply to port site one hour prior to use. Do not rub in, cover with plastic 30 g 1  . oxyCODONE (OXY IR/ROXICODONE) 5 MG immediate release tablet Take 1-2 tablets (5-10 mg total) by mouth every 4 (four) hours as needed for moderate pain. (Patient not taking: Reported on 02/01/2016) 40 tablet 0  . polyethylene glycol (MIRALAX / GLYCOLAX) packet Take 17 g by mouth daily as needed.    . pramoxine (PROCTOFOAM) 1 % foam Place 1 application rectally 3 (three) times daily as needed for irritation. (Patient not taking: Reported on 02/01/2016) 15 g 1  . prochlorperazine (COMPAZINE) 10 MG tablet Take 1 tablet (10 mg total) by mouth every 6 (six) hours as needed for nausea or vomiting. (Patient not taking: Reported on 02/01/2016) 30 tablet 1   No current facility-administered medications for this encounter.      ALLERGIES: Review of patient's allergies indicates no known allergies.   LABORATORY DATA:  Lab Results  Component Value Date   WBC 3.5 (L) 01/29/2016   HGB 13.8 01/29/2016   HCT 39.2 01/29/2016   MCV 85.8 01/29/2016   PLT 152 01/29/2016   Lab Results  Component Value Date   NA 139 01/29/2016   K 4.4 01/29/2016   CL 105 10/03/2015   CO2 27 01/29/2016   Lab Results  Component Value Date   ALT 26 01/29/2016   AST 31 01/29/2016   ALKPHOS 46 01/29/2016   BILITOT 1.17 01/29/2016     NARRATIVE: Marc King was seen today for weekly treatment management. The chart was checked and the patient's films were reviewed.  Marc King has complete 25 fractions to his pelvis.  He is taking Xeloda.  He reports having some soreness of the skin on his lower back and pelvic area.  He reports the skin is red and he is applying Aquaphor to the areas.  He reports having slight dysuria.  He denies having hematuria and nocturia.  He reports having occasional diarrhea that he controls with food.  He has not picked up proctofoam as it was not covered by his insurance.  He said he does not have the pain he was having so he doesn't think he needs it.  He reports having fatigue.   PHYSICAL EXAMINATION: height is 5\' 11"  (1.803 m) and weight is 239 lb (108.4 kg). His oral temperature is 98.2 F (36.8 C). His blood pressure is 135/98 (abnormal) and his pulse is 70. His oxygen saturation is 100%.  Alert, no acute distress  ASSESSMENT: The patient is doing satisfactorily with treatment.  PLAN: We will  continue with the patient's radiation treatment as planned. The patient will follow up in 1 month after he completes his treatment.  ------------------------------------------------  Jodelle Gross, MD, PhD  This document serves as a record of services personally performed by Kyung Rudd, MD. It was created on their behalf by Darcus Austin, a trained medical scribe. The creation of this record is based on the scribe's personal observations and the providers' statements to them. This document has been checked and approved by the attending provider.

## 2016-02-01 NOTE — Progress Notes (Addendum)
Marc King has complete 25 fractions to his pelvis.  He is taking Xeloda.  He reports having some soreness of his skin on his lower back and pelvic area.  He reports the skin is red and he is applying Aquaphor to the areas.  He reports having slight dysuria.  He denies having hematuria and nocturia.  He reports having occasional diarrhea that he controls with food.  He has not picked up proctofoam as it was not covered by his insurance.  He said he does not have the pain he was having so he doesn't think he needs it.  He reports having fatigue.  He has been given a one month follow up appointment with Shona Simpson, PA.   BP (!) 135/98 (BP Location: Right Arm, Patient Position: Sitting)   Pulse 70   Temp 98.2 F (36.8 C) (Oral)   Ht 5\' 11"  (1.803 m)   Wt 239 lb (108.4 kg)   SpO2 100%   BMI 33.33 kg/m    Wt Readings from Last 3 Encounters:  02/01/16 239 lb (108.4 kg)  01/29/16 239 lb 6.4 oz (108.6 kg)  01/25/16 238 lb (108 kg)

## 2016-02-01 NOTE — Progress Notes (Signed)
  Radiation Oncology         [phone]) [phone] ________________________________  Name: Marc King MRN: [id number]  Date: 01/25/2016  DOB: 06-03-1975  COMPLEX SIMULATION  NOTE  Diagnosis: rectal cancer  Narrative The patient has initially been planned to receive a course of radiation treatment to a dose of 45 Gy in 25 fractions at 1.8 gray per fraction. The patient will now receive a boost to the high risk target volume for an additional 5.4 Gy. This will be delivered in 3 fractions at 1.8 Gy per fraction and a cone down boost technique will be utilized. To accomplish this, an additional 4 customized blocks have been designed for this purpose. A complex isodose plan is requested to ensure that the high-risk target region receives the appropriate radiation dose and that the nearby normal structures continue to be appropriately spared. The patient's final total dose therefore will be 50.4 Gy.   ________________________________ ------------------------------------------------  Jodelle Gross, MD, PhD

## 2016-02-04 ENCOUNTER — Ambulatory Visit
Admission: RE | Admit: 2016-02-04 | Discharge: 2016-02-04 | Disposition: A | Discharge status: Home / Self Care | Payer: PRIVATE HEALTH INSURANCE | Source: Ambulatory Visit | Attending: Radiation Oncology | Admitting: Radiation Oncology

## 2016-02-04 DIAGNOSIS — Z510 Encounter for antineoplastic radiation therapy: Secondary | ICD-10-CM | POA: Diagnosis not present

## 2016-02-05 ENCOUNTER — Telehealth: Payer: Self-pay | Admitting: Oncology

## 2016-02-05 ENCOUNTER — Ambulatory Visit
Admission: RE | Admit: 2016-02-05 | Discharge: 2016-02-05 | Disposition: A | Discharge status: Home / Self Care | Payer: PRIVATE HEALTH INSURANCE | Source: Ambulatory Visit | Attending: Radiation Oncology | Admitting: Radiation Oncology

## 2016-02-05 DIAGNOSIS — Z510 Encounter for antineoplastic radiation therapy: Secondary | ICD-10-CM | POA: Diagnosis not present

## 2016-02-05 NOTE — Telephone Encounter (Signed)
Left message re 11/8 flush. Patient to get new schedule 11/8.

## 2016-02-06 ENCOUNTER — Telehealth: Payer: Self-pay | Admitting: Oncology

## 2016-02-06 ENCOUNTER — Ambulatory Visit
Admission: RE | Admit: 2016-02-06 | Discharge: 2016-02-06 | Disposition: A | Discharge status: Home / Self Care | Payer: PRIVATE HEALTH INSURANCE | Source: Ambulatory Visit | Attending: Radiation Oncology | Admitting: Radiation Oncology

## 2016-02-06 ENCOUNTER — Ambulatory Visit (HOSPITAL_BASED_OUTPATIENT_CLINIC_OR_DEPARTMENT_OTHER): Payer: PRIVATE HEALTH INSURANCE

## 2016-02-06 ENCOUNTER — Encounter: Payer: Self-pay | Admitting: Radiation Oncology

## 2016-02-06 VITALS — BP 139/93 | HR 73 | Temp 98.0°F | Resp 20 | Wt 240.6 lb

## 2016-02-06 VITALS — BP 135/92 | HR 70 | Temp 98.7°F | Resp 18

## 2016-02-06 DIAGNOSIS — Z510 Encounter for antineoplastic radiation therapy: Secondary | ICD-10-CM | POA: Diagnosis not present

## 2016-02-06 DIAGNOSIS — Z452 Encounter for adjustment and management of vascular access device: Secondary | ICD-10-CM

## 2016-02-06 DIAGNOSIS — C187 Malignant neoplasm of sigmoid colon: Secondary | ICD-10-CM

## 2016-02-06 DIAGNOSIS — Z95828 Presence of other vascular implants and grafts: Secondary | ICD-10-CM

## 2016-02-06 MED ORDER — HEPARIN SOD (PORK) LOCK FLUSH 100 UNIT/ML IV SOLN
500.0000 [IU] | Freq: Once | INTRAVENOUS | Status: AC | PRN
  Administered 2016-02-06: 500 [IU] via INTRAVENOUS
  Filled 2016-02-06: qty 5

## 2016-02-06 MED ORDER — SODIUM CHLORIDE 0.9% FLUSH
10.0000 mL | INTRAVENOUS | Status: DC | PRN
  Administered 2016-02-06: 10 mL via INTRAVENOUS
  Filled 2016-02-06: qty 10

## 2016-02-06 NOTE — Telephone Encounter (Signed)
Copy of appointment schedule was given to patient, 02/06/16.

## 2016-02-06 NOTE — Progress Notes (Signed)
Weekly rad tx rectum, 28/28 completed, has irritation to his bottom, no skin breakdown, has 4-6 loose stools day,takes imodium prn, bay wipes, no sitz baths, appetite good , Xeloda bid, gets fatigued end of the day 1 month f/u appt already made 8:00 AM BP (!) 139/93 (BP Location: Left Arm, Patient Position: Sitting, Cuff Size: Small)   Pulse 73   Temp 98 F (36.7 C) (Oral)   Resp 20   Wt 240 lb 9.6 oz (109.1 kg)   BMI 33.56 kg/m   Wt Readings from Last 3 Encounters:  02/06/16 240 lb 9.6 oz (109.1 kg)  02/01/16 239 lb (108.4 kg)  01/29/16 239 lb 6.4 oz (108.6 kg)

## 2016-02-06 NOTE — Progress Notes (Signed)
Department of Radiation Oncology  Phone:  [phone] Fax:        [phone]  Weekly Treatment Note    Name: JEREMIAS FINNER Date: 02/07/2016 MRN: [id number] DOB: 14-Sep-1975   Current dose: 50.4 Gy  Current fraction: 28   MEDICATIONS: Current Outpatient Prescriptions  Medication Sig Dispense Refill  . acetaminophen (TYLENOL) 500 MG tablet Take 1,000 mg by mouth every 6 (six) hours as needed (For pain.).     Marland Kitchen capecitabine (XELODA) 500 MG tablet Take 4 tabs (2,000 mg) QAM and 3 tabs (1,500 mg) daily on days of radiation only. (Mon-Fri) 196 tablet   . ibuprofen (ADVIL,MOTRIN) 200 MG tablet Take 200-400 mg by mouth every 6 (six) hours as needed (For pain.).    Marland Kitchen lidocaine-prilocaine (EMLA) cream Apply to port site one hour prior to use. Do not rub in, cover with plastic 30 g 1  . oxyCODONE (OXY IR/ROXICODONE) 5 MG immediate release tablet Take 1-2 tablets (5-10 mg total) by mouth every 4 (four) hours as needed for moderate pain. (Patient not taking: Reported on 02/06/2016) 40 tablet 0  . polyethylene glycol (MIRALAX / GLYCOLAX) packet Take 17 g by mouth daily as needed.    . pramoxine (PROCTOFOAM) 1 % foam Place 1 application rectally 3 (three) times daily as needed for irritation. (Patient not taking: Reported on 02/06/2016) 15 g 1  . prochlorperazine (COMPAZINE) 10 MG tablet Take 1 tablet (10 mg total) by mouth every 6 (six) hours as needed for nausea or vomiting. (Patient not taking: Reported on 02/06/2016) 30 tablet 1   No current facility-administered medications for this encounter.      ALLERGIES: Patient has no known allergies.   LABORATORY DATA:  Lab Results  Component Value Date   WBC 3.5 (L) 01/29/2016   HGB 13.8 01/29/2016   HCT 39.2 01/29/2016   MCV 85.8 01/29/2016   PLT 152 01/29/2016   Lab Results  Component Value Date   NA 139 01/29/2016   K 4.4 01/29/2016   CL 105 10/03/2015   CO2 27 01/29/2016   Lab Results  Component Value Date   ALT 26 01/29/2016     AST 31 01/29/2016   ALKPHOS 46 01/29/2016   BILITOT 1.17 01/29/2016     NARRATIVE: ALFIO ALLSUP was seen today for weekly treatment management. The chart was checked and the patient's films were reviewed.   Mr. Homan has completed 28/28 weekly radiation treatment to the rectum. He reports a little irritation to his bottom with no skin breakdown. He has been using Aquaphor lotion. He has 4 to 6 loose stools per day. He takes imodium as needed and uses baby wipes. He is not doing sitz baths. He has been watching his diet. His appetite is good. Taking Xeloda bid. He reports fatigue at the end of the day. He is feeling well overall.   PHYSICAL EXAMINATION: weight is 240 lb 9.6 oz (109.1 kg). His oral temperature is 98 F (36.7 C). His blood pressure is 139/93 (abnormal) and his pulse is 73. His respiration is 20.     Alert and in no acute distress.   ASSESSMENT: The patient did satisfactorily with treatment.  PLAN: The patient will follow-up in our clinic in 1 month.  ------------------------------------------------  Jodelle Gross, MD, PhD  This document serves as a record of services personally performed by Kyung Rudd, MD. It was created on his behalf by Arlyce Harman, a trained medical scribe. The creation of this record is based on  the scribe's personal observations and the provider's statements to them. This document has been checked and approved by the attending provider.

## 2016-02-11 ENCOUNTER — Encounter: Payer: Self-pay | Admitting: Radiation Oncology

## 2016-02-11 NOTE — Progress Notes (Signed)
  Radiation Oncology         [phone]) [phone] ________________________________  Name: DALTON CURTISS MRN: [id number]  Date: 02/11/2016  DOB: 23-Jan-1976  End of Treatment Note  Diagnosis:   Malignant neoplasm of sigmoid colon    Indication for treatment:  Curative      Radiation treatment dates:   12/31/15-02/06/16  Site/dose:   1. Pelvis/ 45 Gy in 25 fractions   2. Pelvis boost/ 5.4 Gy in 3 fractions  Beams/energy:   1. 3D / 15X, 6X    2. Isodose plan/ 15X  Narrative: The patient tolerated radiation treatment relatively well.   Patient reported little irritation with no skin breakdown.   Plan: The patient has completed radiation treatment. The patient will return to radiation oncology clinic for routine followup in one month. I advised them to call or return sooner if they have any questions or concerns related to their recovery or treatment.  ------------------------------------------------  Jodelle Gross, MD, PhD  This document serves as a record of services personally performed by Kyung Rudd, MD. It was created on his behalf by Bethann Humble, a trained medical scribe. The creation of this record is based on the scribe's personal observations and the provider's statements to them. This document has been checked and approved by the attending provider.

## 2016-02-15 ENCOUNTER — Telehealth: Payer: Self-pay | Admitting: *Deleted

## 2016-02-15 NOTE — Telephone Encounter (Signed)
Call from pt reporting he has decided to discontinue chemo. He will keep lab/flush and office visit as scheduled but will not be taking chemo. Message to Dr. Benay Spice.

## 2016-02-28 ENCOUNTER — Ambulatory Visit (HOSPITAL_BASED_OUTPATIENT_CLINIC_OR_DEPARTMENT_OTHER): Payer: PRIVATE HEALTH INSURANCE

## 2016-02-28 ENCOUNTER — Other Ambulatory Visit (HOSPITAL_BASED_OUTPATIENT_CLINIC_OR_DEPARTMENT_OTHER): Payer: PRIVATE HEALTH INSURANCE

## 2016-02-28 ENCOUNTER — Ambulatory Visit (HOSPITAL_BASED_OUTPATIENT_CLINIC_OR_DEPARTMENT_OTHER): Payer: PRIVATE HEALTH INSURANCE | Admitting: Oncology

## 2016-02-28 ENCOUNTER — Ambulatory Visit: Payer: PRIVATE HEALTH INSURANCE

## 2016-02-28 VITALS — BP 134/92 | HR 66 | Temp 98.5°F | Resp 20 | Ht 71.0 in | Wt 239.2 lb

## 2016-02-28 DIAGNOSIS — D701 Agranulocytosis secondary to cancer chemotherapy: Secondary | ICD-10-CM

## 2016-02-28 DIAGNOSIS — D509 Iron deficiency anemia, unspecified: Secondary | ICD-10-CM

## 2016-02-28 DIAGNOSIS — Z452 Encounter for adjustment and management of vascular access device: Secondary | ICD-10-CM

## 2016-02-28 DIAGNOSIS — I10 Essential (primary) hypertension: Secondary | ICD-10-CM

## 2016-02-28 DIAGNOSIS — D6959 Other secondary thrombocytopenia: Secondary | ICD-10-CM | POA: Diagnosis not present

## 2016-02-28 DIAGNOSIS — Z95828 Presence of other vascular implants and grafts: Secondary | ICD-10-CM

## 2016-02-28 DIAGNOSIS — C187 Malignant neoplasm of sigmoid colon: Secondary | ICD-10-CM | POA: Diagnosis not present

## 2016-02-28 LAB — COMPREHENSIVE METABOLIC PANEL
ALBUMIN: 3.8 g/dL (ref 3.5–5.0)
ALK PHOS: 44 U/L (ref 40–150)
ALT: 32 U/L (ref 0–55)
ANION GAP: 8 meq/L (ref 3–11)
AST: 29 U/L (ref 5–34)
BUN: 10.6 mg/dL (ref 7.0–26.0)
CALCIUM: 9.2 mg/dL (ref 8.4–10.4)
CO2: 24 mEq/L (ref 22–29)
Chloride: 106 mEq/L (ref 98–109)
Creatinine: 0.8 mg/dL (ref 0.7–1.3)
Glucose: 190 mg/dl — ABNORMAL HIGH (ref 70–140)
POTASSIUM: 4 meq/L (ref 3.5–5.1)
Sodium: 139 mEq/L (ref 136–145)
Total Bilirubin: 0.82 mg/dL (ref 0.20–1.20)
Total Protein: 7.4 g/dL (ref 6.4–8.3)

## 2016-02-28 LAB — CBC WITH DIFFERENTIAL/PLATELET
BASO%: 0.2 % (ref 0.0–2.0)
BASOS ABS: 0 10*3/uL (ref 0.0–0.1)
EOS ABS: 0.1 10*3/uL (ref 0.0–0.5)
EOS%: 1.1 % (ref 0.0–7.0)
HEMATOCRIT: 40.1 % (ref 38.4–49.9)
HEMOGLOBIN: 14.2 g/dL (ref 13.0–17.1)
LYMPH#: 0.5 10*3/uL — AB (ref 0.9–3.3)
LYMPH%: 11.2 % — ABNORMAL LOW (ref 14.0–49.0)
MCH: 32 pg (ref 27.2–33.4)
MCHC: 35.4 g/dL (ref 32.0–36.0)
MCV: 90.3 fL (ref 79.3–98.0)
MONO#: 0.5 10*3/uL (ref 0.1–0.9)
MONO%: 11 % (ref 0.0–14.0)
NEUT#: 3.5 10*3/uL (ref 1.5–6.5)
NEUT%: 76.5 % — AB (ref 39.0–75.0)
PLATELETS: 173 10*3/uL (ref 140–400)
RBC: 4.44 10*6/uL (ref 4.20–5.82)
RDW: 15.7 % — AB (ref 11.0–14.6)
WBC: 4.6 10*3/uL (ref 4.0–10.3)

## 2016-02-28 LAB — CEA (IN HOUSE-CHCC): CEA (CHCC-IN HOUSE): 1.89 ng/mL (ref 0.00–5.00)

## 2016-02-28 MED ORDER — SODIUM CHLORIDE 0.9% FLUSH
10.0000 mL | INTRAVENOUS | Status: DC | PRN
  Administered 2016-02-28: 10 mL via INTRAVENOUS
  Filled 2016-02-28: qty 10

## 2016-02-28 MED ORDER — HEPARIN SOD (PORK) LOCK FLUSH 100 UNIT/ML IV SOLN
500.0000 [IU] | Freq: Once | INTRAVENOUS | Status: AC | PRN
  Administered 2016-02-28: 500 [IU] via INTRAVENOUS
  Filled 2016-02-28: qty 5

## 2016-02-28 NOTE — Progress Notes (Signed)
  [facility] OFFICE PROGRESS NOTE   Diagnosis: Colon cancer  INTERVAL HISTORY:   Marc King returns as scheduled. He feels well. He is working. He completed Xeloda and radiation 02/06/2016.  Objective:  Vital signs in last 24 hours:  Blood pressure (!) 134/92, pulse 66, temperature 98.5 F (36.9 C), temperature source Oral, resp. rate 20, height '5\' 11"'$  (1.803 m), weight 239 lb 3.2 oz (108.5 kg), SpO2 100 %.    HEENT: Neck without mass Lymphatics: No cervical, supraclavicular, axillary, or inguinal nodes Resp: Lungs clear bilaterally Cardio: Regular rate and rhythm GI: No hepatomegaly, no mass, nontender Vascular: No leg edema   Lab Results:  Lab Results  Component Value Date   WBC 4.6 02/28/2016   HGB 14.2 02/28/2016   HCT 40.1 02/28/2016   MCV 90.3 02/28/2016   PLT 173 02/28/2016   NEUTROABS 3.5 02/28/2016     Medications: I have reviewed the patient's current medications.  Assessment/Plan: 1. Adenocarcinoma the sigmoid colon, status post a colonoscopic biopsy 08/07/2015 ? CT abdomen/pelvis 07/05/2015 with masslike thickening of the sigmoid colon, adjacent lymphadenopathy, and prominent para-aortic and porta hepatis nodes ? CT chest 08/28/2015 with bilateral pulmonary nodules suspicious for metastases ? PET scan 09/05/2015 with sigmoid colon mass SUV max 18.4; numerous lymph nodes in the sigmoid mesocolon most of which are too small for PET resolution; 8 mm right external iliac chain lymph node SUV max 5.2; hypermetabolic lymph nodes tracking superiorly along the retroperitoneum extending to the aortocaval station at the level of the renal arteries; index lymph node at the level of the aortic bifurcation measures 12 mm with an SUV max of 4.5; hepato duodenal lymph node measures 10 mm with SUV max 3.6; no abnormal hypermetabolism in the liver, adrenal glands, spleen or pancreas. A few scattered pulmonary nodules which do not show abnormal hypermetabolism,  too small for PET resolution. ? 09/13/2015 status post laparoscopic sigmoidectomy and cystoscopy with bilateral stent placement ? Preserved expression of the major and minor MMR proteins. MSI-stable ? 7 cm adenocarcinoma of the sigmoid colon, grade 2; tumor invades through the colonic wall and adherent to the pelvic sidewall and anterior peritoneum; 28 benign lymph nodes ? CT biopsy of a hypermetabolic left retroperitoneal lymph node on 10/03/2015-pathology negative for malignancy ? Cycle 1 FOLFOX 10/11/2015 ? Cycle 2 FOLFOX 10/25/2015, Neulasta added ? Cycle 3 FOLFOX 11/08/2015 ? Cycle 4 FOLFOX 11/22/2015 ? Xeloda/radiation starting 12/31/2015, completed 02/06/2016  2. History of Right lower abdominal pain secondary to #1  3. Mild neutropenia secondary to chemotherapy 10/25/2015. Neulasta added beginning with cycle 2.  4. History of Microcytic anemia. Question iron deficiency.  5. History of Thrombocytopenia secondary to chemotherapy    Disposition:  Marc King has completed a course of Xeloda and radiation. I recommend additional FOLFOX chemotherapy. He declines further chemotherapy. We will follow-up on the CEA from today. The Port-A-Cath will remain in place for now. He will return for an office visit and restaging chest CT in late December.  We will decide on removing the Port-A-Cath based on the follow-up chest CT. He will be due for a abdomen/pelvis CT and surveillance colonoscopy in approximately May 2018.  I recommended he follow-up with his primary physician for evaluation of hypertension.  Marc Coder, Marc King  02/28/2016  9:54 AM

## 2016-02-29 ENCOUNTER — Telehealth: Payer: Self-pay | Admitting: *Deleted

## 2016-02-29 LAB — CEA: CEA1: 2.8 ng/mL (ref 0.0–4.7)

## 2016-02-29 NOTE — Telephone Encounter (Signed)
Patient notified per Dr. Sherrill that cea is normal.  Patient appreciative of call and has no questions or concerns at this time.   

## 2016-02-29 NOTE — Telephone Encounter (Signed)
-----   Message from [name], MD sent at 02/28/2016  5:24 PM EST ----- Please call patient, Marc King is normal

## 2016-03-10 ENCOUNTER — Telehealth: Payer: Self-pay | Admitting: General Practice

## 2016-03-10 NOTE — Telephone Encounter (Signed)
Left msg regarding 03/28/2016 appt.

## 2016-03-11 NOTE — Progress Notes (Signed)
Mr. 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  Weight changes, if any: Wt Readings from Last 3 Encounters:  03/18/16 237 lb (107.5 kg)  02/28/16 239 lb 3.2 oz (108.5 kg)  02/06/16 240 lb 9.6 oz (109.1 kg)   Bowel/Bladder complaints, if [id number] normal bowel movements everyday. Nausea/Vomiting, if any: None Pain issues, if [id number] Skin changes: Skin normal color lower abdominal area. BP (!) 129/94   Pulse 74   Temp 98.3 F (36.8 C) (Oral)   Resp 18   Ht 5\' 11"  (1.803 m)   Wt 237 lb (107.5 kg)   SpO2 100%   BMI 33.05 kg/m

## 2016-03-18 ENCOUNTER — Ambulatory Visit
Admission: RE | Admit: 2016-03-18 | Discharge: 2016-03-18 | Disposition: A | Discharge status: Home / Self Care | Payer: PRIVATE HEALTH INSURANCE | Source: Ambulatory Visit | Attending: Radiation Oncology | Admitting: Radiation Oncology

## 2016-03-18 ENCOUNTER — Encounter: Payer: Self-pay | Admitting: Radiation Oncology

## 2016-03-18 VITALS — BP 129/94 | HR 74 | Temp 98.3°F | Resp 18 | Ht 71.0 in | Wt 237.0 lb

## 2016-03-18 DIAGNOSIS — Z923 Personal history of irradiation: Secondary | ICD-10-CM | POA: Diagnosis not present

## 2016-03-18 DIAGNOSIS — C187 Malignant neoplasm of sigmoid colon: Secondary | ICD-10-CM | POA: Insufficient documentation

## 2016-03-18 NOTE — Progress Notes (Signed)
  Radiation Oncology         [phone]) [phone] ________________________________  Name: BAYLIE STRUMPF MRN: [id number]  Date: 03/18/2016  DOB: 22-Jan-1976  Post Treatment Note  CC: Hayden Rasmussen., MD  [name], MD  Diagnosis:   Stage IIC, T4b, N0, M0 adenocarcinoma of the sigmoid colon.  Interval Since Last Radiation:  6 weeks   12/31/15-02/06/16: 1. Pelvis/ 45 Gy in 25 fractions  2. Pelvis boost/ 5.4 Gy in 3 fractions  Narrative:  The patient returns today for routine follow-up.  The patient tolerated radiotherapy very well with minimal nausea. He is not having any nausea at this time, and reports that he is having normal bowel movements every day. He denies any concerns with skin changes, and states that he has follow-up scheduled already for meeting with Dr. Benay Spice in May 2018.  He denies any rectal bleeding, abdominal pain, fevers or chills. No other complaints or verbalized.                             ALLERGIES:  has No Known Allergies.  Meds: Current Outpatient Prescriptions  Medication Sig Dispense Refill  . acetaminophen (TYLENOL) 500 MG tablet Take 1,000 mg by mouth every 6 (six) hours as needed (For pain.).     Marland Kitchen ibuprofen (ADVIL,MOTRIN) 200 MG tablet Take 200-400 mg by mouth every 6 (six) hours as needed (For pain.).    Marland Kitchen lidocaine-prilocaine (EMLA) cream Apply to port site one hour prior to use. Do not rub in, cover with plastic (Patient not taking: Reported on 03/18/2016) 30 g 1  . polyethylene glycol (MIRALAX / GLYCOLAX) packet Take 17 g by mouth daily as needed.    . pramoxine (PROCTOFOAM) 1 % foam Place 1 application rectally 3 (three) times daily as needed for irritation. (Patient not taking: Reported on 03/18/2016) 15 g 1   No current facility-administered medications for this encounter.     Physical Findings:  height is 5\' 11"  (1.803 m) and weight is 237 lb (107.5 kg). His oral temperature is 98.3 F (36.8 C). His blood pressure is 129/94 (abnormal) and  his pulse is 74. His respiration is 18 and oxygen saturation is 100%.  In general this is a well appearing Caucasian male in no acute distress. He's alert and oriented x4 and appropriate throughout the examination. Cardiopulmonary assessment is negative for acute distress and he exhibits normal effort.   Lab Findings: Lab Results  Component Value Date   WBC 4.6 02/28/2016   HGB 14.2 02/28/2016   HCT 40.1 02/28/2016   MCV 90.3 02/28/2016   PLT 173 02/28/2016     Radiographic Findings: No results found.  Impression/Plan: 1. Stage IIC, T4b, N0, M0 adenocarcinoma of the sigmoid colon.the patient is doing well following the completion of radiotherapy. He has plans to see Dr. Benay Spice in the spring, and was encouraged to reach out to Dr. Fuller Plan about resuming colonoscopy screening. We will plan to see him back in 12 months time to stagger his follow-up, and his CEA's will be ordered in the meantime by Dr. Benay Spice. I would be happy to order this prior to his next visit, and he will keep [place] informed any questions or concerns that arise prior to his next visit.     Carola Rhine, PAC

## 2016-03-19 ENCOUNTER — Telehealth: Payer: Self-pay | Admitting: *Deleted

## 2016-03-19 NOTE — Addendum Note (Signed)
Encounter addended by: Malena Edman, RN on: 03/19/2016  8:18 AM<BR>    Actions taken: Charge Capture section accepted

## 2016-03-19 NOTE — Telephone Encounter (Signed)
CALLED PATIENT TO INFORM OF FU APPT. FOR 03-18-17 @ 10:30 AM WITH ALISON PERKINS, LVM FOR A RETURN CALL

## 2016-03-26 ENCOUNTER — Encounter (HOSPITAL_COMMUNITY): Payer: Self-pay

## 2016-03-26 ENCOUNTER — Ambulatory Visit (HOSPITAL_COMMUNITY)
Admission: RE | Admit: 2016-03-26 | Discharge: 2016-03-26 | Disposition: A | Discharge status: Home / Self Care | Payer: PRIVATE HEALTH INSURANCE | Source: Ambulatory Visit | Attending: Oncology | Admitting: Oncology

## 2016-03-26 DIAGNOSIS — R918 Other nonspecific abnormal finding of lung field: Secondary | ICD-10-CM | POA: Diagnosis not present

## 2016-03-26 DIAGNOSIS — C187 Malignant neoplasm of sigmoid colon: Secondary | ICD-10-CM

## 2016-03-26 DIAGNOSIS — R590 Localized enlarged lymph nodes: Secondary | ICD-10-CM | POA: Insufficient documentation

## 2016-03-26 DIAGNOSIS — J439 Emphysema, unspecified: Secondary | ICD-10-CM | POA: Insufficient documentation

## 2016-03-26 MED ORDER — IOPAMIDOL (ISOVUE-300) INJECTION 61%
INTRAVENOUS | Status: AC
  Filled 2016-03-26: qty 75

## 2016-03-26 MED ORDER — IOPAMIDOL (ISOVUE-300) INJECTION 61%
75.0000 mL | Freq: Once | INTRAVENOUS | Status: AC | PRN
  Administered 2016-03-26: 75 mL via INTRAVENOUS

## 2016-03-27 ENCOUNTER — Telehealth: Payer: Self-pay | Admitting: *Deleted

## 2016-03-27 NOTE — Telephone Encounter (Signed)
Left message on voicemail informing pt of change in appt time. Requested he call office in AM to confirm.

## 2016-03-28 ENCOUNTER — Ambulatory Visit: Payer: PRIVATE HEALTH INSURANCE | Admitting: Oncology

## 2016-03-28 ENCOUNTER — Telehealth: Payer: Self-pay | Admitting: *Deleted

## 2016-03-28 NOTE — Telephone Encounter (Signed)
Call placed to patient to inform him per Dr. Benay Spice that we will need to reschedule his appt for today.  Pt informed per order of Dr. Benay Spice that his CT scan looks good.  Patient appreciative of information and has no questions at this time.  Message sent to schedulers.

## 2016-04-01 ENCOUNTER — Telehealth: Payer: Self-pay | Admitting: Oncology

## 2016-04-01 NOTE — Telephone Encounter (Signed)
confirmed r/s appt w/pt per LOS

## 2016-04-08 ENCOUNTER — Ambulatory Visit: Payer: PRIVATE HEALTH INSURANCE | Admitting: Oncology

## 2016-04-18 ENCOUNTER — Telehealth: Payer: Self-pay | Admitting: Oncology

## 2016-04-18 ENCOUNTER — Ambulatory Visit (HOSPITAL_BASED_OUTPATIENT_CLINIC_OR_DEPARTMENT_OTHER): Payer: PRIVATE HEALTH INSURANCE | Admitting: Oncology

## 2016-04-18 VITALS — BP 137/84 | HR 72 | Temp 98.0°F | Resp 19 | Ht 71.0 in | Wt 244.3 lb

## 2016-04-18 DIAGNOSIS — C187 Malignant neoplasm of sigmoid colon: Secondary | ICD-10-CM

## 2016-04-18 DIAGNOSIS — Z95828 Presence of other vascular implants and grafts: Secondary | ICD-10-CM

## 2016-04-18 DIAGNOSIS — Z85038 Personal history of other malignant neoplasm of large intestine: Secondary | ICD-10-CM

## 2016-04-18 MED ORDER — SODIUM CHLORIDE 0.9% FLUSH
10.0000 mL | INTRAVENOUS | Status: DC | PRN
  Filled 2016-04-18: qty 10

## 2016-04-18 MED ORDER — HEPARIN SOD (PORK) LOCK FLUSH 100 UNIT/ML IV SOLN
500.0000 [IU] | Freq: Once | INTRAVENOUS | Status: DC | PRN
  Filled 2016-04-18: qty 5

## 2016-04-18 NOTE — Telephone Encounter (Signed)
Patient given two bottles of contrast and instructions for CT scan appointments.

## 2016-04-18 NOTE — Progress Notes (Signed)
  [facility] OFFICE PROGRESS NOTE    Diagnosis: Colon cancer  INTERVAL HISTORY:   Marc King returns as scheduled. He feels well. No new complaint. A CT of the chest 03/26/2016 revealed no significant change in bilateral lung nodules compared to a CT from 08/28/2015. Stable borderline enlarged right hilar node.   Objective:  Vital signs in last 24 hours:  Blood pressure 137/84, pulse 72, temperature 98 F (36.7 C), temperature source Oral, resp. rate 19, height '5\' 11"'$  (1.803 m), weight 244 lb 4.8 oz (110.8 kg), SpO2 98 %.    HEENT: Neck without mass Lymphatics: No cervical, supraclavicular, axillary, or inguinal nodes Resp: Lungs clear bilaterally, distant breath sounds Cardio: Regular rate and rhythm GI: No hepatomegaly, no mass, nontender Vascular: No leg edema   Portacath/mild erythema surrounds Port-A-Cath, no tenderness  Lab Results: CEA on 02/28/2016-1.89  Imaging:  No results found.  Medications: I have reviewed the patient's current medications.  Assessment/Plan: 1. Adenocarcinoma the sigmoid colon, status post a colonoscopic biopsy 08/07/2015 ? CT abdomen/pelvis 07/05/2015 with masslike thickening of the sigmoid colon, adjacent lymphadenopathy, and prominent para-aortic and porta hepatis nodes ? CT chest 08/28/2015 with bilateral pulmonary nodules suspicious for metastases ? PET scan 09/05/2015 with sigmoid colon mass SUV max 18.4; numerous lymph nodes in the sigmoid mesocolon most of which are too small for PET resolution; 8 mm right external iliac chain lymph node SUV max 5.2; hypermetabolic lymph nodes tracking superiorly along the retroperitoneum extending to the aortocaval station at the level of the renal arteries; index lymph node at the level of the aortic bifurcation measures 12 mm with an SUV max of 4.5; hepato duodenal lymph node measures 10 mm with SUV max 3.6; no abnormal hypermetabolism in the liver, adrenal glands, spleen or pancreas. A  few scattered pulmonary nodules which do not show abnormal hypermetabolism, too small for PET resolution. ? 09/13/2015 status post laparoscopic sigmoidectomy and cystoscopy with bilateral stent placement ? Preserved expression of the major and minor MMR proteins. MSI-stable ? 7 cm adenocarcinoma of the sigmoid colon, grade 2; tumor invades through the colonic wall and adherent to the pelvic sidewall and anterior peritoneum; 28 benign lymph nodes ? CT biopsy of a hypermetabolic left retroperitoneal lymph node on 10/03/2015-pathology negative for malignancy ? Cycle 1 FOLFOX 10/11/2015 ? Cycle 2 FOLFOX 10/25/2015, Neulasta added ? Cycle 3 FOLFOX 11/08/2015 ? Cycle 4 FOLFOX 11/22/2015 ? Xeloda/radiation starting 12/31/2015, completed 02/06/2016 ? Marc King declined further chemotherapy ? Staging chest CT 03/26/2016-no change in bilateral lung nodules or a borderline right hilar node  2. History of Right lower abdominal pain secondary to #1  3. Mild neutropenia secondary to chemotherapy 10/25/2015. Neulasta added beginning with cycle 2.  4. History of Microcytic anemia. Question iron deficiency.  5. History of Thrombocytopenia secondary to chemotherapy   Disposition:  Marc King is in clinical remission from colon cancer. The restaging chest CT shows no evidence of disease progression. He will be referred for removal of the Port-A-Cath. I will refer him to Dr. Fuller Plan for a one-year surveillance colonoscopy. He will return for an office visit and restaging CTs in late May/early June.  Betsy Coder, MD  04/18/2016  9:21 AM

## 2016-04-18 NOTE — Telephone Encounter (Signed)
Appointments scheduled per 11/9 LOS. Patient given AVS report and calendars with future scheduled appointments. °

## 2016-05-01 ENCOUNTER — Other Ambulatory Visit: Payer: Self-pay | Admitting: Physician Assistant

## 2016-05-02 ENCOUNTER — Encounter (HOSPITAL_COMMUNITY): Payer: Self-pay

## 2016-05-02 ENCOUNTER — Ambulatory Visit (HOSPITAL_COMMUNITY)
Admission: RE | Admit: 2016-05-02 | Discharge: 2016-05-02 | Disposition: A | Discharge status: Home / Self Care | Payer: PRIVATE HEALTH INSURANCE | Source: Ambulatory Visit | Attending: Oncology | Admitting: Oncology

## 2016-05-02 DIAGNOSIS — Z452 Encounter for adjustment and management of vascular access device: Secondary | ICD-10-CM | POA: Insufficient documentation

## 2016-05-02 DIAGNOSIS — C187 Malignant neoplasm of sigmoid colon: Secondary | ICD-10-CM

## 2016-05-02 DIAGNOSIS — C189 Malignant neoplasm of colon, unspecified: Secondary | ICD-10-CM | POA: Diagnosis not present

## 2016-05-02 DIAGNOSIS — Z95828 Presence of other vascular implants and grafts: Secondary | ICD-10-CM

## 2016-05-02 HISTORY — PX: IR GENERIC HISTORICAL: IMG1180011

## 2016-05-02 LAB — CBC
HEMATOCRIT: 41.8 % (ref 39.0–52.0)
Hemoglobin: 15 g/dL (ref 13.0–17.0)
MCH: 31.6 pg (ref 26.0–34.0)
MCHC: 35.9 g/dL (ref 30.0–36.0)
MCV: 88.2 fL (ref 78.0–100.0)
PLATELETS: 209 10*3/uL (ref 150–400)
RBC: 4.74 MIL/uL (ref 4.22–5.81)
RDW: 12.1 % (ref 11.5–15.5)
WBC: 4.6 10*3/uL (ref 4.0–10.5)

## 2016-05-02 LAB — PROTIME-INR
INR: 1.04
Prothrombin Time: 13.6 seconds (ref 11.4–15.2)

## 2016-05-02 LAB — APTT: aPTT: 27 seconds (ref 24–36)

## 2016-05-02 MED ORDER — SODIUM CHLORIDE 0.9 % IV SOLN
INTRAVENOUS | Status: DC
  Administered 2016-05-02: 14:00:00 via INTRAVENOUS

## 2016-05-02 MED ORDER — CEFAZOLIN SODIUM-DEXTROSE 2-4 GM/100ML-% IV SOLN
2.0000 g | Freq: Once | INTRAVENOUS | Status: AC
  Administered 2016-05-02: 2 g via INTRAVENOUS
  Filled 2016-05-02 (×2): qty 100

## 2016-05-02 MED ORDER — LIDOCAINE-EPINEPHRINE (PF) 2 %-1:200000 IJ SOLN
INTRAMUSCULAR | Status: DC | PRN
  Administered 2016-05-02: 10 mL

## 2016-05-02 MED ORDER — LIDOCAINE-EPINEPHRINE (PF) 2 %-1:200000 IJ SOLN
INTRAMUSCULAR | Status: AC
  Filled 2016-05-02: qty 20

## 2016-05-02 NOTE — Discharge Instructions (Signed)
Sutured Wound Care Sutures are stitches that can be used to close wounds. Taking care of your wound properly can help prevent pain and infection. It can also help your wound to heal more quickly. How is this treated? Wound Care  Keep the wound clean and dry.  If you were given a bandage (dressing), change it at least one time per day or as told by your doctor. You should also change it if it gets wet or dirty.  Keep the wound completely dry for the first 24 hours or as told by your doctor. After that time, you may shower or bathe. However, make sure that the wound is not soaked in water until the sutures have been removed.  Clean the wound one time each day or as told by your doctor. ? Wash the wound with soap and water. ? Rinse the wound with water to remove all soap. ? Pat the wound dry with a clean towel. Do not rub the wound.  After cleaning the wound, put a thin layer of antibiotic ointment on it as told your doctor. This ointment: ? Helps to prevent infection. ? Keeps the bandage from sticking to the wound.  Have the sutures removed as told by your doctor. General Instructions  Take or apply medicines only as told by your doctor.  To help prevent scarring, make sure to cover your wound with sunscreen whenever you are outside after the sutures are removed and the wound is healed. Make sure to wear a sunscreen of at least 30 SPF.  If you were prescribed an antibiotic medicine or ointment, finish all of it even if you start to feel better.  Do not scratch or pick at the wound.  Keep all follow-up visits as told by your doctor. This is important.  Check your wound every day for signs of infection. Watch for: ? Redness, swelling, or pain. ? Fluid, blood, or pus.  Raise (elevate) the injured area above the level of your heart while you are sitting or lying down, if possible.  Avoid stretching your wound.  Drink enough fluids to keep your pee (urine) clear or pale  yellow. Contact a doctor if:  You were given a tetanus shot and you have any of these where the needle went in: ? Swelling. ? Very bad pain. ? Redness. ? Bleeding.  You have a fever.  A wound that was closed breaks open.  You notice a bad smell coming from the wound.  You notice something coming out of the wound, such as wood or glass.  Medicine does not help your pain.  You have any of these at the site of the wound. ? More redness. ? More swelling. ? More pain.  You have any of these coming from the wound. ? Fluid. ? Blood. ? Pus.  You notice a change in the color of your skin near the wound.  You need to change the bandage often due to fluid, blood, or pus coming from the wound.  You have a new rash.  You have numbness around the wound. Get help right away if:  You have very bad swelling around the wound.  Your pain suddenly gets worse and is very bad.  You have painful lumps near the wound or on skin that is anywhere on your body.  You have a red streak going away from the wound.  The wound is on your hand or foot and you cannot move a finger or toe like normal.  The   wound is on your hand or foot and you notice that your fingers or toes look pale or bluish. This information is not intended to replace advice given to you by your health care provider. Make sure you discuss any questions you have with your health care provider. Document Released: 09/03/2007 Document Revised: 08/23/2015 Document Reviewed: 10/27/2012 Elsevier Interactive Patient Education  2017 [employer]  

## 2016-05-02 NOTE — Procedures (Signed)
Colon ca  S/p RT IJ POWER PORT REMOVAL  No comp Stable Tip svcra Ready for use Full report in PACS

## 2016-05-12 ENCOUNTER — Encounter (HOSPITAL_COMMUNITY): Payer: Self-pay

## 2016-05-13 ENCOUNTER — Encounter (HOSPITAL_COMMUNITY): Payer: Self-pay

## 2016-06-24 ENCOUNTER — Encounter: Payer: Self-pay | Admitting: Gastroenterology

## 2016-08-04 ENCOUNTER — Ambulatory Visit (AMBULATORY_SURGERY_CENTER): Payer: Self-pay

## 2016-08-04 VITALS — Ht 71.0 in | Wt 253.4 lb

## 2016-08-04 DIAGNOSIS — Z85038 Personal history of other malignant neoplasm of large intestine: Secondary | ICD-10-CM

## 2016-08-04 MED ORDER — SUPREP BOWEL PREP KIT 17.5-3.13-1.6 GM/177ML PO SOLN: 1.0000 | Freq: Once | ORAL | 0 refills | Status: AC

## 2016-08-04 NOTE — Progress Notes (Signed)
No diet meds No home oxygen No past problems with anesthesia No allergies to eggs or soy  Registered emmi 

## 2016-08-08 ENCOUNTER — Encounter: Payer: Self-pay | Admitting: Gastroenterology

## 2016-08-18 ENCOUNTER — Encounter: Payer: Self-pay | Admitting: Gastroenterology

## 2016-08-18 ENCOUNTER — Ambulatory Visit (AMBULATORY_SURGERY_CENTER): Payer: PRIVATE HEALTH INSURANCE | Admitting: Gastroenterology

## 2016-08-18 VITALS — BP 110/64 | HR 62 | Temp 98.0°F | Resp 14 | Ht 71.0 in | Wt 253.0 lb

## 2016-08-18 DIAGNOSIS — D128 Benign neoplasm of rectum: Secondary | ICD-10-CM | POA: Diagnosis not present

## 2016-08-18 DIAGNOSIS — K56699 Other intestinal obstruction unspecified as to partial versus complete obstruction: Secondary | ICD-10-CM | POA: Diagnosis not present

## 2016-08-18 DIAGNOSIS — Z85038 Personal history of other malignant neoplasm of large intestine: Secondary | ICD-10-CM | POA: Diagnosis present

## 2016-08-18 DIAGNOSIS — D129 Benign neoplasm of anus and anal canal: Secondary | ICD-10-CM

## 2016-08-18 DIAGNOSIS — K621 Rectal polyp: Secondary | ICD-10-CM | POA: Diagnosis not present

## 2016-08-18 MED ORDER — SODIUM CHLORIDE 0.9 % IV SOLN: 500.0000 mL | INTRAVENOUS | Status: DC

## 2016-08-18 NOTE — Progress Notes (Signed)
Report given to PACU, vss 

## 2016-08-18 NOTE — Op Note (Signed)
[place] Patient Name: Marc King Procedure Date: 08/18/2016 1:46 PM MRN: [id number] Endoscopist: Ladene Artist , MD Age: 41 Referring MD:  Date of Birth: 04-25-1975 Gender: Male Account #: [id number] Procedure:                Colonoscopy Indications:              High risk colon cancer surveillance: Personal                            history of colon cancer Medicines:                Monitored Anesthesia Care Procedure:                Pre-Anesthesia Assessment:                           - Prior to the procedure, a History and Physical                            was performed, and patient medications and                            allergies were reviewed. The patient's tolerance of                            previous anesthesia was also reviewed. The risks                            and benefits of the procedure and the sedation                            options and risks were discussed with the patient.                            All questions were answered, and informed consent                            was obtained. Prior Anticoagulants: The patient has                            taken no previous anticoagulant or antiplatelet                            agents. ASA Grade Assessment: II - A patient with                            mild systemic disease. After reviewing the risks                            and benefits, the patient was deemed in                            satisfactory condition to undergo the procedure.  After obtaining informed consent, the colonoscope                            was passed under direct vision. Throughout the                            procedure, the patient's blood pressure, pulse, and                            oxygen saturations were monitored continuously. The                            Colonoscope was introduced through the anus and                            advanced to the stricture. The Model  [id number]                            [id number]) scope was introduced through the anus                            and advanced to the the cecum, identified by                            appendiceal orifice and ileocecal valve. The                            ileocecal valve, appendiceal orifice, and rectum                            were photographed. The quality of the bowel                            preparation was good. The colonoscopy was performed                            without difficulty. The patient tolerated the                            procedure well. Scope In: 1:53:30 PM Scope Out: 2:21:03 PM Scope Withdrawal Time: 0 hours 11 minutes 6 seconds  Total Procedure Duration: 0 hours 27 minutes 33 seconds  Findings:                 The perianal and digital rectal examinations were                            normal.                           A 5 mm polyp was found in the rectum. The polyp was                            sessile. The polyp was removed with a cold biopsy  forceps. Resection and retrieval were complete.                           A benign-appearing, intrinsic severe stenosis                            measuring less than 1 cm (in length) x 8 mm (inner                            diameter) was found at the anastomosis in the                            recto-sigmoid colon and was traversed after                            dilation. Pediatric colonoscope and upper endoscope                            could not pass before dilation. A TTS dilator was                            passed through the scope. Dilation with an                            8.5-9.5-10.5 mm and the upper endoscope could not                            pass so an 02-10-12 mm colonic balloon dilator was                            performed. The stricture was miminally dilated to                            about 10 mm and the upper endoscope was passed.                            There was evidence of a prior end-to-end                            colo-colonic anastomosis in the recto-sigmoid                            colon. This was characterized by severe stenosis.                            The anastomosis was traversed after dilation as                            above.                           The exam was otherwise without abnormality on  direct and retroflexion views. Complications:            No immediate complications. Estimated blood loss:                            None. Estimated Blood Loss:     Estimated blood loss: none. Impression:               - One 5 mm polyp in the rectum, removed with a cold                            biopsy forceps. Resected and retrieved.                           - The examination was otherwise normal on direct                            and retroflexion views.                           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 Dilated.                           - End-to-end colo-colonic anastomosis,                            characterized by severe stenosis. Recommendation:           - Repeat colonoscopy in 2 years for surveillance.                           - Patient has a contact number available for                            emergencies. The signs and symptoms of potential                            delayed complications were discussed with the                            patient. Return to normal activities tomorrow.                            Written discharge instructions were provided to the                            patient.                           - Resume previous diet.                           - Continue present medications.                           -  Await pathology results. Ladene Artist, MD 08/18/2016 2:32:27 PM This report has been signed electronically.

## 2016-08-18 NOTE — Patient Instructions (Signed)
Impression/Recommendations:  Polyp handout given to patient.  Repeat colonoscopy in 2 years for surveillance.  Resume previous diet. Continue present medications.  YOU HAD AN ENDOSCOPIC PROCEDURE TODAY AT [facility]:   Refer to the procedure report that was given to you for any specific questions about what was found during the examination.  If the procedure report does not answer your questions, please call your gastroenterologist to clarify.  If you requested that your care partner not be given the details of your procedure findings, then the procedure report has been included in a sealed envelope for you to review at your convenience later.  YOU SHOULD EXPECT: Some feelings of bloating in the abdomen. Passage of more gas than usual.  Walking can help get rid of the air that was put into your GI tract during the procedure and reduce the bloating. If you had a lower endoscopy (such as a colonoscopy or flexible sigmoidoscopy) you may notice spotting of blood in your stool or on the toilet paper. If you underwent a bowel prep for your procedure, you may not have a normal bowel movement for a few days.  Please Note:  You might notice some irritation and congestion in your nose or some drainage.  This is from the oxygen used during your procedure.  There is no need for concern and it should clear up in a day or so.  SYMPTOMS TO REPORT IMMEDIATELY:   Following lower endoscopy (colonoscopy or flexible sigmoidoscopy):  Excessive amounts of blood in the stool  Significant tenderness or worsening of abdominal pains  Swelling of the abdomen that is new, acute  Fever of 100F or higher  For urgent or emergent issues, a gastroenterologist can be reached at any hour by calling [phone].   DIET:  We do recommend a small meal at first, but then you may proceed to your regular diet.  Drink plenty of fluids but you should avoid alcoholic beverages for 24 hours.  ACTIVITY:  You  should plan to take it easy for the rest of today and you should NOT DRIVE or use heavy machinery until tomorrow (because of the sedation medicines used during the test).    FOLLOW UP: Our staff will call the number listed on your records the next business day following your procedure to check on you and address any questions or concerns that you may have regarding the information given to you following your procedure. If we do not reach you, we will leave a message.  However, if you are feeling well and you are not experiencing any problems, there is no need to return our call.  We will assume that you have returned to your regular daily activities without incident.  If any biopsies were taken you will be contacted by phone or by letter within the next 1-3 weeks.  Please call us at [phone] if you have not heard about the biopsies in 3 weeks.    SIGNATURES/CONFIDENTIALITY: You and/or your care partner have signed paperwork which will be entered into your electronic medical record.  These signatures attest to the fact that that the information above on your After Visit Summary has been reviewed and is understood.  Full responsibility of the confidentiality of this discharge information lies with you and/or your care-partner.

## 2016-08-18 NOTE — Progress Notes (Signed)
Pt's states no medical or surgical changes since previsit or office visit. 

## 2016-08-18 NOTE — Progress Notes (Signed)
Called to room to assist during endoscopic procedure.  Patient ID and intended procedure confirmed with present staff. Received instructions for my participation in the procedure from the performing physician.  

## 2016-08-19 ENCOUNTER — Telehealth: Payer: Self-pay

## 2016-08-19 ENCOUNTER — Telehealth: Payer: Self-pay | Admitting: *Deleted

## 2016-08-19 NOTE — Telephone Encounter (Signed)
  Follow up Call-  Call back number 08/18/2016 08/07/2015  Post procedure Call Back phone  # [phone]  Permission to leave phone message Yes Yes  Some recent data might be hidden     Patient questions:  Do you have a fever, pain , or abdominal swelling? No. Pain Score  0 *  Have you tolerated food without any problems? Yes.    Have you been able to return to your normal activities? Yes.    Do you have any questions about your discharge instructions: Diet   No. Medications  No. Follow up visit  No.  Do you have questions or concerns about your Care? No.  Actions: * If pain score is 4 or above: No action needed, pain <4.  No problems noted per pt. maw

## 2016-08-19 NOTE — Telephone Encounter (Signed)
Message left

## 2016-08-25 ENCOUNTER — Encounter: Payer: Self-pay | Admitting: Gastroenterology

## 2016-08-28 ENCOUNTER — Other Ambulatory Visit (HOSPITAL_BASED_OUTPATIENT_CLINIC_OR_DEPARTMENT_OTHER): Payer: PRIVATE HEALTH INSURANCE

## 2016-08-28 ENCOUNTER — Ambulatory Visit (HOSPITAL_COMMUNITY)
Admission: RE | Admit: 2016-08-28 | Discharge: 2016-08-28 | Disposition: A | Discharge status: Home / Self Care | Payer: PRIVATE HEALTH INSURANCE | Source: Ambulatory Visit | Attending: Oncology | Admitting: Oncology

## 2016-08-28 DIAGNOSIS — R918 Other nonspecific abnormal finding of lung field: Secondary | ICD-10-CM | POA: Diagnosis not present

## 2016-08-28 DIAGNOSIS — Z95828 Presence of other vascular implants and grafts: Secondary | ICD-10-CM

## 2016-08-28 DIAGNOSIS — K760 Fatty (change of) liver, not elsewhere classified: Secondary | ICD-10-CM | POA: Insufficient documentation

## 2016-08-28 DIAGNOSIS — Z85038 Personal history of other malignant neoplasm of large intestine: Secondary | ICD-10-CM | POA: Diagnosis not present

## 2016-08-28 DIAGNOSIS — C187 Malignant neoplasm of sigmoid colon: Secondary | ICD-10-CM

## 2016-08-28 DIAGNOSIS — I700 Atherosclerosis of aorta: Secondary | ICD-10-CM | POA: Diagnosis not present

## 2016-08-28 LAB — BASIC METABOLIC PANEL
Anion Gap: 11 mEq/L (ref 3–11)
BUN: 10.7 mg/dL (ref 7.0–26.0)
CHLORIDE: 103 meq/L (ref 98–109)
CO2: 25 meq/L (ref 22–29)
Calcium: 9.7 mg/dL (ref 8.4–10.4)
Creatinine: 1 mg/dL (ref 0.7–1.3)
EGFR: >90 mL/min/{1.73_m2} (ref >90)
GLUCOSE: 216 mg/dL — AB (ref 70–140)
POTASSIUM: 4.2 meq/L (ref 3.5–5.1)
Sodium: 139 mEq/L (ref 136–145)

## 2016-08-28 LAB — CEA (IN HOUSE-CHCC): CEA (CHCC-IN HOUSE): 2.06 ng/mL (ref 0.00–5.00)

## 2016-08-28 MED ORDER — IOPAMIDOL (ISOVUE-300) INJECTION 61%
100.0000 mL | Freq: Once | INTRAVENOUS | Status: AC | PRN
  Administered 2016-08-28: 100 mL via INTRAVENOUS

## 2016-08-28 MED ORDER — IOPAMIDOL (ISOVUE-300) INJECTION 61%
INTRAVENOUS | Status: AC
  Filled 2016-08-28: qty 100

## 2016-08-29 ENCOUNTER — Telehealth: Payer: Self-pay | Admitting: *Deleted

## 2016-08-29 LAB — CEA: CEA1: 3.7 ng/mL (ref 0.0–4.7)

## 2016-08-29 NOTE — Telephone Encounter (Signed)
-----   Message from [name], MD sent at 08/28/2016  8:43 PM EDT ----- Please call patient, cts show no evidence of recurrent cancer, f/u as scheduled

## 2016-08-29 NOTE — Telephone Encounter (Signed)
Notified pt of CT result, per MD note below. He voiced appreciation for call. Next appt confirmed. 

## 2016-09-05 ENCOUNTER — Telehealth: Payer: Self-pay | Admitting: Oncology

## 2016-09-05 ENCOUNTER — Ambulatory Visit (HOSPITAL_BASED_OUTPATIENT_CLINIC_OR_DEPARTMENT_OTHER): Payer: PRIVATE HEALTH INSURANCE | Admitting: Oncology

## 2016-09-05 VITALS — BP 130/90 | HR 71 | Temp 97.9°F | Resp 20 | Ht 71.0 in | Wt 246.8 lb

## 2016-09-05 DIAGNOSIS — Z85038 Personal history of other malignant neoplasm of large intestine: Secondary | ICD-10-CM

## 2016-09-05 DIAGNOSIS — C187 Malignant neoplasm of sigmoid colon: Secondary | ICD-10-CM

## 2016-09-05 NOTE — Telephone Encounter (Signed)
Scheduled appt per 6/8 los - Gave patient AVS and calender.  

## 2016-09-05 NOTE — Progress Notes (Signed)
[facility] OFFICE PROGRESS NOTE   Diagnosis: Colon cancer  INTERVAL HISTORY:   Marc King returns as scheduled. He feels well. Good appetite. No difficulty with bowel or bladder function. No complaint. He underwent a colonoscopy 08/18/2016. A 5 mm polyp was removed from the rectum. A benign-appearing stenosis was found at the rectosigmoid anastomosis. This was dilated. The pathology revealed a hyperplastic polyp. Neurologic symptoms have resolved. Minimal remaining numbness at the tip of 1 finger. This does not interfere with activity.  Objective:  Vital signs in last 24 hours:  Blood pressure 130/90, pulse 71, temperature 97.9 F (36.6 C), temperature source Oral, resp. rate 20, height 5\' 11"  (1.803 m), weight 246 lb 12.8 oz (111.9 kg), SpO2 98 %.    HEENT: Neck without mass Lymphatics: No cervical, supraclavicular, axillary, or inguinal nodes Resp: Lungs clear bilaterally Cardio: Regular rate and rhythm GI: No hepatosplenomegaly, no mass, nontender Vascular: No leg edema     Lab Results:  Lab Results  Component Value Date   WBC 4.6 05/02/2016   HGB 15.0 05/02/2016   HCT 41.8 05/02/2016   MCV 88.2 05/02/2016   PLT 209 05/02/2016   NEUTROABS 3.5 02/28/2016     Lab Results  Component Value Date   CEA1 2.06 08/28/2016   CEA1 3.7 08/28/2016    Medications: I have reviewed the patient's current medications.  Assessment/Plan: 1. Adenocarcinoma the sigmoid colon, status post a colonoscopic biopsy 08/07/2015  CT abdomen/pelvis 07/05/2015 with masslike thickening of the sigmoid colon, adjacent lymphadenopathy, and prominent para-aortic and porta hepatis nodes  CT chest 08/28/2015 with bilateral pulmonary nodules suspicious for metastases  PET scan 09/05/2015 with sigmoid colon mass SUV max 18.4; numerous lymph nodes in the sigmoid mesocolon most of which are too small for PET resolution; 8 mm right external iliac chain lymph node SUV max 5.2;  hypermetabolic lymph nodes tracking superiorly along the retroperitoneum extending to the aortocaval station at the level of the renal arteries; index lymph node at the level of the aortic bifurcation measures 12 mm with an SUV max of 4.5; hepato duodenal lymph node measures 10 mm with SUV max 3.6; no abnormal hypermetabolism in the liver, adrenal glands, spleen or pancreas. A few scattered pulmonary nodules which do not show abnormal hypermetabolism, too small for PET resolution.  09/13/2015 status post laparoscopic sigmoidectomy and cystoscopy with bilateral stent placement  Preserved expression of the major and minor MMR proteins. MSI-stable  7 cm adenocarcinoma of the sigmoid colon, grade 2; tumor invades through the colonic wall and adherent to the pelvic sidewall and anterior peritoneum; 28 benign lymph nodes  CT biopsy of a hypermetabolic left retroperitoneal lymph node on 10/03/2015-pathology negative for malignancy  Cycle 1 FOLFOX 10/11/2015  Cycle 2 FOLFOX 10/25/2015, Neulasta added  Cycle 3 FOLFOX 11/08/2015  Cycle 4 FOLFOX 11/22/2015  Xeloda/radiation starting 12/31/2015, completed 02/06/2016  Marc King declined further chemotherapy  Staging chest CT 03/26/2016-no change in bilateral lung nodules or a borderline right hilar node  Surveillance colonoscopy 08/18/2016-hyperplastic polyp removed from the rectum  CTs 08/28/2016-stable lung nodules, no evidence of metastatic disease  2. History of Right lower abdominal pain secondary to #1  3. Mild neutropenia secondary to chemotherapy 10/25/2015. Neulasta added beginning with cycle 2.  4. History of Microcytic anemia. Question iron deficiency.  5. History of Thrombocytopenia secondary to chemotherapy    Disposition:  Marc King is in clinical remission from colon cancer. He will return for an office visit and CEA in 6 months.  He will continue surveillance colonoscopy with Dr. Fuller Plan. We encouraged him to  follow-up with his primary physician for hypertension.  15 minutes were spent with the patient today. The majority of the time was used for counseling and coordination of care.  Donneta Romberg, MD  09/05/2016  8:53 AM

## 2017-03-18 ENCOUNTER — Inpatient Hospital Stay: Admission: RE | Admit: 2017-03-18 | Payer: Self-pay | Source: Ambulatory Visit | Admitting: Radiation Oncology

## 2017-03-18 ENCOUNTER — Telehealth: Payer: Self-pay | Admitting: Nurse Practitioner

## 2017-03-18 ENCOUNTER — Encounter: Payer: Self-pay | Admitting: Nurse Practitioner

## 2017-03-18 ENCOUNTER — Ambulatory Visit (HOSPITAL_BASED_OUTPATIENT_CLINIC_OR_DEPARTMENT_OTHER): Payer: PRIVATE HEALTH INSURANCE | Admitting: Nurse Practitioner

## 2017-03-18 ENCOUNTER — Other Ambulatory Visit (HOSPITAL_BASED_OUTPATIENT_CLINIC_OR_DEPARTMENT_OTHER): Payer: PRIVATE HEALTH INSURANCE

## 2017-03-18 VITALS — BP 153/99 | HR 63 | Temp 98.6°F | Resp 14 | Ht 71.0 in | Wt 245.6 lb

## 2017-03-18 DIAGNOSIS — Z85038 Personal history of other malignant neoplasm of large intestine: Secondary | ICD-10-CM

## 2017-03-18 DIAGNOSIS — C187 Malignant neoplasm of sigmoid colon: Secondary | ICD-10-CM

## 2017-03-18 LAB — CEA (IN HOUSE-CHCC): CEA (CHCC-IN HOUSE): 1.79 ng/mL (ref 0.00–5.00)

## 2017-03-18 NOTE — Progress Notes (Signed)
[facility] OFFICE PROGRESS NOTE   Diagnosis: Colon cancer  INTERVAL HISTORY:   Marc King returns as scheduled.  He feels well.  No complaints.  No change in bowel habits.  No bloody or black stools.  No pain.  He has a good appetite.  He reports his blood pressure is lower outside of our office.  Objective:  Vital signs in last 24 hours:  Blood pressure (!) 153/99, pulse 63, temperature 98.6 F (37 C), temperature source Oral, resp. rate 14, height '5\' 11"'$  (1.803 m), weight 245 lb 9.6 oz (111.4 kg), SpO2 100 %.    HEENT: Neck without mass. Lymphatics: No palpable cervical, supraclavicular, axillary or inguinal lymph nodes. Resp: Lungs clear bilaterally. Cardio: Regular rate and rhythm. GI: Abdomen soft and nontender.  No hepatomegaly. Vascular: No leg edema.    Lab Results:  Lab Results  Component Value Date   WBC 4.6 05/02/2016   HGB 15.0 05/02/2016   HCT 41.8 05/02/2016   MCV 88.2 05/02/2016   PLT 209 05/02/2016   NEUTROABS 3.5 02/28/2016    Imaging:  No results found.  Medications: I have reviewed the patient's current medications.  Assessment/Plan: 1. Adenocarcinoma the sigmoid colon, status post a colonoscopic biopsy 08/07/2015  CT abdomen/pelvis 07/05/2015 with masslike thickening of the sigmoid colon, adjacent lymphadenopathy, and prominent para-aortic and porta hepatis nodes  CT chest 08/28/2015 with bilateral pulmonary nodules suspicious for metastases  PET scan 09/05/2015 with sigmoid colon mass SUV max 18.4; numerous lymph nodes in the sigmoid mesocolon most of which are too small for PET resolution; 8 mm right external iliac chain lymph node SUV max 5.2; hypermetabolic lymph nodes tracking superiorly along the retroperitoneum extending to the aortocaval station at the level of the renal arteries; index lymph node at the level of the aortic bifurcation measures 12 mm with an SUV max of 4.5; hepato duodenal lymph node measures 10 mm with  SUV max 3.6; no abnormal hypermetabolism in the liver, adrenal glands, spleen or pancreas. A few scattered pulmonary nodules which do not show abnormal hypermetabolism, too small for PET resolution.  09/13/2015 status post laparoscopic sigmoidectomy and cystoscopy with bilateral stent placement  Preserved expression of the major and minor MMR proteins. MSI-stable  7 cm adenocarcinoma of the sigmoid colon, grade 2; tumor invades through the colonic wall and adherent to the pelvic sidewall and anterior peritoneum; 28 benign lymph nodes  CT biopsy of a hypermetabolic left retroperitoneal lymph node on 10/03/2015-pathology negative for malignancy  Cycle 1 FOLFOX 10/11/2015  Cycle 2 FOLFOX 10/25/2015, Neulasta added  Cycle 3 FOLFOX 11/08/2015  Cycle 4 FOLFOX 11/22/2015  Xeloda/radiation starting 12/31/2015, completed 02/06/2016  Marc King declined further chemotherapy  Staging chest CT 03/26/2016-no change in bilateral lung nodules or a borderline right hilar node  Surveillance colonoscopy 08/18/2016-hyperplastic polyp removed from the rectum  CTs 08/28/2016-stable lung nodules, no evidence of metastatic disease  2. History of Right lower abdominal pain secondary to #1  3. Mild neutropenia secondary to chemotherapy 10/25/2015. Neulasta added beginning with cycle 2.  4. History of Microcytic anemia. Question iron deficiency.  5. History of Thrombocytopenia secondary to chemotherapy     Disposition: Marc King remains in clinical remission from colon cancer.  We will follow-up on the CEA from today.  He will be scheduled for a CEA, CT scans and a follow-up visit in 6 months.  He will contact the office in the interim with any problems.  We discussed the elevated blood pressure.  I  encouraged him to contact his PCP for further discussion.    Ned Card ANP/GNP-BC   03/18/2017  10:27 AM

## 2017-03-18 NOTE — Telephone Encounter (Signed)
Scheduled appt per 12/19 los - Gave patient AVS and calender per los. - Central radiology to contact patient with ct schedule.

## 2017-03-19 ENCOUNTER — Telehealth: Payer: Self-pay | Admitting: Emergency Medicine

## 2017-03-19 NOTE — Telephone Encounter (Signed)
Pt read CEA results and told to follow up as scheduled per Ned Card NP. Pt verbalized understanding.

## 2017-03-30 ENCOUNTER — Ambulatory Visit: Payer: Self-pay | Admitting: Radiation Oncology

## 2017-04-09 NOTE — Progress Notes (Signed)
Marc King 42 y.o. man with malignant neoplasm of sigmoid colon radiation completed 02-06-16.  Weight changes, if IWP:YKDX Bowel/Bladder complaints, if IPJ:ASNK Nausea/Vomiting, if NLZ:JQBH Pain issues, if ALP:FXTK Skin changes:None   Vitals:   04/20/17 0823  BP: (!) 136/99  Pulse: 84  Resp: 18  Temp: 98.1 F (36.7 C)  TempSrc: Oral  SpO2: 95%  Weight: 240 lb 6 oz (109 kg)   Wt Readings from Last 3 Encounters:  04/20/17 240 lb 6 oz (109 kg)  03/18/17 245 lb 9.6 oz (111.4 kg)  09/05/16 246 lb 12.8 oz (111.9 kg)

## 2017-04-20 ENCOUNTER — Other Ambulatory Visit: Payer: Self-pay

## 2017-04-20 ENCOUNTER — Encounter: Payer: Self-pay | Admitting: Radiation Oncology

## 2017-04-20 ENCOUNTER — Ambulatory Visit
Admission: RE | Admit: 2017-04-20 | Discharge: 2017-04-20 | Disposition: A | Discharge status: Home / Self Care | Payer: PRIVATE HEALTH INSURANCE | Source: Ambulatory Visit | Attending: Radiation Oncology | Admitting: Radiation Oncology

## 2017-04-20 VITALS — BP 136/99 | HR 84 | Temp 98.1°F | Resp 18 | Wt 240.4 lb

## 2017-04-20 DIAGNOSIS — Z08 Encounter for follow-up examination after completed treatment for malignant neoplasm: Secondary | ICD-10-CM | POA: Diagnosis present

## 2017-04-20 DIAGNOSIS — C187 Malignant neoplasm of sigmoid colon: Secondary | ICD-10-CM

## 2017-04-20 DIAGNOSIS — Z800 Family history of malignant neoplasm of digestive organs: Secondary | ICD-10-CM | POA: Diagnosis not present

## 2017-04-20 DIAGNOSIS — Z85038 Personal history of other malignant neoplasm of large intestine: Secondary | ICD-10-CM | POA: Insufficient documentation

## 2017-04-20 DIAGNOSIS — Z87891 Personal history of nicotine dependence: Secondary | ICD-10-CM | POA: Diagnosis not present

## 2017-04-20 NOTE — Progress Notes (Signed)
Radiation Oncology         [phone]) [phone] ________________________________  Name: Marc King MRN: [id number]  Date: 04/20/2017  DOB: 1975/06/06  Post Treatment Note  CC: Marc Rasmussen, MD  Marc Pier, MD  Diagnosis:   Stage IIC, T4b, N0, M0 adenocarcinoma of the sigmoid colon.  Interval Since Last Radiation:  14 months 12/31/15-02/06/16: 1. Pelvis/ 45 Gy in 25 fractions  2. Pelvis boost/ 5.4 Gy in 3 fractions  Narrative:  Marc King is a pleasant 42 y.o. gentleman with a history of stage II colon cancer, who underwent surgical resection on 09/13/2015 where he underwent laparoscopic sigmoidectomy.  His surgical margins were noted as negative on the final report however his tumor was densely adherent to the peritoneum and the abdominal wall, and after discussion, he was offered adjuvant radiotherapy to the surgical bed.  He completed this in November 2017 and has been followed in surveillance since.  His CEA level has been between 1 and 2, and his most recent CT scans for staging purposes in May 2018 did not reveal any concerns for disease.  He has pulmonary nodules and the hilar lymph node that continue to be followed but stable.  He comes today for follow-up, and is scheduled to follow-up as well with Dr. Benay King in June.  Of note he did have to undergo dilatation of the sigmoid colon on 08/18/2016 after he was found to have increased abdominal bloating.  No disease was present.  On review of systems, the patient reports that he is doing well overall. He denies any chest pain, shortness of breath, cough, fevers, chills, night sweats, unintended weight changes. He denies any bowel or bladder disturbances, and denies abdominal pain, nausea or vomiting. He denies any bloating at this time as well.  He is not experiencing early satiety.  He reports that rarely if he is driving long stretches for work, he may note bladder spasms if he has had to hold his urine too long.  This only has  occurred once since his last visit. He denies any new musculoskeletal or joint aches or pains, new skin lesions or concerns. A complete review of systems is obtained and is otherwise negative.  Past Medical History:  Past Medical History:  Diagnosis Date  . Cancer of sigmoid colon ([place]) 08/07/2015  . Family history of colon cancer   . Scarlet fever     Past Surgical History: Past Surgical History:  Procedure Laterality Date  . CYSTOSCOPY WITH STENT PLACEMENT Bilateral 09/13/2015   Procedure: CYSTOSCOPY WITH BILATERAL STENT PLACEMENT;  Surgeon: Festus Aloe, MD;  Location: [facility];  Service: Urology;  Laterality: Bilateral;  . IR GENERIC HISTORICAL  05/02/2016   IR REMOVAL TUN ACCESS W/ PORT W/O FL MOD SED 05/02/2016 Greggory Keen, MD [facility]  . LAPAROSCOPIC PARTIAL COLECTOMY N/A 09/13/2015   Procedure: LAPAROSCOPIC SIGMOIDECTOMY;  Surgeon: Leighton Ruff, MD;  Location: [facility];  Service: General;  Laterality: N/A;  . PROCTOSCOPY  09/13/2015   Procedure: PROCTOSCOPY;  Surgeon: Leighton Ruff, MD;  Location: [facility];  Service: General;;  . VASECTOMY      Social History:  Social History   Socioeconomic History  . Marital status: Married    Spouse name: April  . Number of children: 2  . Years of education: Not on file  . Highest education level: Not on file  Social Needs  . Financial resource strain: Not on file  . Food insecurity - worry: Not on file  .  Food insecurity - inability: Not on file  . Transportation needs - medical: Not on file  . Transportation needs - non-medical: Not on file  Occupational History  . Occupation: Press photographer  Tobacco Use  . Smoking status: Former Smoker    Types: Cigarettes    Last attempt to quit: 03/31/2004    Years since quitting: 13.0  . Smokeless tobacco: Never Used  Substance and Sexual Activity  . Alcohol use: No    Alcohol/week: 0.0 oz    Comment: Quit in 2006  . Drug use: No    Comment: hx of 2005 marijuana use none in 11 years   . Sexual  activity: Yes  Other Topics Concern  . Not on file  Social History Narrative   Married, wife April   #2 children-ages 8/10 (homeschool)   Works in Press photographer for [employer]    Family History: Family History  Problem Relation Age of Onset  . Melanoma Father 35       on foot  . Clotting disorder Father   . Leukemia Mother 41       CLL  . Diabetes Mother   . Colitis Sister   . Irritable bowel syndrome Sister   . Celiac disease Sister   . Heart failure Maternal Grandmother        during surgery  . Anuerysm Paternal Grandmother        brain  . Anuerysm Maternal Grandfather        stomach  . Colon cancer Cousin 19       paternal first cousin's child  . Colon cancer Other        Maternal great grandmother dx in her 27s                                ALLERGIES:  has No Known Allergies.  Meds: Current Outpatient Medications  Medication Sig Dispense Refill  . acetaminophen (TYLENOL) 500 MG tablet Take 1,000 mg by mouth every 6 (six) hours as needed (For pain.).     Marland Kitchen ibuprofen (ADVIL,MOTRIN) 200 MG tablet Take 200-400 mg by mouth every 6 (six) hours as needed (For pain.).    Marland Kitchen OVER THE COUNTER MEDICATION Juice plus     Current Facility-Administered Medications  Medication Dose Route Frequency Provider Last Rate Last Dose  . 0.9 %  sodium chloride infusion  500 mL Intravenous Continuous Ladene Artist, MD        Physical Findings:  weight is 240 lb 6 oz (109 kg). His oral temperature is 98.1 F (36.7 C). His blood pressure is 136/99 (abnormal) and his pulse is 84. His respiration is 18 and oxygen saturation is 95%.   Pain scale 0/10 In general this is a well appearing Caucasian male in no acute distress. He is alert and oriented x4 and appropriate throughout the examination. HEENT reveals that the patient is normocephalic, atraumatic. EOMs are intact. PERRLA. Skin is intact without any evidence of gross lesions. Cardiovascular exam reveals a regular rate and rhythm, no clicks  rubs or murmurs are auscultated. Chest is clear to auscultation bilaterally. Lymphatic assessment is performed and does not reveal any adenopathy in the cervical, supraclavicular, axillary, or inguinal chains. Abdomen has active bowel sounds in all quadrants and is intact. The abdomen is soft, non tender, non distended. Lower extremities are negative for pretibial pitting edema, deep calf tenderness, cyanosis or clubbing.   Lab Findings: Lab Results  Component Value Date   WBC 4.6 05/02/2016   HGB 15.0 05/02/2016   HCT 41.8 05/02/2016   MCV 88.2 05/02/2016   PLT 209 05/02/2016     Radiographic Findings: No results found.  Impression/Plan: 1. Stage IIC, T4b, N0, M0 adenocarcinoma of the sigmoid colon. The patient appears to be clinically NED, and will follow up in June 2019 with Dr. Benay King for repeat CEA and CT scan.  At this point we discussed that I would be happy to continue to follow him as needed moving forward, and he is in agreement with this.  He will also continue his routine colonoscopy screening with Dr. Fuller Plan and is due for this next year.     Carola Rhine, PAC

## 2017-07-22 IMAGING — CT CT ABD-PELV W/ CM
1 of 5 series · 3 of 46 positions shown, 4 images · IV contrast (Iodine)
Comparison: None.

CLINICAL DATA: Right lower quadrant pain starting [REDACTED], fever,
nausea

EXAM:
CT ABDOMEN AND PELVIS WITH CONTRAST
TECHNIQUE: Multidetector CT imaging of the abdomen and pelvis was performed
using the standard protocol following bolus administration of
intravenous contrast.
CONTRAST:  100mL OMNIPAQUE IOHEXOL 300 MG/ML  SOLN

[Series 206: coronal · coronal · 0.45mm/px · 3 of 153 slices shown, 4 images]
[im 34/153  soft-tissue]
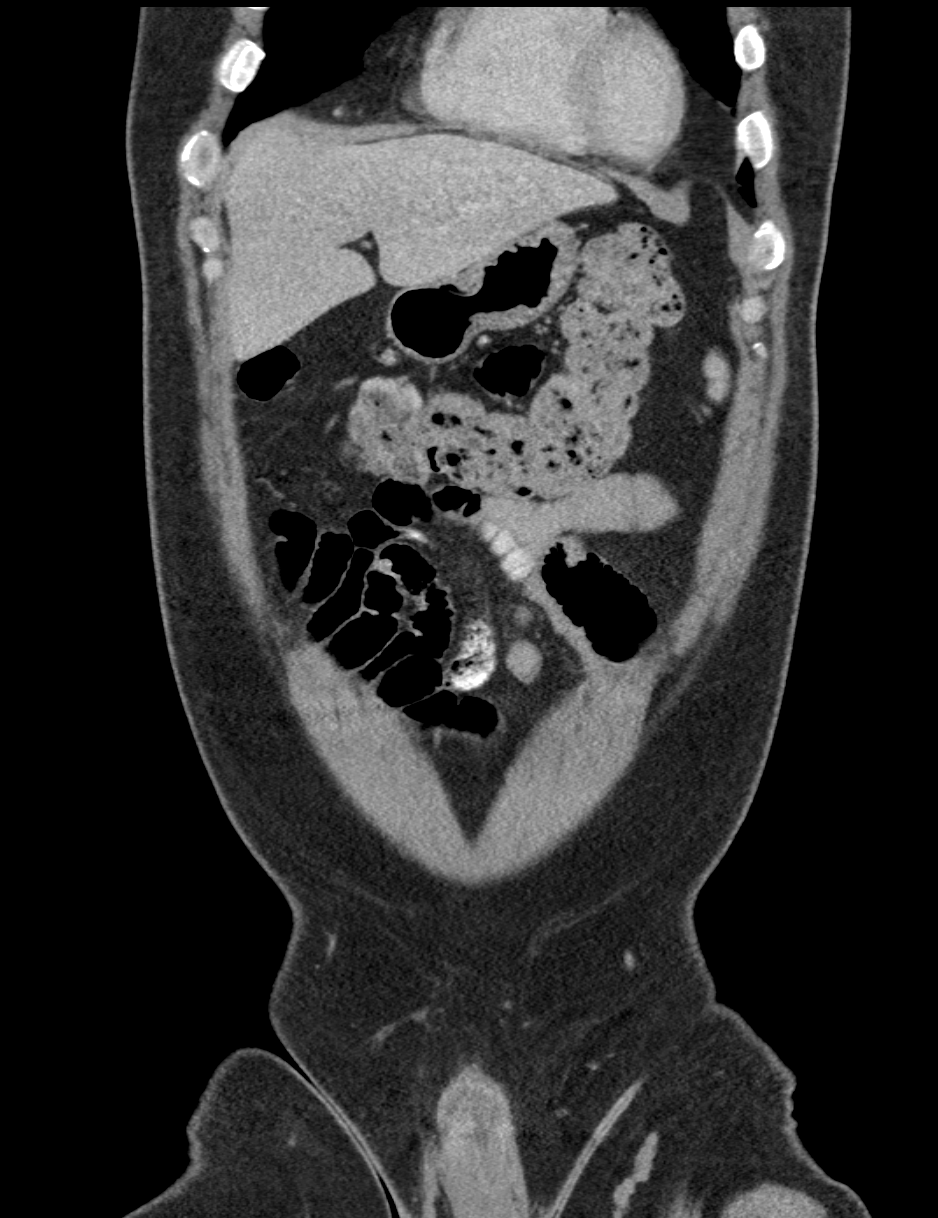
[im 34/153  bone]
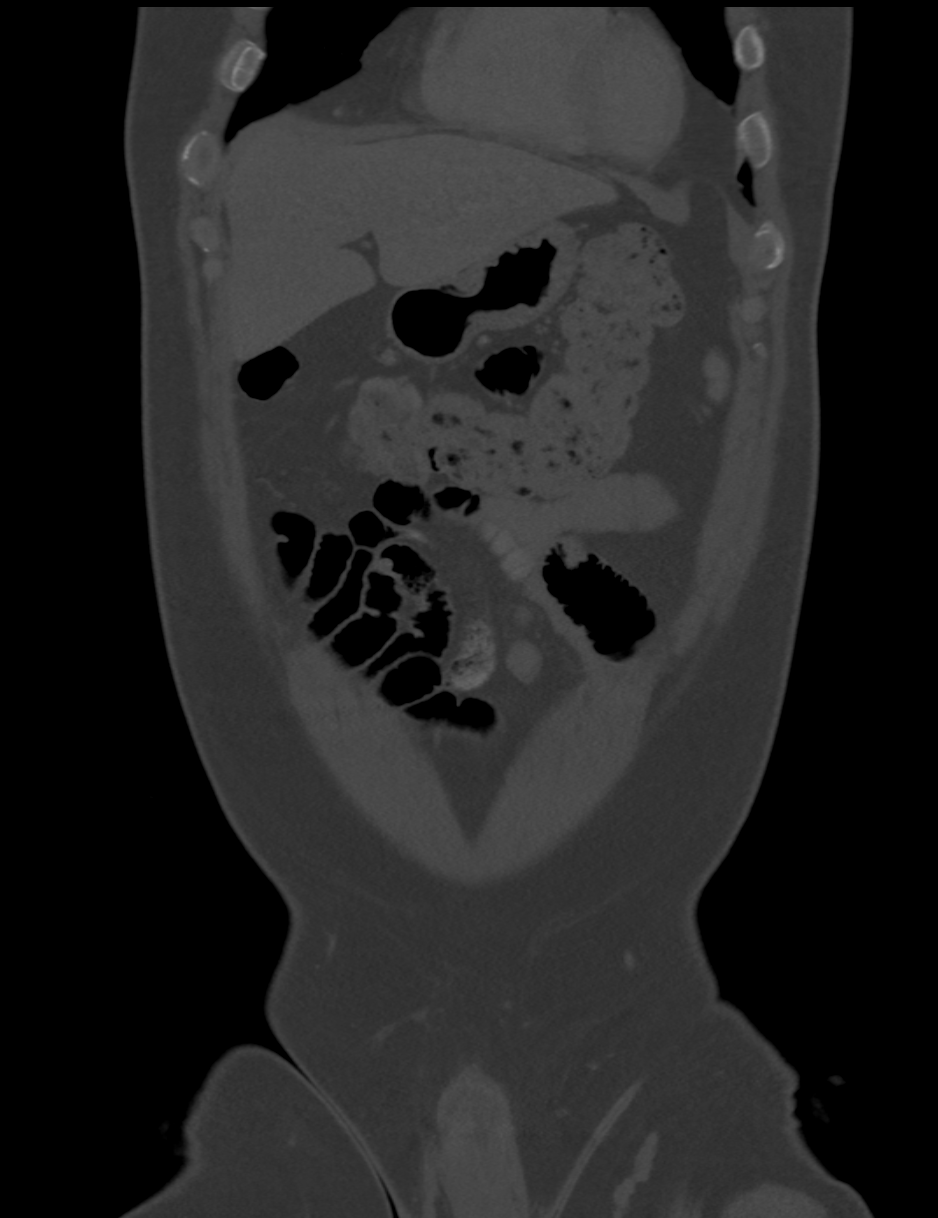
[im 85/153  soft-tissue]
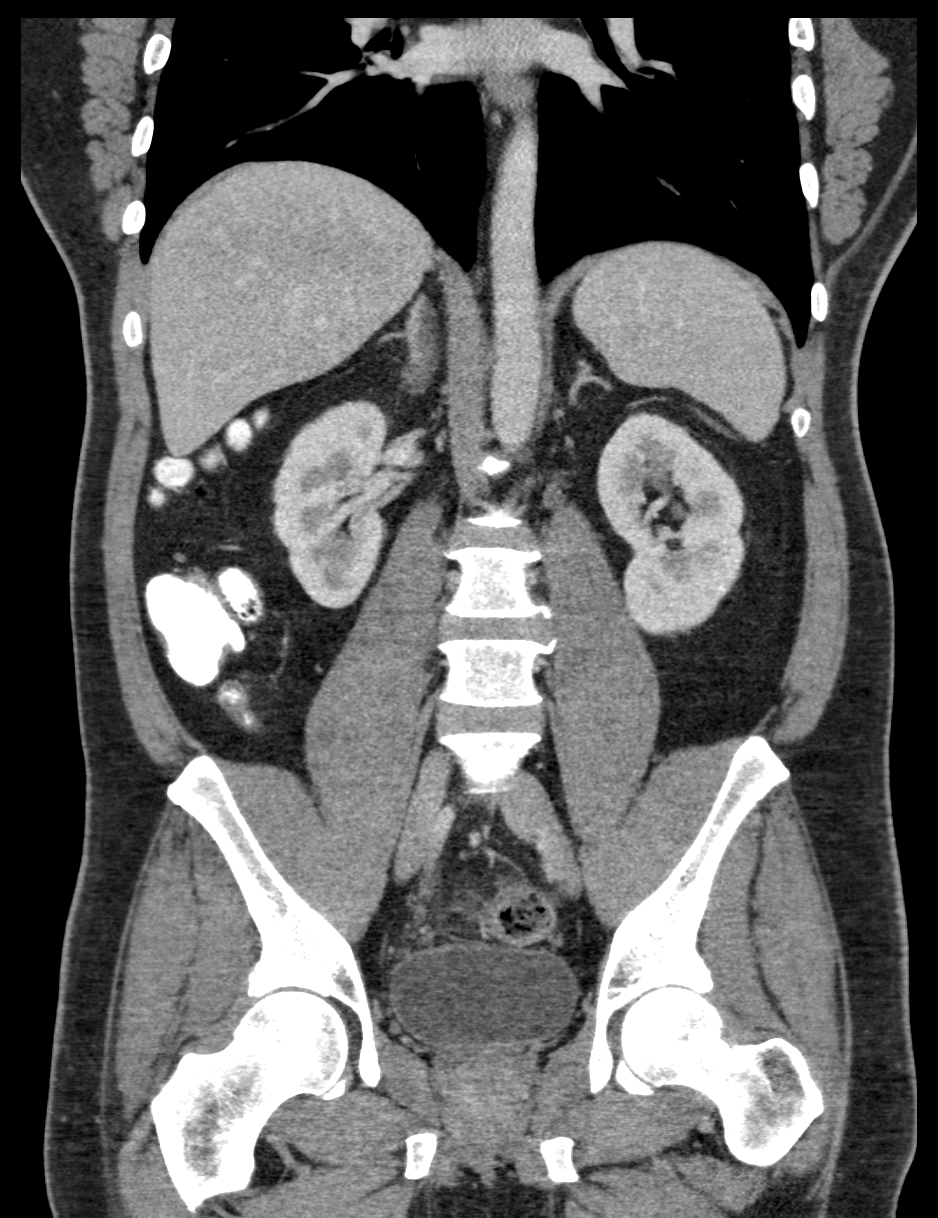
[im 119/153  soft-tissue]
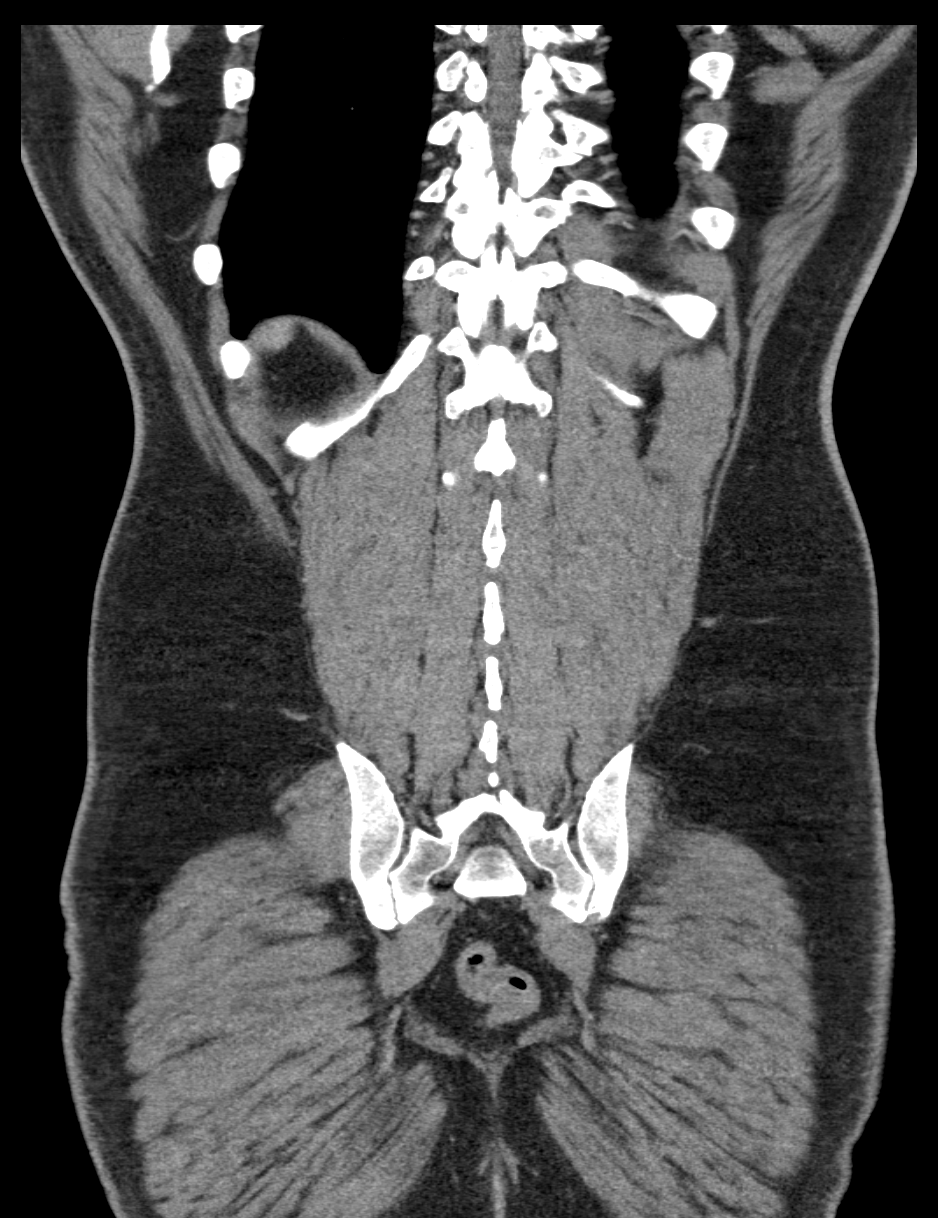

[3 of 46 positions shown; findings below may reference images not displayed]

FINDINGS: Lung bases are unremarkable. Sagittal images of the spine shows
degenerative changes thoracolumbar spine.

Liver, spleen, pancreas and adrenal glands are unremarkable. No
aortic aneurysm. No calcified gallstones are noted within
gallbladder.

Kidneys are symmetrical in size and enhancement. No hydronephrosis
or hydroureter. There is a cyst in midpole of the right kidney
measures 8 mm. Punctate nonobstructive calculus midpole of the left
kidney measures 2.5 mm. Normal appendix noted in axial image 44. No
pericecal inflammation. No small bowel obstruction. No ascites or
free air. No adenopathy.

In axial image 80 there is focal thickening of colonic wall in mid
sigmoid colon just above the urinary bladder. Significant stranding
of surrounding fat. Findings highly suspicious for focal colitis or
diverticulitis. Neoplastic process cannot be entirely excluded.
Follow-up colonoscopy after appropriate treatment is recommended. A
lymph node in adjacent mesentery right pelvis measures 1.2 cm. This
may be reactive in nature. Second lymph node in right mesentery
measures 9 mm.

Delayed renal images shows bilateral renal symmetrical excretion.
Bilateral visualized proximal ureter is unremarkable. The urinary
bladder is unremarkable. Prostate gland and seminal vesicles are
unremarkable.
IMPRESSION: 1. There is focal inflammatory process in mid sigmoid colon just
above the urinary bladder. There is focal thickening of colonic wall
and stranding of surrounding fat. Borderline enlarged adjacent
mesenteric lymph node. Small amount of adjacent pericolonic fluid.
Findings are highly suspicious for focal colitis or diverticulitis.
Neoplastic process cannot be entirely excluded. Follow-up
colonoscopy after appropriate treatment is recommended.
2. Normal appendix.  No pericecal inflammation.
3. Unremarkable urinary bladder.  Prostate gland is unremarkable.
4. There is left nonobstructive nephrolithiasis. No hydronephrosis
or hydroureter.

## 2017-08-31 ENCOUNTER — Inpatient Hospital Stay: Payer: PRIVATE HEALTH INSURANCE | Attending: Oncology

## 2017-08-31 ENCOUNTER — Ambulatory Visit (HOSPITAL_COMMUNITY)
Admission: RE | Admit: 2017-08-31 | Discharge: 2017-08-31 | Disposition: A | Discharge status: Home / Self Care | Payer: PRIVATE HEALTH INSURANCE | Source: Ambulatory Visit | Attending: Nurse Practitioner | Admitting: Nurse Practitioner

## 2017-08-31 DIAGNOSIS — C187 Malignant neoplasm of sigmoid colon: Secondary | ICD-10-CM | POA: Diagnosis present

## 2017-08-31 DIAGNOSIS — D509 Iron deficiency anemia, unspecified: Secondary | ICD-10-CM | POA: Insufficient documentation

## 2017-08-31 DIAGNOSIS — E119 Type 2 diabetes mellitus without complications: Secondary | ICD-10-CM | POA: Insufficient documentation

## 2017-08-31 DIAGNOSIS — N200 Calculus of kidney: Secondary | ICD-10-CM | POA: Diagnosis not present

## 2017-08-31 DIAGNOSIS — R918 Other nonspecific abnormal finding of lung field: Secondary | ICD-10-CM | POA: Diagnosis not present

## 2017-08-31 DIAGNOSIS — I700 Atherosclerosis of aorta: Secondary | ICD-10-CM | POA: Diagnosis not present

## 2017-08-31 DIAGNOSIS — K760 Fatty (change of) liver, not elsewhere classified: Secondary | ICD-10-CM | POA: Insufficient documentation

## 2017-08-31 DIAGNOSIS — Z85038 Personal history of other malignant neoplasm of large intestine: Secondary | ICD-10-CM | POA: Insufficient documentation

## 2017-08-31 LAB — COMPREHENSIVE METABOLIC PANEL
ALK PHOS: 38 U/L — AB (ref 40–150)
ALT: 24 U/L (ref 0–55)
AST: 21 U/L (ref 5–34)
Albumin: 4.2 g/dL (ref 3.5–5.0)
Anion gap: 9 (ref 3–11)
BILIRUBIN TOTAL: 1 mg/dL (ref 0.2–1.2)
BUN: 14 mg/dL (ref 7–26)
CO2: 25 mmol/L (ref 22–29)
CREATININE: 1 mg/dL (ref 0.70–1.30)
Calcium: 9.4 mg/dL (ref 8.4–10.4)
Chloride: 104 mmol/L (ref 98–109)
GFR calc Af Amer: >60 mL/min (ref >60)
Glucose, Bld: 185 mg/dL — ABNORMAL HIGH (ref 70–140)
POTASSIUM: 4.1 mmol/L (ref 3.5–5.1)
Sodium: 138 mmol/L (ref 136–145)
TOTAL PROTEIN: 7.2 g/dL (ref 6.4–8.3)

## 2017-08-31 LAB — CEA (IN HOUSE-CHCC): CEA (CHCC-IN HOUSE): 1.39 ng/mL (ref 0.00–5.00)

## 2017-08-31 MED ORDER — IOPAMIDOL (ISOVUE-300) INJECTION 61%
100.0000 mL | Freq: Once | INTRAVENOUS | Status: AC | PRN
  Administered 2017-08-31: 100 mL via INTRAVENOUS

## 2017-08-31 MED ORDER — IOPAMIDOL (ISOVUE-300) INJECTION 61%
INTRAVENOUS | Status: AC
  Filled 2017-08-31: qty 100

## 2017-09-01 ENCOUNTER — Telehealth: Payer: Self-pay | Admitting: Emergency Medicine

## 2017-09-01 NOTE — Telephone Encounter (Addendum)
Pt verbalized understanding of this   ----- Message from [name], MD sent at 09/01/2017  1:35 PM EDT ----- Please call patient, cts are negative for recurrent cancer, f/u as scheduled

## 2017-09-07 ENCOUNTER — Inpatient Hospital Stay (HOSPITAL_BASED_OUTPATIENT_CLINIC_OR_DEPARTMENT_OTHER): Payer: PRIVATE HEALTH INSURANCE | Admitting: Oncology

## 2017-09-07 ENCOUNTER — Telehealth: Payer: Self-pay | Admitting: Oncology

## 2017-09-07 VITALS — BP 137/89 | HR 68 | Temp 97.9°F | Resp 18 | Ht 71.0 in | Wt 232.0 lb

## 2017-09-07 DIAGNOSIS — Z85038 Personal history of other malignant neoplasm of large intestine: Secondary | ICD-10-CM | POA: Diagnosis not present

## 2017-09-07 DIAGNOSIS — E119 Type 2 diabetes mellitus without complications: Secondary | ICD-10-CM

## 2017-09-07 DIAGNOSIS — C187 Malignant neoplasm of sigmoid colon: Secondary | ICD-10-CM

## 2017-09-07 NOTE — Progress Notes (Signed)
[place] OFFICE PROGRESS NOTE   Diagnosis: Colon cancer  INTERVAL HISTORY:   Marc King returns as scheduled.  Restaging CT 08/31/2017 revealed stable pulmonary nodules.  No evidence of metastatic disease.  He feels well.  Good appetite and energy level.  He was recently diagnosed with diabetes and is on oral medication.  He has lost weight with a change in his diet.  Objective:  Vital signs in last 24 hours:  Blood pressure 137/89, pulse 68, temperature 97.9 F (36.6 C), temperature source Oral, resp. rate 18, height _0  (1.803 m), weight 232 lb (105.2 kg), SpO2 100 %.    HEENT: Neck without mass Lymphatics: No cervical, supraclavicular, axillary, or inguinal nodes Resp: Distant breath sounds, lungs clear bilaterally, no respiratory distress Cardio: Regular rate and rhythm GI: No hepatosplenomegaly, no mass, nontender Vascular: No leg edema     Lab Results:   CMP  Lab Results  Component Value Date   NA 138 08/31/2017   K 4.1 08/31/2017   CL 104 08/31/2017   CO2 25 08/31/2017   GLUCOSE 185 (H) 08/31/2017   BUN 14 08/31/2017   CREATININE 1.00 08/31/2017   CALCIUM 9.4 08/31/2017   PROT 7.2 08/31/2017   ALBUMIN 4.2 08/31/2017   AST 21 08/31/2017   ALT 24 08/31/2017   ALKPHOS 38 (L) 08/31/2017   BILITOT 1.0 08/31/2017   GFRNONAA >60 08/31/2017   GFRAA >60 08/31/2017    Lab Results  Component Value Date   CEA1 1.39 08/31/2017   Medications: I have reviewed the patient's current medications.   Assessment/Plan: 1.Adenocarcinoma the sigmoid colon, status post a colonoscopic biopsy 08/07/2015  CT abdomen/pelvis 07/05/2015 with masslike thickening of the sigmoid colon, adjacent lymphadenopathy, and prominent para-aortic and porta hepatis nodes  CT chest 08/28/2015 with bilateral pulmonary nodules suspicious for metastases  PET scan 09/05/2015 with sigmoid colon mass SUV max 18.4; numerous lymph nodes in the sigmoid mesocolon most of which are  too small for PET resolution; 8 mm right external iliac chain lymph node SUV max 5.2; hypermetabolic lymph nodes tracking superiorly along the retroperitoneum extending to the aortocaval station at the level of the renal arteries; index lymph node at the level of the aortic bifurcation measures 12 mm with an SUV max of 4.5; hepato duodenal lymph node measures 10 mm with SUV max 3.6; no abnormal hypermetabolism in the liver, adrenal glands, spleen or pancreas. A few scattered pulmonary nodules which do not show abnormal hypermetabolism, too small for PET resolution.  09/13/2015 status post laparoscopic sigmoidectomy and cystoscopy with bilateral stent placement  Preserved expression of the major and minor MMR proteins. MSI-stable  7 cm adenocarcinoma of the sigmoid colon, grade 2; tumor invades through the colonic wall and adherent to the pelvic sidewall and anterior peritoneum; 28 benign lymph nodes  CT biopsy of a hypermetabolic left retroperitoneal lymph node on 10/03/2015-pathology negative for malignancy  Cycle 1 FOLFOX 10/11/2015  Cycle 2 FOLFOX 10/25/2015, Neulasta added  Cycle 3 FOLFOX 11/08/2015  Cycle 4 FOLFOX 11/22/2015  Xeloda/radiation starting 12/31/2015, completed 02/06/2016  Marc King declined further chemotherapy  Staging chest CT 03/26/2016-no change in bilateral lung nodules or a borderline right hilar node  Surveillance colonoscopy 08/18/2016-hyperplastic polyp removed from the rectum  CTs 08/28/2016-stable lung nodules, no evidence of metastatic disease  CTs 08/31/2017-stable lung nodules, no evidence of metastatic disease  2. History of Right lower abdominal pain secondary to #1  3. Mild neutropenia secondary to chemotherapy 10/25/2015. Neulasta added beginning with cycle  2.  4. History of Microcytic anemia. Question iron deficiency.  5. History of Thrombocytopenia secondary to chemotherapy   Disposition: Marc King remains in clinical  remission from colon cancer.  He will return for an office visit and CEA in 6 months.  15 minutes were spent with the patient today.  The majority of the time was used for counseling and coordination of care.  Betsy Coder, MD  09/07/2017  8:27 AM

## 2017-09-07 NOTE — Telephone Encounter (Signed)
Scheduled appt per 6/10 los - Gave patient AVS and calender per los.

## 2017-12-31 ENCOUNTER — Other Ambulatory Visit: Payer: Self-pay | Admitting: Nurse Practitioner

## 2018-01-07 ENCOUNTER — Other Ambulatory Visit: Payer: Self-pay | Admitting: Nurse Practitioner

## 2018-03-08 ENCOUNTER — Telehealth: Payer: Self-pay | Admitting: *Deleted

## 2018-03-08 ENCOUNTER — Inpatient Hospital Stay: Payer: PRIVATE HEALTH INSURANCE | Attending: Nurse Practitioner | Admitting: Nurse Practitioner

## 2018-03-08 ENCOUNTER — Encounter: Payer: Self-pay | Admitting: Nurse Practitioner

## 2018-03-08 ENCOUNTER — Inpatient Hospital Stay: Payer: PRIVATE HEALTH INSURANCE

## 2018-03-08 ENCOUNTER — Telehealth: Payer: Self-pay

## 2018-03-08 VITALS — BP 129/92 | HR 70 | Temp 98.0°F | Resp 17 | Ht 71.0 in | Wt 236.3 lb

## 2018-03-08 DIAGNOSIS — C187 Malignant neoplasm of sigmoid colon: Secondary | ICD-10-CM

## 2018-03-08 DIAGNOSIS — Z85038 Personal history of other malignant neoplasm of large intestine: Secondary | ICD-10-CM | POA: Diagnosis not present

## 2018-03-08 DIAGNOSIS — D509 Iron deficiency anemia, unspecified: Secondary | ICD-10-CM | POA: Diagnosis not present

## 2018-03-08 DIAGNOSIS — D701 Agranulocytosis secondary to cancer chemotherapy: Secondary | ICD-10-CM | POA: Diagnosis not present

## 2018-03-08 DIAGNOSIS — D6959 Other secondary thrombocytopenia: Secondary | ICD-10-CM

## 2018-03-08 LAB — CEA (IN HOUSE-CHCC): CEA (CHCC-In House): 1.73 ng/mL (ref 0.00–5.00)

## 2018-03-08 NOTE — Telephone Encounter (Signed)
Printed avs and calender of upcoming appointment. Per 12/9 los. Gave contrast with CT information

## 2018-03-08 NOTE — Telephone Encounter (Signed)
Notified that CEA is in normal range. Follow up as scheduled.

## 2018-03-08 NOTE — Progress Notes (Addendum)
[place] OFFICE PROGRESS NOTE   Diagnosis: Colon cancer  INTERVAL HISTORY:   Marc King returns as scheduled.  He feels well.  No change in bowel habits.  No rectal bleeding.  No abdominal pain.  He has a good appetite.  No numbness in the hands or feet.  Objective:  Vital signs in last 24 hours:  Blood pressure (!) 129/92, pulse 70, temperature 98 F (36.7 C), temperature source Oral, resp. rate 17, height '5\' 11"'$  (1.803 m), weight 236 lb 4.8 oz (107.2 kg), SpO2 98 %.    HEENT: Neck without mass. Lymphatics: No palpable cervical, supraclavicular, axillary or inguinal lymph nodes. Resp: Lungs clear bilaterally. Cardio: Regular rate and rhythm. GI: Abdomen soft and nontender.  No hepatomegaly.  No mass. Vascular: No leg edema.  Lab Results:  Lab Results  Component Value Date   WBC 4.6 05/02/2016   HGB 15.0 05/02/2016   HCT 41.8 05/02/2016   MCV 88.2 05/02/2016   PLT 209 05/02/2016   NEUTROABS 3.5 02/28/2016    Imaging:  No results found.  Medications: I have reviewed the patient's current medications.  Assessment/Plan: 1.Adenocarcinoma the sigmoid colon, status post a colonoscopic biopsy 08/07/2015  CT abdomen/pelvis 07/05/2015 with masslike thickening of the sigmoid colon, adjacent lymphadenopathy, and prominent para-aortic and porta hepatis nodes  CT chest 08/28/2015 with bilateral pulmonary nodules suspicious for metastases  PET scan 09/05/2015 with sigmoid colon mass SUV max 18.4; numerous lymph nodes in the sigmoid mesocolon most of which are too small for PET resolution; 8 mm right external iliac chain lymph node SUV max 5.2; hypermetabolic lymph nodes tracking superiorly along the retroperitoneum extending to the aortocaval station at the level of the renal arteries; index lymph node at the level of the aortic bifurcation measures 12 mm with an SUV max of 4.5; hepato duodenal lymph node measures 10 mm with SUV max 3.6; no abnormal  hypermetabolism in the liver, adrenal glands, spleen or pancreas. A few scattered pulmonary nodules which do not show abnormal hypermetabolism, too small for PET resolution.  09/13/2015 status post laparoscopic sigmoidectomy and cystoscopy with bilateral stent placement  Preserved expression of the major and minor MMR proteins. MSI-stable  7 cm adenocarcinoma of the sigmoid colon, grade 2; tumor invades through the colonic wall and adherent to the pelvic sidewall and anterior peritoneum; 28 benign lymph nodes  CT biopsy of a hypermetabolic left retroperitoneal lymph node on 10/03/2015-pathology negative for malignancy  Cycle 1 FOLFOX 10/11/2015  Cycle 2 FOLFOX 10/25/2015, Neulasta added  Cycle 3 FOLFOX 11/08/2015  Cycle 4 FOLFOX 11/22/2015  Xeloda/radiation starting 12/31/2015, completed 02/06/2016  Marc King declined further chemotherapy  Staging chest CT 03/26/2016-no change in bilateral lung nodules or a borderline right hilar node  Surveillance colonoscopy 08/18/2016-hyperplastic polyp removed from the rectum  CTs 08/28/2016-stable lung nodules, no evidence of metastatic disease  CTs 08/31/2017-stable lung nodules, no evidence of metastatic disease  2. History of Right lower abdominal pain secondary to #1  3. Mild neutropenia secondary to chemotherapy 10/25/2015. Neulasta added beginning with cycle 2.  4. History of Microcytic anemia. Question iron deficiency.  5. History of Thrombocytopenia secondary to chemotherapy   Disposition: Marc King remains in clinical remission from colon cancer.  We will follow-up on the CEA from today.  He will return for lab and surveillance CT scans in approximately 6 months.  Follow-up appointment a few days later to review the results.  He will contact the office in the interim with any problems.  Patient seen with Dr. Benay Spice.    Ned Card ANP/GNP-BC   03/08/2018  9:18 AM  This was a shared visit with Ned Card.  Marc King is in clinical remission from colon cancer.  He will return for an office visit in 6 months.  Julieanne Manson, MD

## 2018-03-08 NOTE — Telephone Encounter (Signed)
-----   Message from Owens Shark, NP sent at 03/08/2018  4:04 PM EST ----- Please let him know the CEA is in normal range.  Follow-up as scheduled.

## 2018-03-10 ENCOUNTER — Telehealth: Payer: Self-pay | Admitting: *Deleted

## 2018-03-10 ENCOUNTER — Encounter: Payer: Self-pay | Admitting: Oncology

## 2018-03-10 NOTE — Telephone Encounter (Signed)
Wife left VM that there was trouble with his insurance card at visit this week. Requesting a call to discuss this. Forwarded a message to Stefanie Libel, financial advocate to call wife.

## 2018-03-10 NOTE — Progress Notes (Signed)
Received staff message from RN regarding insurance concern.  Called [employer] [place]([email or web address] [phone] to verify coverage. Coverage is active eff 01/29/18. No referrals needed and specialist copay of $50.  Added information to 03/08/18 visit to be filed to insurance. Received electronic verification.  Called spouse back and left voicemail with my contact name and number for any additional financial questions or concerns.

## 2018-05-09 IMAGING — US IR US GUIDE VASC ACCESS RIGHT
1 series · 2 of 2 positions shown · non-contrast
Comparison: none

INDICATION: 40-year-old male with a history of colon carcinoma. Patient presents
today for port catheter.

[Series 1: ir fluoro/shunt/fist · 2 of 2 slices shown]
[im 1/2]
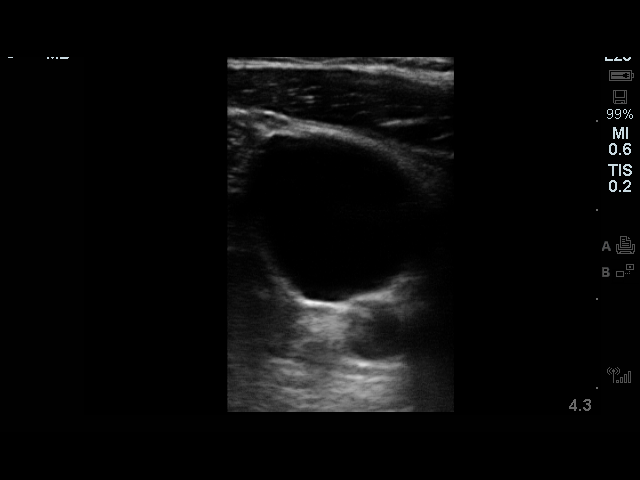
[im 2/2]
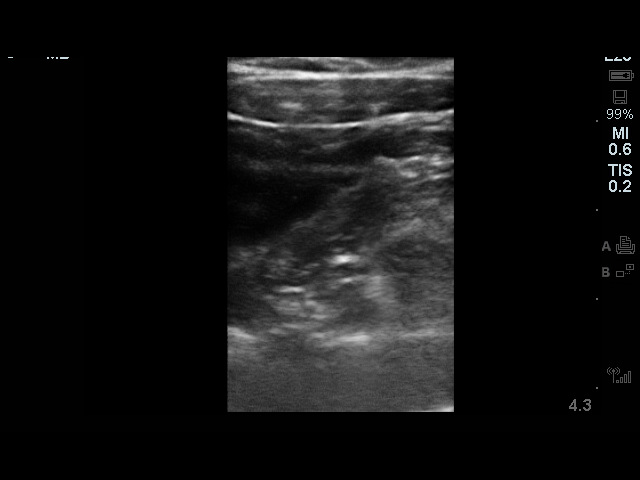

[2 of 2 positions shown; findings below may reference images not displayed]

EXAM:
IMPLANTED PORT A CATH PLACEMENT WITH ULTRASOUND AND FLUOROSCOPIC
GUIDANCE

MEDICATIONS:
2.0 g Ancef; The antibiotic was administered within an appropriate
time interval prior to skin puncture.

ANESTHESIA/SEDATION:
Moderate (conscious) sedation was employed during this procedure. A
total of Versed 1.0 mg and Fentanyl 25 mcg was administered
intravenously.

Moderate Sedation Time: 12 minutes. The patient's level of
consciousness and vital signs were monitored continuously by
radiology nursing throughout the procedure under my direct
supervision.

FLUOROSCOPY TIME:  Zero minutes, 48 seconds (8 mGy)

COMPLICATIONS:
None

PROCEDURE:
The procedure, risks, benefits, and alternatives were explained to
the patient. Questions regarding the procedure were encouraged and
answered. The patient understands and consents to the procedure.

Ultrasound survey was performed with images stored and sent to PACs.

The right neck and chest was prepped with chlorhexidine, and draped
in the usual sterile fashion using maximum barrier technique (cap
and mask, sterile gown, sterile gloves, large sterile sheet, hand
hygiene and cutaneous antiseptic). Antibiotic prophylaxis was
provided with 2.0g Ancef administered IV one hour prior to skin
incision. Local anesthesia was attained by infiltration with 1%
lidocaine without epinephrine.

Ultrasound demonstrated patency of the right internal jugular vein,
and this was documented with an image. Under real-time ultrasound
guidance, this vein was accessed with a 21 gauge micropuncture
needle and image documentation was performed. A small dermatotomy
was made at the access site with an 11 scalpel. A 0.018" wire was
advanced into the SVC and used to estimate the length of the
internal catheter. The access needle exchanged for a 4F
micropuncture vascular sheath. The 0.018" wire was then removed and
a 0.035" wire advanced into the IVC.



The venous access site was then serially dilated and a peel away
vascular sheath placed over the wire. The wire was removed and the
port catheter advanced into position under fluoroscopic guidance.
The catheter tip is positioned in the cavoatrial junction. This was
documented with a spot image. The portacatheter was then tested and
found to flush and aspirate well. The port was flushed with saline
followed by 100 units/mL heparinized saline.

The pocket was then closed in two layers using first subdermal
inverted interrupted absorbable sutures followed by a running
subcuticular suture. The epidermis was then sealed with Dermabond.
The dermatotomy at the venous access site was also seal with
Dermabond.

Patient tolerated the procedure well and remained hemodynamically
stable throughout.

No complications encountered and no significant blood loss
encountered
IMPRESSION: Status post right IJ port catheter placement. Catheter ready for
use.

## 2018-08-02 ENCOUNTER — Telehealth: Payer: Self-pay | Admitting: *Deleted

## 2018-08-02 NOTE — Telephone Encounter (Signed)
Notified by managed care in trying to obtain approval for CT scan ordered for June, that [name] are not in-network with his [employer]. Was informed that the insured must call to obtain what facility is in-network for radiology. Left this voice mail for patient and to call the patient information # on back of his card for an in-network provider and call nurse back with name of that facility.

## 2018-08-04 ENCOUNTER — Other Ambulatory Visit: Payer: Self-pay | Admitting: *Deleted

## 2018-08-04 DIAGNOSIS — C187 Malignant neoplasm of sigmoid colon: Secondary | ICD-10-CM

## 2018-08-04 NOTE — Progress Notes (Signed)
Per [name] in managed care, he can have his scan at GI at [facility] and not [facility]. Re-entered scan orders for this location. Left patient a message with telephone # for GI at [facility] to schedule his scan or to call and let us know if we need to schedule it for him?

## 2018-08-13 ENCOUNTER — Telehealth: Payer: Self-pay | Admitting: *Deleted

## 2018-08-13 NOTE — Telephone Encounter (Signed)
Called patient to follow up on his CT scan due prior to OV 09/09/18. He has had not had the time to call GI on [address] to reschedule this. He has the number and will do so.

## 2018-08-20 ENCOUNTER — Encounter: Payer: Self-pay | Admitting: Gastroenterology

## 2018-09-01 ENCOUNTER — Other Ambulatory Visit: Payer: Self-pay

## 2018-09-01 ENCOUNTER — Ambulatory Visit (AMBULATORY_SURGERY_CENTER): Payer: Self-pay

## 2018-09-01 VITALS — Ht 71.0 in | Wt 235.0 lb

## 2018-09-01 DIAGNOSIS — Z85038 Personal history of other malignant neoplasm of large intestine: Secondary | ICD-10-CM

## 2018-09-01 MED ORDER — NA SULFATE-K SULFATE-MG SULF 17.5-3.13-1.6 GM/177ML PO SOLN: 1.0000 | Freq: Once | ORAL | 0 refills | Status: AC

## 2018-09-01 NOTE — Progress Notes (Signed)
No egg or soy allergy known to patient  No issues with past sedation with any surgeries  or procedures, no intubation problems  No diet pills per patient No home 02 use per patient  No blood thinners per patient  Pt denies issues with constipation  No A fib or A flutter  EMMI video sent to pt's e mail  

## 2018-09-03 ENCOUNTER — Other Ambulatory Visit: Payer: Self-pay | Admitting: Oncology

## 2018-09-06 ENCOUNTER — Ambulatory Visit
Admission: RE | Admit: 2018-09-06 | Discharge: 2018-09-06 | Disposition: A | Discharge status: Home / Self Care | Payer: BLUE CROSS/BLUE SHIELD | Source: Ambulatory Visit | Attending: Oncology | Admitting: Oncology

## 2018-09-06 ENCOUNTER — Inpatient Hospital Stay: Payer: BLUE CROSS/BLUE SHIELD | Attending: Oncology

## 2018-09-06 ENCOUNTER — Ambulatory Visit (HOSPITAL_COMMUNITY): Payer: BLUE CROSS/BLUE SHIELD

## 2018-09-06 ENCOUNTER — Other Ambulatory Visit: Payer: Self-pay

## 2018-09-06 DIAGNOSIS — Z85038 Personal history of other malignant neoplasm of large intestine: Secondary | ICD-10-CM | POA: Diagnosis present

## 2018-09-06 DIAGNOSIS — D6959 Other secondary thrombocytopenia: Secondary | ICD-10-CM | POA: Insufficient documentation

## 2018-09-06 DIAGNOSIS — C187 Malignant neoplasm of sigmoid colon: Secondary | ICD-10-CM

## 2018-09-06 DIAGNOSIS — D701 Agranulocytosis secondary to cancer chemotherapy: Secondary | ICD-10-CM | POA: Diagnosis not present

## 2018-09-06 DIAGNOSIS — D509 Iron deficiency anemia, unspecified: Secondary | ICD-10-CM | POA: Diagnosis not present

## 2018-09-06 LAB — CMP (CANCER CENTER ONLY)
ALT: 20 U/L (ref 0–44)
AST: 20 U/L (ref 15–41)
Albumin: 4 g/dL (ref 3.5–5.0)
Alkaline Phosphatase: 37 U/L — ABNORMAL LOW (ref 38–126)
Anion gap: 11 (ref 5–15)
BUN: 20 mg/dL (ref 6–20)
CO2: 22 mmol/L (ref 22–32)
Calcium: 8.6 mg/dL — ABNORMAL LOW (ref 8.9–10.3)
Chloride: 104 mmol/L (ref 98–111)
Creatinine: 1.08 mg/dL (ref 0.61–1.24)
GFR, Est AFR Am: >60 mL/min (ref >60)
GFR, Estimated: >60 mL/min (ref >60)
Glucose, Bld: 225 mg/dL — ABNORMAL HIGH (ref 70–99)
Potassium: 4.5 mmol/L (ref 3.5–5.1)
Sodium: 137 mmol/L (ref 135–145)
Total Bilirubin: 0.6 mg/dL (ref 0.3–1.2)
Total Protein: 7.2 g/dL (ref 6.5–8.1)

## 2018-09-06 LAB — CEA (IN HOUSE-CHCC): CEA (CHCC-In House): 1.94 ng/mL (ref 0.00–5.00)

## 2018-09-06 MED ORDER — IOPAMIDOL (ISOVUE-300) INJECTION 61%
100.0000 mL | Freq: Once | INTRAVENOUS | Status: AC | PRN
  Administered 2018-09-06: 100 mL via INTRAVENOUS

## 2018-09-07 ENCOUNTER — Telehealth: Payer: Self-pay

## 2018-09-07 NOTE — Telephone Encounter (Signed)
Per Dr. Benay Spice TC to patient to let him know that CTs are negative for cancer, and to follow-up as scheduled. I let patient know that his next appointment is 09/09/18 with Dr Benay Spice at 12:00p. Patient verbalized understanding. No further problems or concerns at this time.

## 2018-09-09 ENCOUNTER — Other Ambulatory Visit: Payer: Self-pay

## 2018-09-09 ENCOUNTER — Inpatient Hospital Stay (HOSPITAL_BASED_OUTPATIENT_CLINIC_OR_DEPARTMENT_OTHER): Payer: BLUE CROSS/BLUE SHIELD | Admitting: Oncology

## 2018-09-09 VITALS — BP 130/88 | HR 65 | Temp 98.9°F | Resp 18 | Ht 71.0 in | Wt 240.4 lb

## 2018-09-09 DIAGNOSIS — D509 Iron deficiency anemia, unspecified: Secondary | ICD-10-CM | POA: Diagnosis not present

## 2018-09-09 DIAGNOSIS — Z85038 Personal history of other malignant neoplasm of large intestine: Secondary | ICD-10-CM

## 2018-09-09 DIAGNOSIS — D6959 Other secondary thrombocytopenia: Secondary | ICD-10-CM | POA: Diagnosis not present

## 2018-09-09 DIAGNOSIS — D701 Agranulocytosis secondary to cancer chemotherapy: Secondary | ICD-10-CM | POA: Diagnosis not present

## 2018-09-09 DIAGNOSIS — C187 Malignant neoplasm of sigmoid colon: Secondary | ICD-10-CM

## 2018-09-09 NOTE — Progress Notes (Signed)
[facility] OFFICE PROGRESS NOTE   Diagnosis: Colon cancer  INTERVAL HISTORY:   Mr. Shetley returns as scheduled.  He feels well.  Good appetite and energy level.  No difficulty with bowel function.  He is scheduled for a colonoscopy later this month.  Objective:  Vital signs in last 24 hours:  Blood pressure 130/88, pulse 65, temperature 98.9 F (37.2 C), temperature source Oral, resp. rate 18, height _0  (1.803 m), weight 240 lb 6.4 oz (109 kg), SpO2 99 %.   Limited physical examination secondary to distancing with the COVID pandemic Lymphatics: No cervical, supraclavicular, axillary, or inguinal nodes GI: No hepatosplenomegaly, nontender, no mass Vascular: No leg edema      Lab Results:  Lab Results  Component Value Date   WBC 4.6 05/02/2016   HGB 15.0 05/02/2016   HCT 41.8 05/02/2016   MCV 88.2 05/02/2016   PLT 209 05/02/2016   NEUTROABS 3.5 02/28/2016    CMP  Lab Results  Component Value Date   NA 137 09/06/2018   K 4.5 09/06/2018   CL 104 09/06/2018   CO2 22 09/06/2018   GLUCOSE 225 (H) 09/06/2018   BUN 20 09/06/2018   CREATININE 1.08 09/06/2018   CALCIUM 8.6 (L) 09/06/2018   PROT 7.2 09/06/2018   ALBUMIN 4.0 09/06/2018   AST 20 09/06/2018   ALT 20 09/06/2018   ALKPHOS 37 (L) 09/06/2018   BILITOT 0.6 09/06/2018   GFRNONAA >60 09/06/2018   GFRAA >60 09/06/2018    Lab Results  Component Value Date   CEA1 1.94 09/06/2018     Imaging:  Ct Chest W Contrast  Result Date: 09/06/2018 CLINICAL DATA:  Sigmoid colon cancer restaging. Partial colectomy. Prior chemotherapy and radiation therapy. EXAM: CT CHEST, ABDOMEN, AND PELVIS WITH CONTRAST TECHNIQUE: Multidetector CT imaging of the chest, abdomen and pelvis was performed following the standard protocol during bolus administration of intravenous contrast. CONTRAST:  12m ISOVUE-300 IOPAMIDOL (ISOVUE-300) INJECTION 61% COMPARISON:  08/31/2017 FINDINGS: CT CHEST FINDINGS Cardiovascular:  Unremarkable Mediastinum/Nodes: Right infrahilar node 0.8 cm in short axis on image 28/2, previously 0.9 cm by my measurement. Additional smaller bilateral infrahilar lymph nodes are observed similar to prior. None of these are pathologically enlarged. Lungs/Pleura: 3 mm right peribronchovascular nodule in the right upper lobe on image 30/4, stable. 0.6 by 0.4 cm right upper lobe nodule on image 42/4, stable. 1.5 by 0.8 cm nodule along the right major fissure on image 69/4, mildly reduced in size from prior measurement of 1.5 by 1.1 cm. Stable 0.6 by 0.5 cm left lower lobe nodule on image 96/4. Several other tiny nodules below 5 mm in diameter are stable. No new nodules are identified. Musculoskeletal: Mild thoracic spondylosis. CT ABDOMEN PELVIS FINDINGS Hepatobiliary: Unremarkable Pancreas: Unremarkable Spleen: Unremarkable Adrenals/Urinary Tract: Simple appearing cyst of the right mid to lower kidney laterally on image 71/2 common not appreciably changed. 2 mm nonobstructive left kidney upper pole renal calculus on image 118/6. 2 mm right kidney lower pole nonobstructive renal calculus, image 128/6. Borderline wall thickening of the roof of the urinary bladder probably from nondistention. Stomach/Bowel: Sigmoid colon anastomosis. Borderline prominence of stool in the colon. Vascular/Lymphatic: Aortoiliac atherosclerotic vascular disease. No pathologic adenopathy. Reproductive: Unremarkable Other: No supplemental non-categorized findings. Musculoskeletal: Unremarkable IMPRESSION: 1. No findings of active malignancy. 2. Scattered small pulmonary nodules remain unchanged. 3. Single bilateral tiny nonobstructive punctate renal calculi. 4. Borderline prominence of stool in the colon. 5.  Aortic Atherosclerosis (ICD10-I70.0). Electronically Signed   By:  Van Clines M.D.   On: 09/06/2018 13:26   Ct Abdomen Pelvis W Contrast  Result Date: 09/06/2018 CLINICAL DATA:  Sigmoid colon cancer restaging. Partial  colectomy. Prior chemotherapy and radiation therapy. EXAM: CT CHEST, ABDOMEN, AND PELVIS WITH CONTRAST TECHNIQUE: Multidetector CT imaging of the chest, abdomen and pelvis was performed following the standard protocol during bolus administration of intravenous contrast. CONTRAST:  164m ISOVUE-300 IOPAMIDOL (ISOVUE-300) INJECTION 61% COMPARISON:  08/31/2017 FINDINGS: CT CHEST FINDINGS Cardiovascular: Unremarkable Mediastinum/Nodes: Right infrahilar node 0.8 cm in short axis on image 28/2, previously 0.9 cm by my measurement. Additional smaller bilateral infrahilar lymph nodes are observed similar to prior. None of these are pathologically enlarged. Lungs/Pleura: 3 mm right peribronchovascular nodule in the right upper lobe on image 30/4, stable. 0.6 by 0.4 cm right upper lobe nodule on image 42/4, stable. 1.5 by 0.8 cm nodule along the right major fissure on image 69/4, mildly reduced in size from prior measurement of 1.5 by 1.1 cm. Stable 0.6 by 0.5 cm left lower lobe nodule on image 96/4. Several other tiny nodules below 5 mm in diameter are stable. No new nodules are identified. Musculoskeletal: Mild thoracic spondylosis. CT ABDOMEN PELVIS FINDINGS Hepatobiliary: Unremarkable Pancreas: Unremarkable Spleen: Unremarkable Adrenals/Urinary Tract: Simple appearing cyst of the right mid to lower kidney laterally on image 71/2 common not appreciably changed. 2 mm nonobstructive left kidney upper pole renal calculus on image 118/6. 2 mm right kidney lower pole nonobstructive renal calculus, image 128/6. Borderline wall thickening of the roof of the urinary bladder probably from nondistention. Stomach/Bowel: Sigmoid colon anastomosis. Borderline prominence of stool in the colon. Vascular/Lymphatic: Aortoiliac atherosclerotic vascular disease. No pathologic adenopathy. Reproductive: Unremarkable Other: No supplemental non-categorized findings. Musculoskeletal: Unremarkable IMPRESSION: 1. No findings of active malignancy.  2. Scattered small pulmonary nodules remain unchanged. 3. Single bilateral tiny nonobstructive punctate renal calculi. 4. Borderline prominence of stool in the colon. 5.  Aortic Atherosclerosis (ICD10-I70.0). Electronically Signed   By: WVan ClinesM.D.   On: 09/06/2018 13:26    Medications: I have reviewed the patient's current medications.   Assessment/Plan: 1.Adenocarcinoma the sigmoid colon, status post a colonoscopic biopsy 08/07/2015  CT abdomen/pelvis 07/05/2015 with masslike thickening of the sigmoid colon, adjacent lymphadenopathy, and prominent para-aortic and porta hepatis nodes  CT chest 08/28/2015 with bilateral pulmonary nodules suspicious for metastases  PET scan 09/05/2015 with sigmoid colon mass SUV max 18.4; numerous lymph nodes in the sigmoid mesocolon most of which are too small for PET resolution; 8 mm right external iliac chain lymph node SUV max 5.2; hypermetabolic lymph nodes tracking superiorly along the retroperitoneum extending to the aortocaval station at the level of the renal arteries; index lymph node at the level of the aortic bifurcation measures 12 mm with an SUV max of 4.5; hepato duodenal lymph node measures 10 mm with SUV max 3.6; no abnormal hypermetabolism in the liver, adrenal glands, spleen or pancreas. A few scattered pulmonary nodules which do not show abnormal hypermetabolism, too small for PET resolution.  09/13/2015 status post laparoscopic sigmoidectomy and cystoscopy with bilateral stent placement  Preserved expression of the major and minor MMR proteins. MSI-stable  7 cm adenocarcinoma of the sigmoid colon, grade 2; tumor invades through the colonic wall and adherent to the pelvic sidewall and anterior peritoneum; 28 benign lymph nodes  CT biopsy of a hypermetabolic left retroperitoneal lymph node on 10/03/2015-pathology negative for malignancy  Cycle 1 FOLFOX 10/11/2015  Cycle 2 FOLFOX 10/25/2015, Neulasta added  Cycle 3 FOLFOX  11/08/2015  Cycle 4 FOLFOX 11/22/2015  Xeloda/radiation starting 12/31/2015, completed 02/06/2016  Mr. Rueb declined further chemotherapy  Staging chest CT 03/26/2016-no change in bilateral lung nodules or a borderline right hilar node  Surveillance colonoscopy 08/18/2016-hyperplastic polyp removed from the rectum  CTs 08/28/2016-stable lung nodules, no evidence of metastatic disease  CTs 08/31/2017-stable lung nodules, no evidence of metastatic disease  CTs 09/06/2018- unchanged lung nodules, no evidence of metastatic disease  2. History of Right lower abdominal pain secondary to #1  3. Mild neutropenia secondary to chemotherapy 10/25/2015. Neulasta added beginning with cycle 2.  4. History of Microcytic anemia. Question iron deficiency.  5. History of Thrombocytopenia secondary to chemotherapy    Disposition: Marc King is in clinical remission from colon cancer.  He will return for an office visit and CEA in 6 months. He is scheduled for a surveillance colonoscopy this month.  Betsy Coder, MD  09/09/2018  12:25 PM

## 2018-09-10 ENCOUNTER — Telehealth: Payer: Self-pay | Admitting: Oncology

## 2018-09-10 ENCOUNTER — Encounter: Payer: Self-pay | Admitting: Gastroenterology

## 2018-09-10 NOTE — Telephone Encounter (Signed)
Scheduled appt per 6/11 los. A calendar will be mailed out. °

## 2018-09-20 ENCOUNTER — Telehealth: Payer: Self-pay | Admitting: Gastroenterology

## 2018-09-20 NOTE — Telephone Encounter (Signed)

## 2018-09-21 ENCOUNTER — Encounter: Payer: BLUE CROSS/BLUE SHIELD | Admitting: Gastroenterology

## 2018-09-21 ENCOUNTER — Other Ambulatory Visit: Payer: Self-pay

## 2018-09-21 ENCOUNTER — Ambulatory Visit (AMBULATORY_SURGERY_CENTER): Payer: BLUE CROSS/BLUE SHIELD | Admitting: Gastroenterology

## 2018-09-21 ENCOUNTER — Encounter: Payer: Self-pay | Admitting: Gastroenterology

## 2018-09-21 VITALS — BP 120/79 | HR 59 | Temp 98.6°F | Resp 11 | Ht 71.0 in | Wt 240.0 lb

## 2018-09-21 DIAGNOSIS — Z85038 Personal history of other malignant neoplasm of large intestine: Secondary | ICD-10-CM

## 2018-09-21 MED ORDER — SODIUM CHLORIDE 0.9 % IV SOLN: 500.0000 mL | Freq: Once | INTRAVENOUS | Status: DC

## 2018-09-21 NOTE — Op Note (Signed)
[facility] Patient Name: Marc King Procedure Date: 09/21/2018 10:05 AM MRN: [id number] Endoscopist: Ladene Artist , MD Age: 43 Referring MD:  Date of Birth: February 16, 1976 Gender: Male Account #: [id number] Procedure:                Colonoscopy Indications:              High risk colon cancer surveillance: Personal                            history of colon cancer Medicines:                Monitored Anesthesia Care Procedure:                Pre-Anesthesia Assessment:                           - Prior to the procedure, a History and Physical                            was performed, and patient medications and                            allergies were reviewed. The patient's tolerance of                            previous anesthesia was also reviewed. The risks                            and benefits of the procedure and the sedation                            options and risks were discussed with the patient.                            All questions were answered, and informed consent                            was obtained. Prior Anticoagulants: The patient has                            taken no previous anticoagulant or antiplatelet                            agents. ASA Grade Assessment: II - A patient with                            mild systemic disease. After reviewing the risks                            and benefits, the patient was deemed in                            satisfactory condition to undergo the procedure.  After obtaining informed consent, the colonoscope                            was passed under direct vision. Throughout the                            procedure, the patient's blood pressure, pulse, and                            oxygen saturations were monitored continuously. The                            Colonoscope was introduced through the anus and                            advanced to the the cecum, identified by                             appendiceal orifice and ileocecal valve. The                            ileocecal valve, appendiceal orifice, and rectum                            were photographed. The quality of the bowel                            preparation was good. The colonoscopy was performed                            without difficulty. The patient tolerated the                            procedure well. Scope In: 10:11:15 AM Scope Out: 10:23:01 AM Scope Withdrawal Time: 0 hours 8 minutes 48 seconds  Total Procedure Duration: 0 hours 11 minutes 46 seconds  Findings:                 The perianal and digital rectal examinations were                            normal.                           There was evidence of a prior end-to-end                            colo-colonic anastomosis in the recto-sigmoid                            colon. This was patent and was characterized by                            healthy appearing mucosa. The anastomosis was  traversed.                           A benign-appearing, intrinsic moderate stenosis                            measuring 1.1 cm (inner diameter) was found in the                            recto-sigmoid colon at the anastomosis and was                            traversed.                           Internal hemorrhoids were found during                            retroflexion. The hemorrhoids were small and Grade                            I (internal hemorrhoids that do not prolapse).                           The exam was otherwise without abnormality on                            direct and retroflexion views. Complications:            No immediate complications. Estimated blood loss:                            None. Estimated Blood Loss:     Estimated blood loss: none. Impression:               - Patent end-to-end colo-colonic anastomosis,                            characterized by healthy appearing  mucosa.                           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                           - Internal hemorrhoids.                           - The examination was otherwise normal on direct                            and retroflexion views.                           - No specimens collected. Recommendation:           - Repeat colonoscopy in 3 years for surveillance.                           -  Patient has a contact number available for                            emergencies. The signs and symptoms of potential                            delayed complications were discussed with the                            patient. Return to normal activities tomorrow.                            Written discharge instructions were provided to the                            patient.                           - Resume previous diet.                           - Continue present medications. Ladene Artist, MD 09/21/2018 10:27:16 AM This report has been signed electronically.

## 2018-09-21 NOTE — Progress Notes (Signed)
Pt's states no medical or surgical changes since previsit or office visit.  Temp taken by [place] V/S taken by JB

## 2018-09-21 NOTE — Patient Instructions (Signed)
Internal hemorrhoids noted today. Repeat colonoscopy in 3 years. Please note the 24/7 phone number to report any adverse symptoms circled in red below.    YOU HAD AN ENDOSCOPIC PROCEDURE TODAY AT [facility]:   Refer to the procedure report that was given to you for any specific questions about what was found during the examination.  If the procedure report does not answer your questions, please call your gastroenterologist to clarify.  If you requested that your care partner not be given the details of your procedure findings, then the procedure report has been included in a sealed envelope for you to review at your convenience later.  YOU SHOULD EXPECT: Some feelings of bloating in the abdomen. Passage of more gas than usual.  Walking can help get rid of the air that was put into your GI tract during the procedure and reduce the bloating. If you had a lower endoscopy (such as a colonoscopy or flexible sigmoidoscopy) you may notice spotting of blood in your stool or on the toilet paper. If you underwent a bowel prep for your procedure, you may not have a normal bowel movement for a few days.  Please Note:  You might notice some irritation and congestion in your nose or some drainage.  This is from the oxygen used during your procedure.  There is no need for concern and it should clear up in a day or so.  SYMPTOMS TO REPORT IMMEDIATELY:   Following lower endoscopy (colonoscopy or flexible sigmoidoscopy):  Excessive amounts of blood in the stool  Significant tenderness or worsening of abdominal pains  Swelling of the abdomen that is new, acute  Fever of 100F or higher   For urgent or emergent issues, a gastroenterologist can be reached at any hour by calling [phone].   DIET:  We do recommend a small meal at first, but then you may proceed to your regular diet.  Drink plenty of fluids but you should avoid alcoholic beverages for 24 hours.  ACTIVITY:  You should  plan to take it easy for the rest of today and you should NOT DRIVE or use heavy machinery until tomorrow (because of the sedation medicines used during the test).    FOLLOW UP: Our staff will call the number listed on your records 48-72 hours following your procedure to check on you and address any questions or concerns that you may have regarding the information given to you following your procedure. If we do not reach you, we will leave a message.  We will attempt to reach you two times.  During this call, we will ask if you have developed any symptoms of COVID 19. If you develop any symptoms (ie: fever, flu-like symptoms, shortness of breath, cough etc.) before then, please call [phone].  If you test positive for Covid 19 in the 2 weeks post procedure, please call and report this information to [place].    If any biopsies were taken you will be contacted by phone or by letter within the next 1-3 weeks.  Please call us at [phone] if you have not heard about the biopsies in 3 weeks.    SIGNATURES/CONFIDENTIALITY: You and/or your care partner have signed paperwork which will be entered into your electronic medical record.  These signatures attest to the fact that that the information above on your After Visit Summary has been reviewed and is understood.  Full responsibility of the confidentiality of this discharge information lies with you and/or your care-partner.

## 2018-09-21 NOTE — Progress Notes (Signed)
Report to PACU, RN, vss, BBS= Clear.  

## 2018-09-23 ENCOUNTER — Telehealth: Payer: Self-pay

## 2018-09-23 ENCOUNTER — Telehealth: Payer: Self-pay | Admitting: *Deleted

## 2018-09-23 NOTE — Telephone Encounter (Signed)
1. Have you developed a fever since your procedure? no  2.   Have you had an respiratory symptoms (SOB or cough) since your procedure? no  3.   Have you tested positive for COVID 19 since your procedure no  4.   Have you had any family members/close contacts diagnosed with the COVID 19 since your procedure?  no   If yes to any of these questions please route to Joylene John, RN and Alphonsa Gin, Therapist, sports.  Follow up Call-  Call back number 09/21/2018 08/18/2016  Post procedure Call Back phone  # [phone]  Permission to leave phone message Yes Yes  Some recent data might be hidden     Patient questions:  Do you have a fever, pain , or abdominal swelling? No. Pain Score  0 *  Have you tolerated food without any problems? Yes.    Have you been able to return to your normal activities? Yes.    Do you have any questions about your discharge instructions: Diet   No. Medications  No. Follow up visit  No.  Do you have questions or concerns about your Care? No.  Actions: * If pain score is 4 or above: No action needed, pain <4.

## 2018-09-23 NOTE — Telephone Encounter (Signed)
Follow up call to pt after endoscopy procedure.  Left message for pt to call if any problems eating or drinking, if any fever or if he is not back to his normal activities.  Also please call if he is having covid 19 exposure or symptoms.

## 2018-09-24 ENCOUNTER — Telehealth: Payer: Self-pay | Admitting: Gastroenterology

## 2018-09-24 NOTE — Telephone Encounter (Signed)
Pt had colonoscopy 09/21/18 and "had a question for a nurse."

## 2018-09-27 NOTE — Telephone Encounter (Signed)
Left message for patient to call back  

## 2018-09-27 NOTE — Telephone Encounter (Signed)
Pt called back to let you know he was feeling a lot better,  Thank you.

## 2019-03-11 ENCOUNTER — Inpatient Hospital Stay: Payer: BLUE CROSS/BLUE SHIELD | Attending: Nurse Practitioner

## 2019-03-11 ENCOUNTER — Inpatient Hospital Stay (HOSPITAL_BASED_OUTPATIENT_CLINIC_OR_DEPARTMENT_OTHER): Payer: BLUE CROSS/BLUE SHIELD | Admitting: Nurse Practitioner

## 2019-03-11 ENCOUNTER — Other Ambulatory Visit: Payer: Self-pay

## 2019-03-11 ENCOUNTER — Telehealth: Payer: Self-pay | Admitting: *Deleted

## 2019-03-11 ENCOUNTER — Encounter: Payer: Self-pay | Admitting: Nurse Practitioner

## 2019-03-11 VITALS — BP 145/97 | HR 66 | Temp 98.0°F | Resp 17 | Ht 71.0 in | Wt 244.9 lb

## 2019-03-11 DIAGNOSIS — C187 Malignant neoplasm of sigmoid colon: Secondary | ICD-10-CM | POA: Diagnosis not present

## 2019-03-11 DIAGNOSIS — Z85038 Personal history of other malignant neoplasm of large intestine: Secondary | ICD-10-CM | POA: Insufficient documentation

## 2019-03-11 LAB — CEA (IN HOUSE-CHCC): CEA (CHCC-In House): 1.98 ng/mL (ref 0.00–5.00)

## 2019-03-11 NOTE — Progress Notes (Signed)
[facility] OFFICE PROGRESS NOTE   Diagnosis: Colon cancer  INTERVAL HISTORY:   Marc King returns as scheduled.  He feels well.  No change in bowel habits.  No bleeding.  He reports a good appetite.  No pain.  Objective:  Vital signs in last 24 hours:  Blood pressure (!) 145/97, pulse 66, temperature 98 F (36.7 C), temperature source Temporal, resp. rate 17, height '5\' 11"'$  (1.803 m), weight 244 lb 14.4 oz (111.1 kg), SpO2 99 %.    HEENT: Neck without mass. Lymphatics: No palpable cervical, supraclavicular, axillary or inguinal lymph nodes. Resp: Lungs clear bilaterally. Cardio: Regular rate and rhythm. GI: Abdomen soft and nontender.  No hepatomegaly. Vascular: No leg edema.  Lab Results:  Lab Results  Component Value Date   WBC 4.6 05/02/2016   HGB 15.0 05/02/2016   HCT 41.8 05/02/2016   MCV 88.2 05/02/2016   PLT 209 05/02/2016   NEUTROABS 3.5 02/28/2016    Imaging:  No results found.  Medications: I have reviewed the patient's current medications.  Assessment/Plan: 1.Adenocarcinoma the sigmoid colon, status post a colonoscopic biopsy 08/07/2015  CT abdomen/pelvis 07/05/2015 with masslike thickening of the sigmoid colon, adjacent lymphadenopathy, and prominent para-aortic and porta hepatis nodes  CT chest 08/28/2015 with bilateral pulmonary nodules suspicious for metastases  PET scan 09/05/2015 with sigmoid colon mass SUV max 18.4; numerous lymph nodes in the sigmoid mesocolon most of which are too small for PET resolution; 8 mm right external iliac chain lymph node SUV max 5.2; hypermetabolic lymph nodes tracking superiorly along the retroperitoneum extending to the aortocaval station at the level of the renal arteries; index lymph node at the level of the aortic bifurcation measures 12 mm with an SUV max of 4.5; hepato duodenal lymph node measures 10 mm with SUV max 3.6; no abnormal hypermetabolism in the liver, adrenal glands, spleen or pancreas.  A few scattered pulmonary nodules which do not show abnormal hypermetabolism, too small for PET resolution.  09/13/2015 status post laparoscopic sigmoidectomy and cystoscopy with bilateral stent placement  Preserved expression of the major and minor MMR proteins. MSI-stable  7 cm adenocarcinoma of the sigmoid colon, grade 2; tumor invades through the colonic wall and adherent to the pelvic sidewall and anterior peritoneum; 28 benign lymph nodes  CT biopsy of a hypermetabolic left retroperitoneal lymph node on 10/03/2015-pathology negative for malignancy  Cycle 1 FOLFOX 10/11/2015  Cycle 2 FOLFOX 10/25/2015, Neulasta added  Cycle 3 FOLFOX 11/08/2015  Cycle 4 FOLFOX 11/22/2015  Xeloda/radiation starting 12/31/2015, completed 02/06/2016  Marc King declined further chemotherapy  Staging chest CT 03/26/2016-no change in bilateral lung nodules or a borderline right hilar node  Surveillance colonoscopy 08/18/2016-hyperplastic polyp removed from the rectum  CTs 08/28/2016-stable lung nodules, no evidence of metastatic disease  CTs 08/31/2017-stable lung nodules, no evidence of metastatic disease  CTs 09/06/2018- unchanged lung nodules, no evidence of metastatic disease  2. History of Right lower abdominal pain secondary to #1  3. Mild neutropenia secondary to chemotherapy 10/25/2015. Neulasta added beginning with cycle 2.  4. History of Microcytic anemia. Question iron deficiency.  5. History of Thrombocytopenia secondary to chemotherapy   Disposition: Marc King remains in clinical remission from colon cancer.  We will follow-up on the CEA from today.  He will return for a CEA and follow-up visit in 6 months.  His blood pressure was elevated multiple times in the office today.  He will follow-up with his PCP.    Ned Card ANP/GNP-BC  03/11/2019  10:54 AM

## 2019-03-11 NOTE — Telephone Encounter (Signed)
-----   Message from Owens Shark, NP sent at 03/11/2019  2:22 PM EST ----- Please let him know the CEA is normal.  Follow-up as scheduled.

## 2019-03-11 NOTE — Telephone Encounter (Signed)
Per Ned Card, NP, called and made pt aware that his CEA results were normal and to f/u as scheduled. Pt verbalized understanding.

## 2019-03-14 ENCOUNTER — Telehealth: Payer: Self-pay | Admitting: Oncology

## 2019-03-14 NOTE — Telephone Encounter (Signed)
Scheduled per los. Called and spoke with patient. Confirmed appt 

## 2019-09-09 ENCOUNTER — Other Ambulatory Visit: Payer: Self-pay

## 2019-09-09 ENCOUNTER — Inpatient Hospital Stay: Payer: BLUE CROSS/BLUE SHIELD | Attending: Oncology | Admitting: Oncology

## 2019-09-09 ENCOUNTER — Telehealth: Payer: Self-pay | Admitting: Oncology

## 2019-09-09 ENCOUNTER — Inpatient Hospital Stay: Payer: BLUE CROSS/BLUE SHIELD

## 2019-09-09 VITALS — BP 132/88 | HR 72 | Temp 98.1°F | Resp 18 | Ht 71.0 in | Wt 243.2 lb

## 2019-09-09 DIAGNOSIS — C187 Malignant neoplasm of sigmoid colon: Secondary | ICD-10-CM

## 2019-09-09 DIAGNOSIS — Z85038 Personal history of other malignant neoplasm of large intestine: Secondary | ICD-10-CM | POA: Insufficient documentation

## 2019-09-09 LAB — CEA (IN HOUSE-CHCC): CEA (CHCC-In House): 2.03 ng/mL (ref 0.00–5.00)

## 2019-09-09 NOTE — Telephone Encounter (Signed)
Scheduled appt per 6/11 los.  Printed calendar and avs. 

## 2019-09-09 NOTE — Progress Notes (Signed)
[place] OFFICE PROGRESS NOTE   Diagnosis: Colon cancer  INTERVAL HISTORY:   Mr. Chapel returns as scheduled.  He feels well.  No difficulty with bowel function.  Good appetite and energy level.  No complaint aside from discomfort at the left lower leg from a baseball injury.  Objective:  Vital signs in last 24 hours:  Blood pressure 132/88, pulse 72, temperature 98.1 F (36.7 C), temperature source Temporal, resp. rate 18, height '5\' 11"'$  (1.803 m), weight 243 lb 3.2 oz (110.3 kg), SpO2 99 %.    Lymphatics: No cervical, supraclavicular, axillary, or inguinal nodes Resp: Distant breath sounds, no respiratory distress, clear bilaterally Cardio: Regular rate and rhythm GI: No mass, nontender, no hepatosplenomegaly Vascular: No leg edema   Lab Results:  Lab Results  Component Value Date   WBC 4.6 05/02/2016   HGB 15.0 05/02/2016   HCT 41.8 05/02/2016   MCV 88.2 05/02/2016   PLT 209 05/02/2016   NEUTROABS 3.5 02/28/2016    CMP  Lab Results  Component Value Date   NA 137 09/06/2018   K 4.5 09/06/2018   CL 104 09/06/2018   CO2 22 09/06/2018   GLUCOSE 225 (H) 09/06/2018   BUN 20 09/06/2018   CREATININE 1.08 09/06/2018   CALCIUM 8.6 (L) 09/06/2018   PROT 7.2 09/06/2018   ALBUMIN 4.0 09/06/2018   AST 20 09/06/2018   ALT 20 09/06/2018   ALKPHOS 37 (L) 09/06/2018   BILITOT 0.6 09/06/2018   GFRNONAA >60 09/06/2018   GFRAA >60 09/06/2018    Lab Results  Component Value Date   CEA1 2.03 09/09/2019    Medications: I have reviewed the patient's current medications.   Assessment/Plan: 1. Adenocarcinoma the sigmoid colon, status post a colonoscopic biopsy 08/07/2015  CT abdomen/pelvis 07/05/2015 with masslike thickening of the sigmoid colon, adjacent lymphadenopathy, and prominent para-aortic and porta hepatis nodes  CT chest 08/28/2015 with bilateral pulmonary nodules suspicious for metastases  PET scan 09/05/2015 with sigmoid colon mass SUV  max 18.4; numerous lymph nodes in the sigmoid mesocolon most of which are too small for PET resolution; 8 mm right external iliac chain lymph node SUV max 5.2; hypermetabolic lymph nodes tracking superiorly along the retroperitoneum extending to the aortocaval station at the level of the renal arteries; index lymph node at the level of the aortic bifurcation measures 12 mm with an SUV max of 4.5; hepato duodenal lymph node measures 10 mm with SUV max 3.6; no abnormal hypermetabolism in the liver, adrenal glands, spleen or pancreas. A few scattered pulmonary nodules which do not show abnormal hypermetabolism, too small for PET resolution.  09/13/2015 status post laparoscopic sigmoidectomy and cystoscopy with bilateral stent placement  Preserved expression of the major and minor MMR proteins. MSI-stable  7 cm adenocarcinoma of the sigmoid colon, grade 2; tumor invades through the colonic wall and adherent to the pelvic sidewall and anterior peritoneum; 28 benign lymph nodes  CT biopsy of a hypermetabolic left retroperitoneal lymph node on 10/03/2015-pathology negative for malignancy  Cycle 1 FOLFOX 10/11/2015  Cycle 2 FOLFOX 10/25/2015, Neulasta added  Cycle 3 FOLFOX 11/08/2015  Cycle 4 FOLFOX 11/22/2015  Xeloda/radiation starting 12/31/2015, completed 02/06/2016  Mr. 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 History of Right lower abdominal pain secondary to #1  3. Mild neutropenia secondary to chemotherapy 10/25/2015. Neulasta  added beginning with cycle 2.  4. History of Microcytic anemia. Question iron deficiency.  5. History of Thrombocytopenia secondary to chemotherapy     Disposition: Mr. Stiefel remains in clinical remission from colon cancer.  He will return for an office visit and CEA in 6 months.  He continues colonoscopy surveillance with Dr. Fuller Plan.  Betsy Coder, MD  09/09/2019  11:23 AM

## 2020-03-09 ENCOUNTER — Other Ambulatory Visit: Payer: Self-pay

## 2020-03-09 ENCOUNTER — Inpatient Hospital Stay: Payer: BLUE CROSS/BLUE SHIELD | Attending: Nurse Practitioner | Admitting: Nurse Practitioner

## 2020-03-09 ENCOUNTER — Encounter: Payer: Self-pay | Admitting: Nurse Practitioner

## 2020-03-09 ENCOUNTER — Inpatient Hospital Stay: Payer: BLUE CROSS/BLUE SHIELD

## 2020-03-09 VITALS — BP 132/85 | HR 82 | Temp 97.4°F | Resp 18 | Ht 71.0 in | Wt 244.8 lb

## 2020-03-09 DIAGNOSIS — C187 Malignant neoplasm of sigmoid colon: Secondary | ICD-10-CM

## 2020-03-09 DIAGNOSIS — Z9221 Personal history of antineoplastic chemotherapy: Secondary | ICD-10-CM | POA: Diagnosis not present

## 2020-03-09 DIAGNOSIS — Z85038 Personal history of other malignant neoplasm of large intestine: Secondary | ICD-10-CM | POA: Insufficient documentation

## 2020-03-09 DIAGNOSIS — Z923 Personal history of irradiation: Secondary | ICD-10-CM | POA: Insufficient documentation

## 2020-03-09 LAB — CEA (IN HOUSE-CHCC): CEA (CHCC-In House): 2.25 ng/mL (ref 0.00–5.00)

## 2020-03-09 NOTE — Progress Notes (Signed)
[place] OFFICE PROGRESS NOTE   Diagnosis: Colon cancer  INTERVAL HISTORY:   Marc King returns as scheduled.  He feels well.  No change in bowel habits.  No bleeding with bowel movements.  No nausea or vomiting.  He has a good appetite.  No fever, cough, shortness of breath.    He reports having Covid about a month and a half ago.  He received the antibody infusion.  He feels he has recovered.    Objective:  Vital signs in last 24 hours:  Blood pressure 132/85, pulse 82, temperature (!) 97.4 F (36.3 C), temperature source Tympanic, resp. rate 18, height $RemoveBe'5\' 11"'qUClmvdtN$  (1.803 m), weight 244 lb 12.8 oz (111 kg), SpO2 100 %.    HEENT: Neck without mass. Lymphatics: No palpable cervical, supraclavicular, axillary or inguinal lymph nodes. Resp: Distant breath sounds, lungs clear.  No respiratory distress. Cardio: Regular rate and rhythm. GI: Abdomen soft and nontender.  No hepatomegaly.  No mass. Vascular: No leg edema.   Lab Results:  Lab Results  Component Value Date   WBC 4.6 05/02/2016   HGB 15.0 05/02/2016   HCT 41.8 05/02/2016   MCV 88.2 05/02/2016   PLT 209 05/02/2016   NEUTROABS 3.5 02/28/2016    Imaging:  No results found.  Medications: I have reviewed the patient's current medications.  Assessment/Plan: 1. Adenocarcinoma the sigmoid colon, status post a colonoscopic biopsy 08/07/2015  CT abdomen/pelvis 07/05/2015 with masslike thickening of the sigmoid colon, adjacent lymphadenopathy, and prominent para-aortic and porta hepatis nodes  CT chest 08/28/2015 with bilateral pulmonary nodules suspicious for metastases  PET scan 09/05/2015 with sigmoid colon mass SUV max 18.4; numerous lymph nodes in the sigmoid mesocolon most of which are too small for PET resolution; 8 mm right external iliac chain lymph node SUV max 5.2; hypermetabolic lymph nodes tracking superiorly along the retroperitoneum extending to the aortocaval station at the level of the  renal arteries; index lymph node at the level of the aortic bifurcation measures 12 mm with an SUV max of 4.5; hepato duodenal lymph node measures 10 mm with SUV max 3.6; no abnormal hypermetabolism in the liver, adrenal glands, spleen or pancreas. A few scattered pulmonary nodules which do not show abnormal hypermetabolism, too small for PET resolution.  09/13/2015 status post laparoscopic sigmoidectomy and cystoscopy with bilateral stent placement  Preserved expression of the major and minor MMR proteins. MSI-stable  7 cm adenocarcinoma of the sigmoid colon, grade 2; tumor invades through the colonic wall and adherent to the pelvic sidewall and anterior peritoneum; 28 benign lymph nodes  CT biopsy of a hypermetabolic left retroperitoneal lymph node on 10/03/2015-pathology negative for malignancy  Cycle 1 FOLFOX 10/11/2015  Cycle 2 FOLFOX 10/25/2015, Neulasta added  Cycle 3 FOLFOX 11/08/2015  Cycle 4 FOLFOX 11/22/2015  Xeloda/radiation starting 12/31/2015, completed 02/06/2016  Mr. 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 History of Right lower abdominal pain secondary to #1  3. Mild neutropenia secondary to chemotherapy 10/25/2015. Neulasta added beginning with cycle 2.  4. History of Microcytic anemia. Question iron deficiency.  5. History of Thrombocytopenia secondary to chemotherapy    Disposition: Marc King  remains in clinical remission from colon cancer.  We will follow-up on the CEA from today.  He  continues colonoscopy surveillance with Dr. Fuller Plan.  He will return for an office visit and CEA in 6 months.   Ned Card ANP/GNP-BC   03/09/2020  9:39 AM

## 2020-05-15 ENCOUNTER — Telehealth: Payer: Self-pay | Admitting: Oncology

## 2020-05-15 NOTE — Telephone Encounter (Signed)
Attempted to contact patient, left a detailed message about rescheduled appointment

## 2020-09-07 ENCOUNTER — Ambulatory Visit: Payer: BLUE CROSS/BLUE SHIELD | Admitting: Oncology

## 2020-09-07 ENCOUNTER — Other Ambulatory Visit: Payer: BLUE CROSS/BLUE SHIELD

## 2020-09-11 ENCOUNTER — Inpatient Hospital Stay: Payer: BLUE CROSS/BLUE SHIELD | Admitting: Oncology

## 2020-09-11 ENCOUNTER — Inpatient Hospital Stay: Payer: BLUE CROSS/BLUE SHIELD

## 2020-09-11 ENCOUNTER — Other Ambulatory Visit: Payer: BLUE CROSS/BLUE SHIELD

## 2020-09-27 ENCOUNTER — Inpatient Hospital Stay: Payer: BLUE CROSS/BLUE SHIELD | Admitting: Oncology

## 2020-09-27 ENCOUNTER — Inpatient Hospital Stay: Payer: BLUE CROSS/BLUE SHIELD | Attending: Oncology

## 2020-09-27 ENCOUNTER — Other Ambulatory Visit: Payer: Self-pay

## 2020-09-27 VITALS — BP 134/80 | HR 77 | Temp 97.8°F | Resp 18 | Ht 71.0 in | Wt 233.2 lb

## 2020-09-27 DIAGNOSIS — Z9221 Personal history of antineoplastic chemotherapy: Secondary | ICD-10-CM | POA: Diagnosis not present

## 2020-09-27 DIAGNOSIS — C187 Malignant neoplasm of sigmoid colon: Secondary | ICD-10-CM | POA: Diagnosis not present

## 2020-09-27 DIAGNOSIS — Z85038 Personal history of other malignant neoplasm of large intestine: Secondary | ICD-10-CM | POA: Diagnosis present

## 2020-09-27 DIAGNOSIS — Z923 Personal history of irradiation: Secondary | ICD-10-CM | POA: Insufficient documentation

## 2020-09-27 LAB — CEA (ACCESS): CEA (CHCC): 1.44 ng/mL (ref 0.00–5.00)

## 2020-09-27 LAB — CEA (IN HOUSE-CHCC): CEA (CHCC-In House): 1.7 ng/mL (ref 0.00–5.00)

## 2020-09-27 NOTE — Progress Notes (Signed)
  [facility] [place] OFFICE PROGRESS NOTE   Diagnosis: Colon cancer  INTERVAL HISTORY:   Mr. Fellner returns as scheduled.  He feels well.  No difficulty with bowel function.  He reports intentional weight loss.  No complaint.  Objective:  Vital signs in last 24 hours:  Blood pressure 134/80, pulse 77, temperature 97.8 F (36.6 C), temperature source Oral, resp. rate 18, height _0  (1.803 m), weight 233 lb 3.2 oz (105.8 kg), SpO2 100 %.  Lymphatics: No cervical, supraclavicular, axillary, or inguinal nodes Resp: Lungs clear bilaterally Cardio: Regular rate and rhythm GI: No hepatosplenomegaly, nontender, no mass Vascular: No leg edema   Lab: CEA-1.7    Medications: I have reviewed the patient's current medications.   Assessment/Plan:  1. Adenocarcinoma the sigmoid colon, status post a colonoscopic biopsy 08/07/2015 CT abdomen/pelvis 07/05/2015 with masslike thickening of the sigmoid colon, adjacent lymphadenopathy, and prominent para-aortic and porta hepatis nodes CT chest 08/28/2015 with bilateral pulmonary nodules suspicious for metastases PET scan 09/05/2015 with sigmoid colon mass SUV max 18.4; numerous lymph nodes in the sigmoid mesocolon most of which are too small for PET resolution; 8 mm right external iliac chain lymph node SUV max 5.2; hypermetabolic lymph nodes tracking superiorly along the retroperitoneum extending to the aortocaval station at the level of the renal arteries; index lymph node at the level of the aortic bifurcation measures 12 mm with an SUV max of 4.5; hepato duodenal lymph node measures 10 mm with SUV max 3.6; no abnormal hypermetabolism in the liver, adrenal glands, spleen or pancreas. A few scattered pulmonary nodules which do not show abnormal hypermetabolism, too small for PET resolution. 09/13/2015 status post laparoscopic sigmoidectomy and cystoscopy with bilateral stent placement Preserved expression of the major and minor MMR  proteins. MSI-stable 7 cm adenocarcinoma of the sigmoid colon, grade 2; tumor invades through the colonic wall and adherent to the pelvic sidewall and anterior peritoneum; 28 benign lymph nodes CT biopsy of a hypermetabolic left retroperitoneal lymph node on 10/03/2015-pathology negative for malignancy Cycle 1 FOLFOX 10/11/2015 Cycle 2 FOLFOX 10/25/2015, Neulasta added Cycle 3 FOLFOX 11/08/2015 Cycle 4 FOLFOX 11/22/2015 Xeloda/radiation starting 12/31/2015, completed 02/06/2016 Mr. 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    History of Right lower abdominal pain secondary to #1   3.    Mild neutropenia secondary to chemotherapy 10/25/2015. Neulasta added beginning with cycle 2.   4.    History of Microcytic anemia. Question iron deficiency.   5.    History of Thrombocytopenia secondary to chemotherapy       Disposition: Mr. Marchetta is in clinical remission from colon cancer.  He would like to continue follow-up at the Cancer center.  He will return for an office visit and CEA in 1 year.  He continues colonoscopy follow-up with Dr. Fuller Plan.  Betsy Coder, MD  09/27/2020  8:37 AM

## 2020-10-02 ENCOUNTER — Telehealth: Payer: Self-pay

## 2020-10-02 NOTE — Telephone Encounter (Signed)
-----   Message from [name], MD sent at 09/27/2020  3:18 PM EDT ----- Please call patient, CEA is normal, follow-up as scheduled

## 2020-10-02 NOTE — Telephone Encounter (Signed)
V/M message left for Pt about normal lab results informed Pt if he had any other problems or concerns to return call to the clinic.

## 2021-09-27 ENCOUNTER — Inpatient Hospital Stay: Payer: BC Managed Care – PPO | Attending: Oncology

## 2021-09-27 ENCOUNTER — Inpatient Hospital Stay: Payer: BC Managed Care – PPO | Admitting: Oncology

## 2021-09-27 VITALS — BP 139/93 | HR 74 | Temp 98.1°F | Resp 20 | Ht 71.0 in | Wt 234.4 lb

## 2021-09-27 DIAGNOSIS — Z85038 Personal history of other malignant neoplasm of large intestine: Secondary | ICD-10-CM | POA: Insufficient documentation

## 2021-09-27 DIAGNOSIS — Z08 Encounter for follow-up examination after completed treatment for malignant neoplasm: Secondary | ICD-10-CM | POA: Insufficient documentation

## 2021-09-27 DIAGNOSIS — C187 Malignant neoplasm of sigmoid colon: Secondary | ICD-10-CM | POA: Diagnosis not present

## 2021-09-27 LAB — CEA (ACCESS): CEA (CHCC): 1.48 ng/mL (ref 0.00–5.00)

## 2021-09-27 NOTE — Progress Notes (Signed)
[facility] OFFICE PROGRESS NOTE   Diagnosis: Colon cancer  INTERVAL HISTORY:   Marc King returns as scheduled.  He feels well.  Good appetite.  He is working.  No difficulty with bowel function.  No bleeding.  No new complaint.  Objective:  Vital signs in last 24 hours:  Blood pressure (!) 139/93, pulse 74, temperature 98.1 F (36.7 C), temperature source Oral, resp. rate 20, height 5\' 11"  (1.803 m), weight 234 lb 6.4 oz (106.3 kg), SpO2 100 %.    Lymphatics: No cervical, supraclavicular, axillary, or inguinal nodes, prominent right axillary fat pad Resp: Lungs clear bilaterally, distant breath sounds Cardio: Regular rate and rhythm GI: No hepatosplenomegaly, nontender, no mass Vascular: No leg edema   Lab Results:  Lab Results  Component Value Date   WBC 4.6 05/02/2016   HGB 15.0 05/02/2016   HCT 41.8 05/02/2016   MCV 88.2 05/02/2016   PLT 209 05/02/2016   NEUTROABS 3.5 02/28/2016    CMP  Lab Results  Component Value Date   NA 137 09/06/2018   K 4.5 09/06/2018   CL 104 09/06/2018   CO2 22 09/06/2018   GLUCOSE 225 (H) 09/06/2018   BUN 20 09/06/2018   CREATININE 1.08 09/06/2018   CALCIUM 8.6 (L) 09/06/2018   PROT 7.2 09/06/2018   ALBUMIN 4.0 09/06/2018   AST 20 09/06/2018   ALT 20 09/06/2018   ALKPHOS 37 (L) 09/06/2018   BILITOT 0.6 09/06/2018   GFRNONAA >60 09/06/2018   GFRAA >60 09/06/2018    Lab Results  Component Value Date   CEA1 1.70 09/27/2020   CEA 1.44 09/27/2020     Medications: I have reviewed the patient's current medications.   Assessment/Plan: Adenocarcinoma the sigmoid colon, status post a colonoscopic biopsy 08/07/2015 CT abdomen/pelvis 07/05/2015 with masslike thickening of the sigmoid colon, adjacent lymphadenopathy, and prominent para-aortic and porta hepatis nodes CT chest 08/28/2015 with bilateral pulmonary nodules suspicious for metastases PET scan 09/05/2015 with sigmoid colon mass SUV max 18.4; numerous  lymph nodes in the sigmoid mesocolon most of which are too small for PET resolution; 8 mm right external iliac chain lymph node SUV max 5.2; hypermetabolic lymph nodes tracking superiorly along the retroperitoneum extending to the aortocaval station at the level of the renal arteries; index lymph node at the level of the aortic bifurcation measures 12 mm with an SUV max of 4.5; hepato duodenal lymph node measures 10 mm with SUV max 3.6; no abnormal hypermetabolism in the liver, adrenal glands, spleen or pancreas. A few scattered pulmonary nodules which do not show abnormal hypermetabolism, too small for PET resolution. 09/13/2015 status post laparoscopic sigmoidectomy and cystoscopy with bilateral stent placement Preserved expression of the major and minor MMR proteins. MSI-stable 7 cm adenocarcinoma of the sigmoid colon, grade 2; tumor invades through the colonic wall and adherent to the pelvic sidewall and anterior peritoneum; 28 benign lymph nodes CT biopsy of a hypermetabolic left retroperitoneal lymph node on 10/03/2015-pathology negative for malignancy Cycle 1 FOLFOX 10/11/2015 Cycle 2 FOLFOX 10/25/2015, Neulasta added Cycle 3 FOLFOX 11/08/2015 Cycle 4 FOLFOX 11/22/2015 Xeloda/radiation starting 12/31/2015, completed 02/06/2016 Marc King declined further chemotherapy Staging chest CT 03/26/2016-no change in bilateral lung nodules or a borderline right hilar node Surveillance colonoscopy 08/18/2016-hyperplastic polyp removed from the rectum CTs 08/28/2016-stable lung nodules, no evidence of metastatic disease CTs 08/31/2017-stable lung nodules, no evidence of metastatic disease CTs 09/06/2018- unchanged lung nodules, no evidence of metastatic disease 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  History of Right lower abdominal pain secondary to #1   3.    Mild neutropenia secondary to chemotherapy 10/25/2015. Neulasta added beginning with cycle 2.   4.    History of  Microcytic anemia. Question iron deficiency.   5.    History of Thrombocytopenia secondary to chemotherapy     Disposition: Marc King remains in clinical remission from colon cancer.  He would like to continue follow-up at the Cancer center.  He will return for an office visit in 1 year.  He will continue colonoscopy surveillance with Dr. Fuller Plan.  Betsy Coder, MD  09/27/2021  8:43 AM

## 2021-10-09 ENCOUNTER — Encounter: Payer: Self-pay | Admitting: Gastroenterology

## 2022-09-26 ENCOUNTER — Inpatient Hospital Stay: Payer: BC Managed Care – PPO | Attending: Oncology

## 2022-09-26 ENCOUNTER — Inpatient Hospital Stay: Payer: BC Managed Care – PPO | Admitting: Oncology

## 2022-09-26 ENCOUNTER — Encounter: Payer: Self-pay | Admitting: Oncology

## 2022-09-26 ENCOUNTER — Telehealth: Payer: Self-pay

## 2022-09-26 VITALS — BP 136/92 | HR 74 | Temp 98.1°F | Resp 18 | Ht 71.0 in | Wt 237.6 lb

## 2022-09-26 DIAGNOSIS — Z923 Personal history of irradiation: Secondary | ICD-10-CM | POA: Diagnosis not present

## 2022-09-26 DIAGNOSIS — C187 Malignant neoplasm of sigmoid colon: Secondary | ICD-10-CM

## 2022-09-26 DIAGNOSIS — Z9221 Personal history of antineoplastic chemotherapy: Secondary | ICD-10-CM | POA: Insufficient documentation

## 2022-09-26 DIAGNOSIS — Z85038 Personal history of other malignant neoplasm of large intestine: Secondary | ICD-10-CM | POA: Diagnosis present

## 2022-09-26 LAB — CEA (ACCESS): CEA (CHCC): 1.76 ng/mL (ref 0.00–5.00)

## 2022-09-26 NOTE — Progress Notes (Signed)
[facility] OFFICE PROGRESS NOTE   Diagnosis: Colon cancer  INTERVAL HISTORY:   Marc King returns as scheduled.  He feels well.  No difficulty with bowel or bladder function.  No bleeding.  Good appetite.  Objective:  Vital signs in last 24 hours:  Blood pressure (!) 136/92, pulse 74, temperature 98.1 F (36.7 C), temperature source Oral, resp. rate 18, height 5\' 11"  (1.803 m), weight 237 lb 9.6 oz (107.8 kg), SpO2 100 %.    Lymphatics: No cervical, supraclavicular, axillary, or inguinal nodes Resp: Distant breath sounds, lungs clear bilaterally, no respiratory distress Cardio: Regular rate and rhythm GI: No mass, nontender, no hepatosplenomegaly Vascular: No leg edema   Lab Results:  Lab Results  Component Value Date   WBC 4.6 05/02/2016   HGB 15.0 05/02/2016   HCT 41.8 05/02/2016   MCV 88.2 05/02/2016   PLT 209 05/02/2016   NEUTROABS 3.5 02/28/2016    CMP  Lab Results  Component Value Date   NA 137 09/06/2018   K 4.5 09/06/2018   CL 104 09/06/2018   CO2 22 09/06/2018   GLUCOSE 225 (H) 09/06/2018   BUN 20 09/06/2018   CREATININE 1.08 09/06/2018   CALCIUM 8.6 (L) 09/06/2018   PROT 7.2 09/06/2018   ALBUMIN 4.0 09/06/2018   AST 20 09/06/2018   ALT 20 09/06/2018   ALKPHOS 37 (L) 09/06/2018   BILITOT 0.6 09/06/2018   GFRNONAA >60 09/06/2018   GFRAA >60 09/06/2018    Lab Results  Component Value Date   CEA1 1.70 09/27/2020   CEA 1.48 09/27/2021     Medications: I have reviewed the patient's current medications.   Assessment/Plan: Adenocarcinoma the sigmoid colon, status post a colonoscopic biopsy 08/07/2015 CT abdomen/pelvis 07/05/2015 with masslike thickening of the sigmoid colon, adjacent lymphadenopathy, and prominent para-aortic and porta hepatis nodes CT chest 08/28/2015 with bilateral pulmonary nodules suspicious for metastases PET scan 09/05/2015 with sigmoid colon mass SUV max 18.4; numerous lymph nodes in the sigmoid  mesocolon most of which are too small for PET resolution; 8 mm right external iliac chain lymph node SUV max 5.2; hypermetabolic lymph nodes tracking superiorly along the retroperitoneum extending to the aortocaval station at the level of the renal arteries; index lymph node at the level of the aortic bifurcation measures 12 mm with an SUV max of 4.5; hepato duodenal lymph node measures 10 mm with SUV max 3.6; no abnormal hypermetabolism in the liver, adrenal glands, spleen or pancreas. A few scattered pulmonary nodules which do not show abnormal hypermetabolism, too small for PET resolution. 09/13/2015 status post laparoscopic sigmoidectomy and cystoscopy with bilateral stent placement Preserved expression of the major and minor MMR proteins. MSI-stable 7 cm adenocarcinoma of the sigmoid colon, grade 2; tumor invades through the colonic wall and adherent to the pelvic sidewall and anterior peritoneum; 28 benign lymph nodes CT biopsy of a hypermetabolic left retroperitoneal lymph node on 10/03/2015-pathology negative for malignancy Cycle 1 FOLFOX 10/11/2015 Cycle 2 FOLFOX 10/25/2015, Neulasta added Cycle 3 FOLFOX 11/08/2015 Cycle 4 FOLFOX 11/22/2015 Xeloda/radiation starting 12/31/2015, completed 02/06/2016 Marc King declined further chemotherapy Staging chest CT 03/26/2016-no change in bilateral lung nodules or a borderline right hilar node Surveillance colonoscopy 08/18/2016-hyperplastic polyp removed from the rectum CTs 08/28/2016-stable lung nodules, no evidence of metastatic disease CTs 08/31/2017-stable lung nodules, no evidence of metastatic disease CTs 09/06/2018- unchanged lung nodules, no evidence of metastatic disease 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    History of Right lower abdominal pain secondary  to #1   3.    Mild neutropenia secondary to chemotherapy 10/25/2015. Neulasta added beginning with cycle 2.   4.    History of Microcytic anemia. Question  iron deficiency.   5.    History of Thrombocytopenia secondary to chemotherapy       Disposition: Marc King is in clinical remission from colon cancer.  We will follow-up on the CEA from today.  He would like to continue follow-up at the cancer center.  He will return for an office visit in 1 year.  Marc King will schedule a surveillance colonoscopy with Dr. Russella Dar.  Thornton Papas, MD  09/26/2022  8:15 AM

## 2022-09-26 NOTE — Telephone Encounter (Signed)
-----   Message from Ladene Artist, MD sent at 09/26/2022  3:35 PM EDT ----- Please call patient the CEA is normal, follow-up as scheduled

## 2022-09-26 NOTE — Telephone Encounter (Signed)
I attempted to contact the patient to advise him that his CEA level is within normal range. I did not receive a response, so I left a message requesting that he contact the office if he has any further questions.

## 2023-09-25 ENCOUNTER — Inpatient Hospital Stay: Payer: Self-pay

## 2023-09-25 ENCOUNTER — Other Ambulatory Visit: Payer: Self-pay | Admitting: *Deleted

## 2023-09-25 ENCOUNTER — Inpatient Hospital Stay: Payer: BC Managed Care – PPO | Attending: Oncology | Admitting: Oncology

## 2023-09-25 ENCOUNTER — Other Ambulatory Visit: Payer: BC Managed Care – PPO

## 2023-09-25 VITALS — BP 128/85 | HR 69 | Temp 98.2°F | Resp 18 | Ht 71.0 in | Wt 219.2 lb

## 2023-09-25 DIAGNOSIS — Z85038 Personal history of other malignant neoplasm of large intestine: Secondary | ICD-10-CM | POA: Insufficient documentation

## 2023-09-25 DIAGNOSIS — C187 Malignant neoplasm of sigmoid colon: Secondary | ICD-10-CM

## 2023-09-25 DIAGNOSIS — D509 Iron deficiency anemia, unspecified: Secondary | ICD-10-CM | POA: Insufficient documentation

## 2023-09-25 DIAGNOSIS — Z08 Encounter for follow-up examination after completed treatment for malignant neoplasm: Secondary | ICD-10-CM | POA: Diagnosis present

## 2023-09-25 LAB — CEA (ACCESS): CEA (CHCC): 1.38 ng/mL (ref 0.00–5.00)

## 2023-09-25 NOTE — Progress Notes (Signed)
 [facility] OFFICE PROGRESS NOTE   Diagnosis: Colon cancer  INTERVAL HISTORY:   Mr. Hoey returns as scheduled.  He feels well.  Good appetite.  No difficulty with bowel function.  No bleeding.  He reports intentional weight loss with treatment diabetes.  Objective:  Vital signs in last 24 hours:  Blood pressure 128/85, pulse 69, temperature 98.2 F (36.8 C), temperature source Temporal, resp. rate 18, height 5' 11 (1.803 m), weight 219 lb 3.2 oz (99.4 kg), SpO2 100%.    Lymphatics: No cervical, supraclavicular, axillary, or inguinal nodes Resp: Lungs clear bilaterally Cardio: Regular rate and rhythm GI: No mass, nontender, no hepatosplenomegaly Vascular: No leg edema  Skin: Mildly inflamed cyst at the mid upper back   Lab Results:  Lab Results  Component Value Date   WBC 4.6 05/02/2016   HGB 15.0 05/02/2016   HCT 41.8 05/02/2016   MCV 88.2 05/02/2016   PLT 209 05/02/2016   NEUTROABS 3.5 02/28/2016    CMP  Lab Results  Component Value Date   NA 137 09/06/2018   K 4.5 09/06/2018   CL 104 09/06/2018   CO2 22 09/06/2018   GLUCOSE 225 (H) 09/06/2018   BUN 20 09/06/2018   CREATININE 1.08 09/06/2018   CALCIUM  8.6 (L) 09/06/2018   PROT 7.2 09/06/2018   ALBUMIN 4.0 09/06/2018   AST 20 09/06/2018   ALT 20 09/06/2018   ALKPHOS 37 (L) 09/06/2018   BILITOT 0.6 09/06/2018   GFRNONAA >60 09/06/2018   GFRAA >60 09/06/2018    Lab Results  Component Value Date   CEA1 1.70 09/27/2020   CEA 1.38 09/25/2023    Medications: I have reviewed the patient's current medications.   Assessment/Plan: Adenocarcinoma the sigmoid colon, status post a colonoscopic biopsy 08/07/2015 CT abdomen/pelvis 07/05/2015 with masslike thickening of the sigmoid colon, adjacent lymphadenopathy, and prominent para-aortic and porta hepatis nodes CT chest 08/28/2015 with bilateral pulmonary nodules suspicious for metastases PET scan 09/05/2015 with sigmoid colon mass SUV max  18.4; numerous lymph nodes in the sigmoid mesocolon most of which are too small for PET resolution; 8 mm right external iliac chain lymph node SUV max 5.2; hypermetabolic lymph nodes tracking superiorly along the retroperitoneum extending to the aortocaval station at the level of the renal arteries; index lymph node at the level of the aortic bifurcation measures 12 mm with an SUV max of 4.5; hepato duodenal lymph node measures 10 mm with SUV max 3.6; no abnormal hypermetabolism in the liver, adrenal glands, spleen or pancreas. A few scattered pulmonary nodules which do not show abnormal hypermetabolism, too small for PET resolution. 09/13/2015 status post laparoscopic sigmoidectomy and cystoscopy with bilateral stent placement Preserved expression of the major and minor MMR proteins. MSI-stable 7 cm adenocarcinoma of the sigmoid colon, grade 2; tumor invades through the colonic wall and adherent to the pelvic sidewall and anterior peritoneum; 28 benign lymph nodes CT biopsy of a hypermetabolic left retroperitoneal lymph node on 10/03/2015-pathology negative for malignancy Cycle 1 FOLFOX 10/11/2015 Cycle 2 FOLFOX 10/25/2015, Neulasta  added Cycle 3 FOLFOX 11/08/2015 Cycle 4 FOLFOX 11/22/2015 Xeloda /radiation starting 12/31/2015, completed 02/06/2016 Mr. Malizia declined further chemotherapy Staging chest CT 03/26/2016-no change in bilateral lung nodules or a borderline right hilar node Surveillance colonoscopy 08/18/2016-hyperplastic polyp removed from the rectum CTs 08/28/2016-stable lung nodules, no evidence of metastatic disease CTs 08/31/2017-stable lung nodules, no evidence of metastatic disease CTs 09/06/2018- unchanged lung nodules, no evidence of metastatic disease 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  History of Right lower abdominal pain secondary to #1   3.    Mild neutropenia secondary to chemotherapy 10/25/2015. Neulasta  added beginning with cycle 2.   4.     History of Microcytic anemia. Question iron deficiency.   5.    History of Thrombocytopenia secondary to chemotherapy        Disposition: Mr. Gunby is in clinical remission from colon cancer.  He would like to continue follow-up at the Cancer center.  He will return for an office visit in 1 year.  He is due for a surveillance colonoscopy.  We will make a referral to [facility] gastroenterology.  Arley Hof, MD  09/25/2023  8:27 AM

## 2023-11-26 ENCOUNTER — Encounter: Payer: Self-pay | Admitting: Oncology

## 2023-12-02 ENCOUNTER — Encounter: Payer: Self-pay | Admitting: Gastroenterology

## 2024-01-01 ENCOUNTER — Ambulatory Visit (AMBULATORY_SURGERY_CENTER)

## 2024-01-01 ENCOUNTER — Encounter: Payer: Self-pay | Admitting: Gastroenterology

## 2024-01-01 VITALS — Ht 71.0 in | Wt 215.8 lb

## 2024-01-01 DIAGNOSIS — Z85038 Personal history of other malignant neoplasm of large intestine: Secondary | ICD-10-CM

## 2024-01-01 MED ORDER — NA SULFATE-K SULFATE-MG SULF 17.5-3.13-1.6 GM/177ML PO SOLN: 1.0000 | Freq: Once | ORAL | 0 refills | Status: AC

## 2024-01-01 NOTE — Progress Notes (Signed)
 No egg or soy allergy known to patient  No issues known to pt with past sedation with any surgeries or procedures Patient denies ever being told they had issues or difficulty with intubation  No FH of Malignant Hyperthermia Pt is not on diet pills Takes Mounjaro for DM, hold instructions provided Pt is not on  home 02  Pt is not on blood thinners  Pt denies issues with constipation  No A fib or A flutter Have any cardiac testing pending--NO Pt can ambulate  Pt denies use of chewing tobacco Discussed diabetic I weight loss medication holds Discussed NSAID holds Checked BMI Pt instructed to use Singlecare.com or GoodRx for a price reduction on prep  Patient's chart reviewed by Norleen Schillings CNRA prior to previsit and patient appropriate for the [facility].  Pre visit completed and red dot placed by patient's name on their procedure day (on provider's schedule).

## 2024-01-15 ENCOUNTER — Encounter: Payer: Self-pay | Admitting: Gastroenterology

## 2024-01-15 ENCOUNTER — Ambulatory Visit (AMBULATORY_SURGERY_CENTER): Admitting: Gastroenterology

## 2024-01-15 VITALS — BP 111/75 | HR 61 | Temp 97.5°F | Resp 14 | Ht 71.0 in | Wt 215.8 lb

## 2024-01-15 DIAGNOSIS — Z980 Intestinal bypass and anastomosis status: Secondary | ICD-10-CM | POA: Diagnosis not present

## 2024-01-15 DIAGNOSIS — Z1211 Encounter for screening for malignant neoplasm of colon: Secondary | ICD-10-CM

## 2024-01-15 DIAGNOSIS — Z87891 Personal history of nicotine dependence: Secondary | ICD-10-CM | POA: Diagnosis not present

## 2024-01-15 DIAGNOSIS — D123 Benign neoplasm of transverse colon: Secondary | ICD-10-CM | POA: Diagnosis not present

## 2024-01-15 DIAGNOSIS — Z85038 Personal history of other malignant neoplasm of large intestine: Secondary | ICD-10-CM

## 2024-01-15 DIAGNOSIS — K529 Noninfective gastroenteritis and colitis, unspecified: Secondary | ICD-10-CM | POA: Diagnosis not present

## 2024-01-15 DIAGNOSIS — D122 Benign neoplasm of ascending colon: Secondary | ICD-10-CM | POA: Diagnosis not present

## 2024-01-15 DIAGNOSIS — K635 Polyp of colon: Secondary | ICD-10-CM | POA: Diagnosis not present

## 2024-01-15 MED ORDER — SODIUM CHLORIDE 0.9 % IV SOLN: 500.0000 mL | INTRAVENOUS | Status: DC

## 2024-01-15 NOTE — Progress Notes (Signed)
 Pt's states no medical or surgical changes since previsit or office visit.

## 2024-01-15 NOTE — Progress Notes (Signed)
 [facility] History and Physical   Primary Care Physician:  Burney Darice CROME, MD   Reason for Procedure:   Colon cancer surveillance  Plan:    Surveillance colonoscopy     HPI: Marc King is a 48 y.o. male undergoing surveillance colonoscopy.  He was diagnosed with colon cancer (sigmoid) in 2017 and underwent resection/chemotherapy.  His last colonoscopy was in 2020 and was normal except for mild anastomotic stricture. He has no chronic GI symptoms.    Past Medical History:  Diagnosis Date   Cancer of sigmoid colon (HCC) 08/07/2015   Diabetes mellitus without complication (HCC)    Family history of colon cancer    Scarlet fever     Past Surgical History:  Procedure Laterality Date   CYSTOSCOPY WITH STENT PLACEMENT Bilateral 09/13/2015   Procedure: CYSTOSCOPY WITH BILATERAL STENT PLACEMENT;  Surgeon: Donnice Brooks, MD;  Location: [facility];  Service: Urology;  Laterality: Bilateral;   IR GENERIC HISTORICAL  05/02/2016   IR REMOVAL TUN ACCESS W/ PORT W/O FL MOD SED 05/02/2016 Ozell Specking, MD [facility]-INTERV RAD   LAPAROSCOPIC PARTIAL COLECTOMY N/A 09/13/2015   Procedure: LAPAROSCOPIC SIGMOIDECTOMY;  Surgeon: Bernarda Ned, MD;  Location: [facility];  Service: General;  Laterality: N/A;   PROCTOSCOPY  09/13/2015   Procedure: PROCTOSCOPY;  Surgeon: Bernarda Ned, MD;  Location: [facility];  Service: General;;   VASECTOMY      Prior to Admission medications   Medication Sig Start Date End Date Taking? Authorizing Provider  JARDIANCE 25 MG TABS tablet Take 12.5 mg by mouth daily. 09/19/20  Yes [provider]  ibuprofen (ADVIL,MOTRIN) 200 MG tablet Take 200-400 mg by mouth every 6 (six) hours as needed (For pain.).    [provider]  MOUNJARO 5 MG/0.5ML Pen Inject 5 mg into the skin once a week. 12/18/23   [provider]    Current Outpatient Medications  Medication Sig Dispense Refill   JARDIANCE 25 MG TABS tablet Take 12.5 mg by mouth daily.      ibuprofen (ADVIL,MOTRIN) 200 MG tablet Take 200-400 mg by mouth every 6 (six) hours as needed (For pain.).     MOUNJARO 5 MG/0.5ML Pen Inject 5 mg into the skin once a week.     Current Facility-Administered Medications  Medication Dose Route Frequency Provider Last Rate Last Admin   0.9 %  sodium chloride  infusion  500 mL Intravenous Continuous Stacia Glendia FORBES, MD        Allergies as of 01/15/2024   (No Known Allergies)    Family History  Problem Relation Age of Onset   Melanoma Father 54       on foot   Clotting disorder Father    Leukemia Mother 58       CLL   Diabetes Mother    Colitis Sister    Irritable bowel syndrome Sister    Celiac disease Sister    Heart failure Maternal Grandmother        during surgery   Anuerysm Paternal Grandmother        brain   Anuerysm Maternal Grandfather        stomach   Colon cancer Cousin 19       paternal first cousin's child   Colon cancer Other        Maternal great grandmother dx in her 71s   Esophageal cancer Neg Hx    Stomach cancer Neg Hx    Rectal cancer Neg Hx     Social  History   Socioeconomic History   Marital status: Married    Spouse name: April   Number of children: 2   Years of education: Not on file   Highest education level: Not on file  Occupational History   Occupation: sales  Tobacco Use   Smoking status: Former    Current packs/day: 0.00    Types: Cigarettes    Quit date: 03/31/2004    Years since quitting: 19.8   Smokeless tobacco: Never  Vaping Use   Vaping status: Never Used  Substance and Sexual Activity   Alcohol use: No    Alcohol/week: 0.0 standard drinks of alcohol    Comment: Quit in 2006   Drug use: No    Comment: hx of 2005 marijuana use none in 18 years 2025   Sexual activity: Yes  Other Topics Concern   Not on file  Social History Narrative   Married, wife April   #2 children-ages 8/10 (homeschool)   Works in Airline pilot for [employer]   Social Drivers of Research scientist (physical sciences) Strain: Not on file  Food Insecurity: Not on file  Transportation Needs: Not on file  Physical Activity: Not on file  Stress: Not on file  Social Connections: Not on file  Intimate Partner Violence: Not on file    Review of Systems:  All other review of systems negative except as mentioned in the HPI.  Physical Exam: Vital signs BP 138/84   Pulse 68   Temp (!) 97.5 F (36.4 C) (Temporal)   Ht 5' 11 (1.803 m)   Wt 215 lb 12.8 oz (97.9 kg)   SpO2 100%   BMI 30.10 kg/m   General:   Alert,  Well-developed, well-nourished, pleasant and cooperative in NAD Airway:  Mallampati 2 Lungs:  Clear throughout to auscultation.   Heart:  Regular rate and rhythm; no murmurs, clicks, rubs,  or gallops. Abdomen:  Soft, nontender and nondistended. Normal bowel sounds.   Neuro/Psych:  Normal mood and affect. A and O x 3   Germani Gavilanes E. Stacia, MD [facility]

## 2024-01-15 NOTE — Patient Instructions (Signed)
Resume previous diet. Continue present medications. Await pathology results. Repeat colonoscopy in 5 years for surveillance.  YOU HAD AN ENDOSCOPIC PROCEDURE TODAY AT THE [facility]:   Refer to the procedure report that was given to you for any specific questions about what was found during the examination.  If the procedure report does not answer your questions, please call your gastroenterologist to clarify.  If you requested that your care partner not be given the details of your procedure findings, then the procedure report has been included in a sealed envelope for you to review at your convenience later.  YOU SHOULD EXPECT: Some feelings of bloating in the abdomen. Passage of more gas than usual.  Walking can help get rid of the air that was put into your GI tract during the procedure and reduce the bloating. If you had a lower endoscopy (such as a colonoscopy or flexible sigmoidoscopy) you may notice spotting of blood in your stool or on the toilet paper. If you underwent a bowel prep for your procedure, you may not have a normal bowel movement for a few days.  Please Note:  You might notice some irritation and congestion in your nose or some drainage.  This is from the oxygen used during your procedure.  There is no need for concern and it should clear up in a day or so.  SYMPTOMS TO REPORT IMMEDIATELY:  Following lower endoscopy (colonoscopy or flexible sigmoidoscopy):  Excessive amounts of blood in the stool  Significant tenderness or worsening of abdominal pains  Swelling of the abdomen that is new, acute  Fever of 100F or higher   For urgent or emergent issues, a gastroenterologist can be reached at any hour by calling [phone]. Do not use MyChart messaging for urgent concerns.    DIET:  We do recommend a small meal at first, but then you may proceed to your regular diet.  Drink plenty of fluids but you should avoid alcoholic beverages for 24  hours.  ACTIVITY:  You should plan to take it easy for the rest of today and you should NOT DRIVE or use heavy machinery until tomorrow (because of the sedation medicines used during the test).    FOLLOW UP: Our staff will call the number listed on your records the next business day following your procedure.  We will call around 7:15- 8:00 am to check on you and address any questions or concerns that you may have regarding the information given to you following your procedure. If we do not reach you, we will leave a message.     If any biopsies were taken you will be contacted by phone or by letter within the next 1-3 weeks.  Please call us at [phone] if you have not heard about the biopsies in 3 weeks.    SIGNATURES/CONFIDENTIALITY: You and/or your care partner have signed paperwork which will be entered into your electronic medical record.  These signatures attest to the fact that that the information above on your After Visit Summary has been reviewed and is understood.  Full responsibility of the confidentiality of this discharge information lies with you and/or your care-partner.

## 2024-01-15 NOTE — Op Note (Signed)
 [facility] Patient Name: Marc King Procedure Date: 01/15/2024 8:35 AM MRN: [id number] Endoscopist: Glendia E. Stacia , MD, [id number] Age: 48 Referring MD:  Date of Birth: 1976-01-12 Gender: Male Account #: [id number] Procedure:                Colonoscopy Indications:              High risk colon cancer surveillance: Personal                            history of colon cancer, Last colonoscopy: June 2020 Medicines:                Monitored Anesthesia Care Procedure:                Pre-Anesthesia Assessment:                           - Prior to the procedure, a History and Physical                            was performed, and patient medications and                            allergies were reviewed. The patient's tolerance of                            previous anesthesia was also reviewed. The risks                            and benefits of the procedure and the sedation                            options and risks were discussed with the patient.                            All questions were answered, and informed consent                            was obtained. Prior Anticoagulants: The patient has                            taken no anticoagulant or antiplatelet agents. ASA                            Grade Assessment: III - A patient with severe                            systemic disease. After reviewing the risks and                            benefits, the patient was deemed in satisfactory                            condition to undergo the procedure.  After obtaining informed consent, the colonoscope                            was passed under direct vision. Throughout the                            procedure, the patient's blood pressure, pulse, and                            oxygen saturations were monitored continuously. The                            Olympus CF-HQ190L ([id number]) Colonoscope was                             introduced through the anus and advanced to the the                            cecum, identified by appendiceal orifice and                            ileocecal valve. The colonoscopy was performed                            without difficulty. The patient tolerated the                            procedure well. The quality of the bowel                            preparation was adequate. The ileocecal valve,                            appendiceal orifice, and rectum were photographed.                            The bowel preparation used was SUPREP via split                            dose instruction. Scope In: 8:44:13 AM Scope Out: 9:04:01 AM Scope Withdrawal Time: 0 hours 16 minutes 53 seconds  Total Procedure Duration: 0 hours 19 minutes 48 seconds  Findings:                 The perianal and digital rectal examinations were                            normal. Pertinent negatives include normal                            sphincter tone and no palpable rectal lesions.                           A 4 mm polyp was found in the ascending colon. The  polyp was sessile. The polyp was removed with a                            cold snare. Resection and retrieval were complete.                            Estimated blood loss was minimal.                           A 3 mm polyp was found in the transverse colon. The                            polyp was sessile. The polyp was removed with a                            cold snare. Resection and retrieval were complete.                            Estimated blood loss was minimal.                           There was evidence of a prior end-to-end                            colo-colonic anastomosis in the proximal rectum.                            This was patent and was characterized by erythema,                            mild stenosis and ulceration. The anastomosis was                            traversed. Biopsies were  taken with a cold forceps                            for histology. Estimated blood loss was minimal.                           The exam was otherwise normal throughout the                            examined colon.                           The retroflexed view of the distal rectum and anal                            verge was normal and showed no anal or rectal                            abnormalities. Complications:            No immediate complications. Estimated Blood Loss:  Estimated blood loss was minimal. Impression:               - One 4 mm polyp in the ascending colon, removed                            with a cold snare. Resected and retrieved.                           - One 3 mm polyp in the transverse colon, removed                            with a cold snare. Resected and retrieved.                           - Patent end-to-end colo-colonic anastomosis,                            characterized by erythema, ulceration and mild                            stenosis. Biopsied.                           - The distal rectum and anal verge are normal on                            retroflexion view. Recommendation:           - Patient has a contact number available for                            emergencies. The signs and symptoms of potential                            delayed complications were discussed with the                            patient. Return to normal activities tomorrow.                            Written discharge instructions were provided to the                            patient.                           - Resume previous diet.                           - Continue present medications.                           - Await pathology results.                           - Repeat colonoscopy in 5 years for surveillance. Marjoria Mancillas E. Stacia,  MD 01/15/2024 9:09:41 AM This report has been signed electronically.

## 2024-01-15 NOTE — Progress Notes (Signed)
 Called to room to assist during endoscopic procedure.  Patient ID and intended procedure confirmed with present staff. Received instructions for my participation in the procedure from the performing physician.

## 2024-01-15 NOTE — Progress Notes (Signed)
 Sedate, gd SR, tolerated procedure well, VSS, report to RN

## 2024-01-18 ENCOUNTER — Telehealth: Payer: Self-pay | Admitting: Lactation Services

## 2024-01-18 NOTE — Telephone Encounter (Signed)
 No answer left voice mail

## 2024-01-19 LAB — SURGICAL PATHOLOGY

## 2024-01-21 ENCOUNTER — Ambulatory Visit: Payer: Self-pay | Admitting: Gastroenterology

## 2024-01-21 NOTE — Progress Notes (Signed)
 Marc King,  The 2 small polyps removed from your colon were common benign, but precancerous polyps.  These polyps have been completely removed.  Guidelines suggest repeating colonoscopy in 7 years in patients with these polyps.  However, because of your history of colon cancer, it is recommended that you repeat colonoscopy in 5 years.  The biopsies taken of the mucosa at your anastomosis showed benign reactive changes.  No precancerous or cancerous changes were seen.

## 2024-09-22 ENCOUNTER — Ambulatory Visit: Admitting: Oncology

## 2024-09-22 ENCOUNTER — Other Ambulatory Visit

## 2024-09-23 ENCOUNTER — Ambulatory Visit: Admitting: Oncology

## 2024-09-23 ENCOUNTER — Other Ambulatory Visit
